# Patient Record
Sex: Male | Born: 1960
Health system: Southern US, Community
[De-identification: ages and names within clinical notes are randomized; demographics above are authoritative.]

## PROBLEM LIST (undated history)

## (undated) DIAGNOSIS — D751 Secondary polycythemia: Secondary | ICD-10-CM

## (undated) DIAGNOSIS — I10 Essential (primary) hypertension: Secondary | ICD-10-CM

## (undated) DIAGNOSIS — E119 Type 2 diabetes mellitus without complications: Secondary | ICD-10-CM

## (undated) DIAGNOSIS — E785 Hyperlipidemia, unspecified: Secondary | ICD-10-CM

## (undated) HISTORY — DX: Essential (primary) hypertension: I10

## (undated) HISTORY — DX: Secondary polycythemia: D75.1

## (undated) HISTORY — DX: Hyperlipidemia, unspecified: E78.5

## (undated) HISTORY — DX: Type 2 diabetes mellitus without complications: E11.9

---

## 2003-01-06 ENCOUNTER — Encounter: Admission: RE | Admit: 2003-01-06 | Discharge: 2003-01-06 | Payer: Self-pay | Admitting: Family Medicine

## 2004-10-27 ENCOUNTER — Ambulatory Visit: Payer: Self-pay | Admitting: Family Medicine

## 2004-12-20 ENCOUNTER — Ambulatory Visit: Payer: Self-pay | Admitting: Family Medicine

## 2005-01-10 ENCOUNTER — Ambulatory Visit: Payer: Self-pay | Admitting: Family Medicine

## 2005-02-08 ENCOUNTER — Ambulatory Visit: Payer: Self-pay | Admitting: Family Medicine

## 2005-04-25 ENCOUNTER — Ambulatory Visit: Payer: Self-pay | Admitting: Family Medicine

## 2005-05-02 ENCOUNTER — Ambulatory Visit: Payer: Self-pay | Admitting: Family Medicine

## 2006-05-22 ENCOUNTER — Ambulatory Visit: Payer: Self-pay | Admitting: Family Medicine

## 2006-05-22 LAB — CONVERTED CEMR LAB
ALT: 43 units/L — ABNORMAL HIGH (ref 0–40)
AST: 30 units/L (ref 0–37)
Albumin: 4.1 g/dL (ref 3.5–5.2)
Alkaline Phosphatase: 51 units/L (ref 39–117)
BUN: 16 mg/dL (ref 6–23)
Basophils Absolute: 0 10*3/uL (ref 0.0–0.1)
Basophils Relative: 0.6 % (ref 0.0–1.0)
Bilirubin, Direct: 0.1 mg/dL (ref 0.0–0.3)
CO2: 35 meq/L — ABNORMAL HIGH (ref 19–32)
Calcium: 9.4 mg/dL (ref 8.4–10.5)
Chloride: 103 meq/L (ref 96–112)
Cholesterol: 186 mg/dL (ref 0–200)
Creatinine, Ser: 1.1 mg/dL (ref 0.4–1.5)
Direct LDL: 102.3 mg/dL
Eosinophils Absolute: 0.1 10*3/uL (ref 0.0–0.6)
Eosinophils Relative: 1.7 % (ref 0.0–5.0)
GFR calc Af Amer: 93 mL/min
GFR calc non Af Amer: 77 mL/min
Glucose, Bld: 128 mg/dL — ABNORMAL HIGH (ref 70–99)
HCT: 50.9 % (ref 39.0–52.0)
HDL: 27.8 mg/dL — ABNORMAL LOW (ref >39.0)
Hemoglobin: 17.6 g/dL — ABNORMAL HIGH (ref 13.0–17.0)
Lymphocytes Relative: 23.8 % (ref 12.0–46.0)
MCHC: 34.5 g/dL (ref 30.0–36.0)
MCV: 94.1 fL (ref 78.0–100.0)
Monocytes Absolute: 0.7 10*3/uL (ref 0.2–0.7)
Monocytes Relative: 9.7 % (ref 3.0–11.0)
Neutro Abs: 4.5 10*3/uL (ref 1.4–7.7)
Neutrophils Relative %: 64.2 % (ref 43.0–77.0)
Platelets: 208 10*3/uL (ref 150–400)
Potassium: 3.7 meq/L (ref 3.5–5.1)
RBC: 5.41 M/uL (ref 4.22–5.81)
RDW: 11.5 % (ref 11.5–14.6)
Sodium: 143 meq/L (ref 135–145)
TSH: 1.21 microintl units/mL (ref 0.35–5.50)
Total Bilirubin: 0.9 mg/dL (ref 0.3–1.2)
Total CHOL/HDL Ratio: 6.7
Total Protein: 7.1 g/dL (ref 6.0–8.3)
Triglycerides: 380 mg/dL (ref 0–149)
VLDL: 76 mg/dL — ABNORMAL HIGH (ref 0–40)
WBC: 7 10*3/uL (ref 4.5–10.5)

## 2006-05-29 ENCOUNTER — Ambulatory Visit: Payer: Self-pay | Admitting: Family Medicine

## 2006-07-25 ENCOUNTER — Ambulatory Visit: Payer: Self-pay | Admitting: Family Medicine

## 2006-07-25 LAB — CONVERTED CEMR LAB
BUN: 16 mg/dL (ref 6–23)
CO2: 35 meq/L — ABNORMAL HIGH (ref 19–32)
Calcium: 9.5 mg/dL (ref 8.4–10.5)
Chloride: 102 meq/L (ref 96–112)
Cholesterol: 209 mg/dL (ref 0–200)
Creatinine, Ser: 1 mg/dL (ref 0.4–1.5)
Creatinine,U: 229.8 mg/dL
Direct LDL: 108.6 mg/dL
GFR calc Af Amer: 103 mL/min
GFR calc non Af Amer: 86 mL/min
Glucose, Bld: 102 mg/dL — ABNORMAL HIGH (ref 70–99)
HDL: 26.8 mg/dL — ABNORMAL LOW (ref >39.0)
Hgb A1c MFr Bld: 5.6 % (ref 4.6–6.0)
Microalb Creat Ratio: 3.5 mg/g (ref 0.0–30.0)
Microalb, Ur: 0.8 mg/dL (ref 0.0–1.9)
Potassium: 3.4 meq/L — ABNORMAL LOW (ref 3.5–5.1)
Sodium: 143 meq/L (ref 135–145)
Total CHOL/HDL Ratio: 7.8
Triglycerides: 320 mg/dL (ref 0–149)
VLDL: 64 mg/dL — ABNORMAL HIGH (ref 0–40)

## 2006-09-14 DIAGNOSIS — F17200 Nicotine dependence, unspecified, uncomplicated: Secondary | ICD-10-CM | POA: Insufficient documentation

## 2006-09-14 DIAGNOSIS — I10 Essential (primary) hypertension: Secondary | ICD-10-CM

## 2006-09-14 DIAGNOSIS — D751 Secondary polycythemia: Secondary | ICD-10-CM | POA: Insufficient documentation

## 2006-09-14 HISTORY — DX: Secondary polycythemia: D75.1

## 2006-09-14 HISTORY — DX: Essential (primary) hypertension: I10

## 2006-09-20 ENCOUNTER — Ambulatory Visit: Payer: Self-pay | Admitting: Family Medicine

## 2006-09-20 LAB — CONVERTED CEMR LAB
Cholesterol: 191 mg/dL (ref 0–200)
Direct LDL: 130.1 mg/dL
HDL: 22.5 mg/dL — ABNORMAL LOW (ref >39.0)
Total CHOL/HDL Ratio: 8.5
Triglycerides: 236 mg/dL (ref 0–149)
VLDL: 47 mg/dL — ABNORMAL HIGH (ref 0–40)

## 2006-12-25 ENCOUNTER — Telehealth: Payer: Self-pay | Admitting: Family Medicine

## 2007-05-24 ENCOUNTER — Ambulatory Visit: Payer: Self-pay | Admitting: Family Medicine

## 2007-05-24 LAB — CONVERTED CEMR LAB
ALT: 50 units/L (ref 0–53)
AST: 33 units/L (ref 0–37)
Albumin: 4 g/dL (ref 3.5–5.2)
Alkaline Phosphatase: 48 units/L (ref 39–117)
BUN: 14 mg/dL (ref 6–23)
Basophils Absolute: 0 10*3/uL (ref 0.0–0.1)
Basophils Relative: 0 % (ref 0.0–1.0)
Bilirubin Urine: NEGATIVE
Bilirubin, Direct: 0.1 mg/dL (ref 0.0–0.3)
CO2: 32 meq/L (ref 19–32)
Calcium: 9.4 mg/dL (ref 8.4–10.5)
Chloride: 101 meq/L (ref 96–112)
Cholesterol: 210 mg/dL (ref 0–200)
Creatinine, Ser: 1 mg/dL (ref 0.4–1.5)
Direct LDL: 113.1 mg/dL
Eosinophils Absolute: 0.2 10*3/uL (ref 0.0–0.7)
Eosinophils Relative: 2.6 % (ref 0.0–5.0)
GFR calc Af Amer: 103 mL/min
GFR calc non Af Amer: 85 mL/min
Glucose, Bld: 115 mg/dL — ABNORMAL HIGH (ref 70–99)
HCT: 46.7 % (ref 39.0–52.0)
HDL: 23.9 mg/dL — ABNORMAL LOW (ref >39.0)
Hemoglobin: 16.2 g/dL (ref 13.0–17.0)
Ketones, urine, test strip: NEGATIVE
Lymphocytes Relative: 31.4 % (ref 12.0–46.0)
MCHC: 34.7 g/dL (ref 30.0–36.0)
MCV: 96.5 fL (ref 78.0–100.0)
Monocytes Absolute: 0.6 10*3/uL (ref 0.1–1.0)
Monocytes Relative: 10.2 % (ref 3.0–12.0)
Neutro Abs: 3.2 10*3/uL (ref 1.4–7.7)
Neutrophils Relative %: 55.8 % (ref 43.0–77.0)
Nitrite: NEGATIVE
Platelets: 217 10*3/uL (ref 150–400)
Potassium: 3.1 meq/L — ABNORMAL LOW (ref 3.5–5.1)
Protein, U semiquant: NEGATIVE
RBC: 4.84 M/uL (ref 4.22–5.81)
RDW: 11.7 % (ref 11.5–14.6)
Sodium: 140 meq/L (ref 135–145)
Specific Gravity, Urine: >=1.03
TSH: 1.6 microintl units/mL (ref 0.35–5.50)
Total Bilirubin: 0.8 mg/dL (ref 0.3–1.2)
Total CHOL/HDL Ratio: 8.8
Total Protein: 7 g/dL (ref 6.0–8.3)
Triglycerides: 491 mg/dL (ref 0–149)
Urobilinogen, UA: 1
VLDL: 98 mg/dL — ABNORMAL HIGH (ref 0–40)
WBC Urine, dipstick: NEGATIVE
WBC: 5.9 10*3/uL (ref 4.5–10.5)
pH: 5.5

## 2007-06-25 ENCOUNTER — Encounter: Payer: Self-pay | Admitting: Family Medicine

## 2007-06-26 ENCOUNTER — Ambulatory Visit: Payer: Self-pay | Admitting: Family Medicine

## 2007-06-26 DIAGNOSIS — M2550 Pain in unspecified joint: Secondary | ICD-10-CM | POA: Insufficient documentation

## 2007-06-26 DIAGNOSIS — M25519 Pain in unspecified shoulder: Secondary | ICD-10-CM | POA: Insufficient documentation

## 2007-09-26 ENCOUNTER — Telehealth: Payer: Self-pay | Admitting: Family Medicine

## 2007-10-19 ENCOUNTER — Ambulatory Visit: Payer: Self-pay | Admitting: Family Medicine

## 2007-10-19 LAB — CONVERTED CEMR LAB
Cholesterol: 183 mg/dL (ref 0–200)
Direct LDL: 123.3 mg/dL
HDL: 20.9 mg/dL — ABNORMAL LOW (ref >39.0)
Total CHOL/HDL Ratio: 8.8
Triglycerides: 219 mg/dL (ref 0–149)
VLDL: 44 mg/dL — ABNORMAL HIGH (ref 0–40)

## 2007-10-25 ENCOUNTER — Ambulatory Visit: Payer: Self-pay | Admitting: Family Medicine

## 2007-10-25 DIAGNOSIS — E785 Hyperlipidemia, unspecified: Secondary | ICD-10-CM | POA: Insufficient documentation

## 2008-02-15 ENCOUNTER — Ambulatory Visit: Payer: Self-pay | Admitting: Family Medicine

## 2008-02-15 LAB — CONVERTED CEMR LAB
Basophils Absolute: 0.1 10*3/uL (ref 0.0–0.1)
Basophils Relative: 1 % (ref 0.0–3.0)
Cholesterol: 207 mg/dL (ref 0–200)
Direct LDL: 146.3 mg/dL
Eosinophils Absolute: 0.2 10*3/uL (ref 0.0–0.7)
Eosinophils Relative: 2.9 % (ref 0.0–5.0)
HCT: 48 % (ref 39.0–52.0)
HDL: 23.2 mg/dL — ABNORMAL LOW (ref >39.0)
Hemoglobin: 16.7 g/dL (ref 13.0–17.0)
Lymphocytes Relative: 28.1 % (ref 12.0–46.0)
MCHC: 34.7 g/dL (ref 30.0–36.0)
MCV: 94.5 fL (ref 78.0–100.0)
Monocytes Absolute: 0.5 10*3/uL (ref 0.1–1.0)
Monocytes Relative: 8.6 % (ref 3.0–12.0)
Neutro Abs: 3.7 10*3/uL (ref 1.4–7.7)
Neutrophils Relative %: 59.4 % (ref 43.0–77.0)
Platelets: 206 10*3/uL (ref 150–400)
RBC: 5.07 M/uL (ref 4.22–5.81)
RDW: 11.5 % (ref 11.5–14.6)
Total CHOL/HDL Ratio: 8.9
Triglycerides: 246 mg/dL (ref 0–149)
VLDL: 49 mg/dL — ABNORMAL HIGH (ref 0–40)
WBC: 6.2 10*3/uL (ref 4.5–10.5)

## 2008-02-22 ENCOUNTER — Ambulatory Visit: Payer: Self-pay | Admitting: Family Medicine

## 2008-03-31 ENCOUNTER — Telehealth: Payer: Self-pay | Admitting: Family Medicine

## 2008-06-27 ENCOUNTER — Ambulatory Visit: Payer: Self-pay | Admitting: Family Medicine

## 2008-06-27 LAB — CONVERTED CEMR LAB
ALT: 55 units/L — ABNORMAL HIGH (ref 0–53)
AST: 36 units/L (ref 0–37)
Albumin: 4.1 g/dL (ref 3.5–5.2)
Alkaline Phosphatase: 45 units/L (ref 39–117)
BUN: 17 mg/dL (ref 6–23)
Basophils Absolute: 0 10*3/uL (ref 0.0–0.1)
Basophils Relative: 0.6 % (ref 0.0–3.0)
Bilirubin Urine: NEGATIVE
Bilirubin, Direct: 0.1 mg/dL (ref 0.0–0.3)
Blood in Urine, dipstick: NEGATIVE
CO2: 30 meq/L (ref 19–32)
Calcium: 8.9 mg/dL (ref 8.4–10.5)
Chloride: 105 meq/L (ref 96–112)
Cholesterol: 182 mg/dL (ref 0–200)
Creatinine, Ser: 1.2 mg/dL (ref 0.4–1.5)
Direct LDL: 128.1 mg/dL
Eosinophils Absolute: 0.2 10*3/uL (ref 0.0–0.7)
Eosinophils Relative: 3.7 % (ref 0.0–5.0)
GFR calc non Af Amer: 68.5 mL/min (ref >60)
Glucose, Bld: 107 mg/dL — ABNORMAL HIGH (ref 70–99)
Glucose, Urine, Semiquant: NEGATIVE
HCT: 46.2 % (ref 39.0–52.0)
HDL: 22.9 mg/dL — ABNORMAL LOW (ref >39.00)
Hemoglobin: 16.4 g/dL (ref 13.0–17.0)
Lymphocytes Relative: 29.6 % (ref 12.0–46.0)
Lymphs Abs: 1.7 10*3/uL (ref 0.7–4.0)
MCHC: 35.5 g/dL (ref 30.0–36.0)
MCV: 94.9 fL (ref 78.0–100.0)
Monocytes Absolute: 0.6 10*3/uL (ref 0.1–1.0)
Monocytes Relative: 10.2 % (ref 3.0–12.0)
Neutro Abs: 3.1 10*3/uL (ref 1.4–7.7)
Neutrophils Relative %: 55.9 % (ref 43.0–77.0)
Nitrite: NEGATIVE
PSA: 0.48 ng/mL (ref 0.10–4.00)
Platelets: 199 10*3/uL (ref 150.0–400.0)
Potassium: 3.2 meq/L — ABNORMAL LOW (ref 3.5–5.1)
RBC: 4.86 M/uL (ref 4.22–5.81)
RDW: 11.7 % (ref 11.5–14.6)
Sodium: 142 meq/L (ref 135–145)
Specific Gravity, Urine: 1.025
TSH: 1.33 microintl units/mL (ref 0.35–5.50)
Total Bilirubin: 0.7 mg/dL (ref 0.3–1.2)
Total CHOL/HDL Ratio: 8
Total Protein: 6.7 g/dL (ref 6.0–8.3)
Triglycerides: 232 mg/dL — ABNORMAL HIGH (ref 0.0–149.0)
Urobilinogen, UA: 1
VLDL: 46.4 mg/dL — ABNORMAL HIGH (ref 0.0–40.0)
WBC Urine, dipstick: NEGATIVE
WBC: 5.6 10*3/uL (ref 4.5–10.5)
pH: 5.5

## 2008-07-04 ENCOUNTER — Ambulatory Visit: Payer: Self-pay | Admitting: Family Medicine

## 2008-07-04 DIAGNOSIS — L600 Ingrowing nail: Secondary | ICD-10-CM | POA: Insufficient documentation

## 2008-07-08 ENCOUNTER — Telehealth: Payer: Self-pay | Admitting: *Deleted

## 2008-07-30 ENCOUNTER — Telehealth: Payer: Self-pay | Admitting: Family Medicine

## 2008-09-04 ENCOUNTER — Emergency Department (HOSPITAL_BASED_OUTPATIENT_CLINIC_OR_DEPARTMENT_OTHER): Admission: EM | Admit: 2008-09-04 | Discharge: 2008-09-04 | Payer: Self-pay | Admitting: Emergency Medicine

## 2008-09-04 ENCOUNTER — Ambulatory Visit: Payer: Self-pay | Admitting: Radiology

## 2008-09-04 ENCOUNTER — Telehealth: Payer: Self-pay | Admitting: Family Medicine

## 2009-02-27 ENCOUNTER — Telehealth: Payer: Self-pay | Admitting: Family Medicine

## 2009-04-09 ENCOUNTER — Telehealth: Payer: Self-pay | Admitting: Family Medicine

## 2009-06-30 ENCOUNTER — Ambulatory Visit: Payer: Self-pay | Admitting: Family Medicine

## 2009-06-30 LAB — CONVERTED CEMR LAB
ALT: 36 units/L (ref 0–53)
AST: 23 units/L (ref 0–37)
Albumin: 4.2 g/dL (ref 3.5–5.2)
Alkaline Phosphatase: 46 units/L (ref 39–117)
BUN: 17 mg/dL (ref 6–23)
Basophils Absolute: 0 10*3/uL (ref 0.0–0.1)
Basophils Relative: 0.7 % (ref 0.0–3.0)
Bilirubin Urine: NEGATIVE
Bilirubin, Direct: 0.1 mg/dL (ref 0.0–0.3)
Blood in Urine, dipstick: NEGATIVE
CO2: 31 meq/L (ref 19–32)
Calcium: 9.1 mg/dL (ref 8.4–10.5)
Chloride: 102 meq/L (ref 96–112)
Cholesterol: 188 mg/dL (ref 0–200)
Creatinine, Ser: 1 mg/dL (ref 0.4–1.5)
Direct LDL: 132.1 mg/dL
Eosinophils Absolute: 0.3 10*3/uL (ref 0.0–0.7)
Eosinophils Relative: 4.6 % (ref 0.0–5.0)
GFR calc non Af Amer: 88.26 mL/min (ref >60)
Glucose, Bld: 106 mg/dL — ABNORMAL HIGH (ref 70–99)
Glucose, Urine, Semiquant: NEGATIVE
HCT: 48.5 % (ref 39.0–52.0)
HDL: 28.3 mg/dL — ABNORMAL LOW (ref >39.00)
Hemoglobin: 16.8 g/dL (ref 13.0–17.0)
Ketones, urine, test strip: NEGATIVE
Lymphocytes Relative: 28.8 % (ref 12.0–46.0)
Lymphs Abs: 1.7 10*3/uL (ref 0.7–4.0)
MCHC: 34.8 g/dL (ref 30.0–36.0)
MCV: 95.3 fL (ref 78.0–100.0)
Monocytes Absolute: 0.6 10*3/uL (ref 0.1–1.0)
Monocytes Relative: 10.1 % (ref 3.0–12.0)
Neutro Abs: 3.4 10*3/uL (ref 1.4–7.7)
Neutrophils Relative %: 55.8 % (ref 43.0–77.0)
Nitrite: NEGATIVE
PSA: 0.78 ng/mL (ref 0.10–4.00)
Platelets: 210 10*3/uL (ref 150.0–400.0)
Potassium: 3.7 meq/L (ref 3.5–5.1)
RBC: 5.09 M/uL (ref 4.22–5.81)
RDW: 12.7 % (ref 11.5–14.6)
Sodium: 139 meq/L (ref 135–145)
Specific Gravity, Urine: 1.025
TSH: 1.77 microintl units/mL (ref 0.35–5.50)
Total Bilirubin: 0.8 mg/dL (ref 0.3–1.2)
Total CHOL/HDL Ratio: 7
Total Protein: 6.7 g/dL (ref 6.0–8.3)
Triglycerides: 260 mg/dL — ABNORMAL HIGH (ref 0.0–149.0)
Urobilinogen, UA: 1
VLDL: 52 mg/dL — ABNORMAL HIGH (ref 0.0–40.0)
WBC Urine, dipstick: NEGATIVE
WBC: 6 10*3/uL (ref 4.5–10.5)
pH: 6.5

## 2009-07-10 ENCOUNTER — Ambulatory Visit: Payer: Self-pay | Admitting: Family Medicine

## 2010-03-09 NOTE — Assessment & Plan Note (Signed)
Summary: CPX // RS/PT [facility] FROM BMP/C[facility]   Vital Signs:  Patient profile:   50 year old male Height:      69 inches Weight:      232 pounds BMI:     34.38 Temp:     99.0 degrees F oral BP sitting:   140 / 90  (left arm) Cuff size:   regular  Vitals Entered By: Kern Reap CMA Duncan Dull) (July 10, 2009 10:40 AM) CC: cpx   CC:  cpx.  History of Present Illness: Ralph Hurst is a 50 year old, married male, smoker........... one half pack of cigarettes per day....... who comes in today for evaluation of hypertension, hyperlipidemia, and tobacco abuse.  His hypertension is treated with clonidine, .1 daily, Tenormin, 100 mg daily, Dyazide, one daily.  BP today 140/90.  He will do some blood pressure monitoring at home to be sure his blood pressure is normal.  He also takes Lopid 600 mg daily for hypertriglyceridemia.  He tried the chantix program to stop smoking.  However, the medication gave him nightmares.  We discussed for his options, including cutting the dose way back and taking it every other day.  Review of systems negative.  Tetanus booster 2007  Allergies (verified): No Known Drug Allergies  Past History:  Past medical, surgical, family and social histories (including risk factors) reviewed, and no changes noted (except as noted below).  Past Medical History: Reviewed history from 10/25/2007 and no changes required. Hypertension tobacco abuse polycythemia  low HDL syndrome, high triglyceride  Family History: Reviewed history from 06/26/2007 and no changes required.  father has hypertension mother has diabetes, hypertension, and late onset asthmaone brother one sister, both in good health  Social History: Reviewed history from 06/26/2007 and no changes required. Occupation: Married Current Smoker Alcohol use-no Drug use-no Regular exercise-yes originally from [place], [place] married children are grown and gone.  There are two children  Review of Systems  See HPI  Physical Exam  General:  Well-developed,well-nourished,in no acute distress; alert,appropriate and cooperative throughout examination Head:  Normocephalic and atraumatic without obvious abnormalities. No apparent alopecia or balding. Eyes:  No corneal or conjunctival inflammation noted. EOMI. Perrla. Funduscopic exam benign, without hemorrhages, exudates or papilledema. Vision grossly normal. Ears:  External ear exam shows no significant lesions or deformities.  Otoscopic examination reveals clear canals, tympanic membranes are intact bilaterally without bulging, retraction, inflammation or discharge. Hearing is grossly normal bilaterally. Nose:  External nasal examination shows no deformity or inflammation. Nasal mucosa are pink and moist without lesions or exudates. Mouth:  Oral mucosa and oropharynx without lesions or exudates.  Teeth in good repair. Neck:  No deformities, masses, or tenderness noted. Chest Wall:  No deformities, masses, tenderness or gynecomastia noted. Breasts:  No masses or gynecomastia noted Lungs:  Normal respiratory effort, chest expands symmetrically. Lungs are clear to auscultation, no crackles or wheezes. Heart:  Normal rate and regular rhythm. S1 and S2 normal without gallop, murmur, click, rub or other extra sounds. Abdomen:  Bowel sounds positive,abdomen soft and non-tender without masses, organomegaly or hernias noted. Rectal:  No external abnormalities noted. Normal sphincter tone. No rectal masses or tenderness. Genitalia:  Testes bilaterally descended without nodularity, tenderness or masses. No scrotal masses or lesions. No penis lesions or urethral discharge. Prostate:  Prostate gland firm and smooth, no enlargement, nodularity, tenderness, mass, asymmetry or induration. Msk:  No deformity or scoliosis noted of thoracic or lumbar spine.   Pulses:  R and L carotid,radial,femoral,dorsalis pedis and  posterior tibial pulses are full and equal  bilaterally Extremities:  No clubbing, cyanosis, edema, or deformity noted with normal full range of motion of all joints.   Neurologic:  No cranial nerve deficits noted. Station and gait are normal. Plantar reflexes are down-going bilaterally. DTRs are symmetrical throughout. Sensory, motor and coordinative functions appear intact. Skin:  Intact without suspicious lesions or rashes Cervical Nodes:  No lymphadenopathy noted Axillary Nodes:  No palpable lymphadenopathy Inguinal Nodes:  No significant adenopathy Psych:  Cognition and judgment appear intact. Alert and cooperative with normal attention span and concentration. No apparent delusions, illusions, hallucinations   Impression & Recommendations:  Problem # 1:  PHYSICAL EXAMINATION (ICD-V70.0) Assessment Unchanged  Orders: Prescription Created Electronically [phone])  Problem # 2:  TOBACCO ABUSE (ICD-305.1) Assessment: Unchanged  Orders: Prescription Created Electronically [id number])  Problem # 3:  HYPERLIPIDEMIA, WITH LOW HDL (ICD-272.4) Assessment: Unchanged  The following medications were removed from the medication list:    Trilipix 135 Mg Cpdr (Choline fenofibrate) .Marland Kitchen... Take 1 tablet by mouth every morning His updated medication list for this problem includes:    Lopid 600 Mg Tabs (Gemfibrozil) .Marland Kitchen... Take one tab two times a day  Orders: Prescription Created Electronically [phone]) EKG w/ Interpretation (93000)  Problem # 4:  HYPERTENSION (ICD-401.9) Assessment: Improved  His updated medication list for this problem includes:    Clonidine Hcl 0.1 Mg Tabs (Clonidine hcl) .Marland Kitchen... Take one every morning    Tenormin 100 Mg Tabs (Atenolol) .Marland Kitchen... Take 1 tablet by mouth every morning    Dyazide 37.5-25 Mg Caps (Triamterene-hctz) .Marland Kitchen... Take one tab once daily  Orders: Prescription Created Electronically [phone]) EKG w/ Interpretation (93000)  Complete Medication List: 1)  Clonidine Hcl 0.1 Mg Tabs (Clonidine hcl) .... Take  one every morning 2)  Aspirin 81 Mg Tbec (Aspirin) .... Once daily 3)  Tenormin 100 Mg Tabs (Atenolol) .... Take 1 tablet by mouth every morning 4)  Dyazide 37.5-25 Mg Caps (Triamterene-hctz) .... Take one tab once daily 5)  Lopid 600 Mg Tabs (Gemfibrozil) .... Take one tab two times a day  Patient Instructions: 1)  continue your current medications.  Check your blood pressure daily in the morning for 3 weeks.  If the data shows your blood pressure is 135/85 or less than we are at goal.  If not bring the data and the device and come back for follow-up. 2)  Try to restart the chantix by taking a half a tablet at bedtime, Monday, Wednesday, Friday, for 4 to 6 weeks.  Call p.r.n. 3)  Please schedule a follow-up appointment in 1 year. 4)  Take an Aspirin every day. Prescriptions: LOPID 600 MG TABS (GEMFIBROZIL) take one tab two times a day  #180 x 3   Entered and Authorized by:   Roderick Pee MD   Signed by:   Roderick Pee MD on 07/10/2009   Method used:   Electronically to        [facility] # [id number]* (retail)       [address]       [place], [place]  [address]       Ph: [phone]       Fax: [phone]   RxID:   [id number] DYAZIDE 37.5-25 MG CAPS (TRIAMTERENE-HCTZ) take one tab once daily  #100 x 3   Entered and Authorized by:   Roderick Pee MD   Signed by:   Roderick Pee MD on 07/10/2009   Method used:  Electronically to        [facility] # [address] (retail)       [address]       [place], [place]  [address]       Ph: [phone]       Fax: [phone]   RxID:   [id number] TENORMIN 100 MG TABS (ATENOLOL) Take 1 tablet by mouth every morning  #100 x 3   Entered and Authorized by:   Roderick Pee MD   Signed by:   Roderick Pee MD on 07/10/2009   Method used:   Electronically to        [facility] # [phone]* (retail)       [address]       [place], [place]  [address]       Ph: [phone]       Fax:  [phone]   RxID:   [id number] CLONIDINE HCL 0.1 MG  TABS (CLONIDINE HCL) take one every morning  #100 x 3   Entered and Authorized by:   Roderick Pee MD   Signed by:   Roderick Pee MD on 07/10/2009   Method used:   Electronically to        [facility] # [address]* (retail)       [address]       [place], [place]  [address]       Ph: [phone]       Fax: [phone]   RxID:   [id number]

## 2010-03-09 NOTE — Progress Notes (Signed)
Summary: new rx  Phone Note Call from Patient   Caller: Patient Call For: Roderick Pee MD Reason for Call: Refill Medication Details for Reason: new rx Summary of Call: patient is calling because he has spoken with the pharmacy and lopid is a medication he would like to try for his lipids.  is this okay to fill? Initial call taken by: Kern Reap CMA Duncan Dull),  April 09, 2009 11:51 AM  Follow-up for Phone Call        okay for years supply........ check does Follow-up by: Roderick Pee MD,  April 09, 2009 12:45 PM    New/Updated Medications: LOPID 600 MG TABS (GEMFIBROZIL) take one tab two times a day Prescriptions: LOPID 600 MG TABS (GEMFIBROZIL) take one tab two times a day  #180 x 0   Entered by:   Kern Reap CMA (AAMA)   Authorized by:   Roderick Pee MD   Signed by:   Kern Reap CMA (AAMA) on 04/09/2009   Method used:   Telephoned to ...       [facility] # [address] (retail)       [address]       [place], [place]  [address]       Ph: [phone]       Fax: [phone]   RxID:   [id number]

## 2010-03-09 NOTE — Progress Notes (Signed)
Summary: change meds  Phone Note Call from Patient   Caller: Patient Call For: Roderick Pee MD Summary of Call: Pt. cannot afford the Trilipix without the discount cards.  What else can he take? [phone])  Initial call taken by: Lynann Beaver CMA,  February 27, 2009 8:55 AM  Follow-up for Phone Call        have patient call the pharmacy benefit network with his insurance to find out what medication is equivalent and how much it costs Follow-up by: Roderick Pee MD,  March 02, 2009 8:34 AM  Additional Follow-up for Phone Call Additional follow up Details #1::        Left message on pt's voice mail with Dr. Nelida Meuse suggestions. Additional Follow-up by: Lynann Beaver CMA,  March 02, 2009 10:56 AM

## 2010-06-20 ENCOUNTER — Other Ambulatory Visit: Payer: Self-pay | Admitting: Family Medicine

## 2010-07-01 ENCOUNTER — Other Ambulatory Visit (INDEPENDENT_AMBULATORY_CARE_PROVIDER_SITE_OTHER): Payer: BC Managed Care – PPO

## 2010-07-01 DIAGNOSIS — Z0000 Encounter for general adult medical examination without abnormal findings: Secondary | ICD-10-CM

## 2010-07-01 LAB — CBC WITH DIFFERENTIAL/PLATELET
Basophils Absolute: 0 10*3/uL (ref 0.0–0.1)
Basophils Relative: 0.8 % (ref 0.0–3.0)
Eosinophils Absolute: 0.2 10*3/uL (ref 0.0–0.7)
Eosinophils Relative: 4.2 % (ref 0.0–5.0)
HCT: 47.2 % (ref 39.0–52.0)
Hemoglobin: 16.4 g/dL (ref 13.0–17.0)
Lymphocytes Relative: 28.5 % (ref 12.0–46.0)
Lymphs Abs: 1.4 10*3/uL (ref 0.7–4.0)
MCHC: 34.7 g/dL (ref 30.0–36.0)
MCV: 96.4 fl (ref 78.0–100.0)
Monocytes Absolute: 0.7 10*3/uL (ref 0.1–1.0)
Monocytes Relative: 14.5 % — ABNORMAL HIGH (ref 3.0–12.0)
Neutro Abs: 2.5 10*3/uL (ref 1.4–7.7)
Neutrophils Relative %: 52 % (ref 43.0–77.0)
Platelets: 191 10*3/uL (ref 150.0–400.0)
RBC: 4.9 Mil/uL (ref 4.22–5.81)
RDW: 13 % (ref 11.5–14.6)
WBC: 4.9 10*3/uL (ref 4.5–10.5)

## 2010-07-01 LAB — POCT URINALYSIS DIPSTICK
Bilirubin, UA: NEGATIVE
Blood, UA: NEGATIVE
Glucose, UA: NEGATIVE
Ketones, UA: NEGATIVE
Leukocytes, UA: NEGATIVE
Nitrite, UA: NEGATIVE
Spec Grav, UA: 1.025
Urobilinogen, UA: 1
pH, UA: 7

## 2010-07-01 LAB — BASIC METABOLIC PANEL
BUN: 17 mg/dL (ref 6–23)
CO2: 29 mEq/L (ref 19–32)
Calcium: 9.2 mg/dL (ref 8.4–10.5)
Chloride: 105 mEq/L (ref 96–112)
Creatinine, Ser: 1.1 mg/dL (ref 0.4–1.5)
GFR: 76.73 mL/min (ref >60.00)
Glucose, Bld: 139 mg/dL — ABNORMAL HIGH (ref 70–99)
Potassium: 4.1 mEq/L (ref 3.5–5.1)
Sodium: 142 mEq/L (ref 135–145)

## 2010-07-01 LAB — LIPID PANEL
Cholesterol: 175 mg/dL (ref 0–200)
HDL: 30 mg/dL — ABNORMAL LOW (ref >39.00)
Total CHOL/HDL Ratio: 6
Triglycerides: 291 mg/dL — ABNORMAL HIGH (ref 0.0–149.0)
VLDL: 58.2 mg/dL — ABNORMAL HIGH (ref 0.0–40.0)

## 2010-07-01 LAB — HEPATIC FUNCTION PANEL
ALT: 62 U/L — ABNORMAL HIGH (ref 0–53)
AST: 39 U/L — ABNORMAL HIGH (ref 0–37)
Albumin: 4 g/dL (ref 3.5–5.2)
Alkaline Phosphatase: 43 U/L (ref 39–117)
Bilirubin, Direct: 0.1 mg/dL (ref 0.0–0.3)
Total Bilirubin: 0.8 mg/dL (ref 0.3–1.2)
Total Protein: 6.7 g/dL (ref 6.0–8.3)

## 2010-07-01 LAB — PSA: PSA: 0.63 ng/mL (ref 0.10–4.00)

## 2010-07-01 LAB — TSH: TSH: 1.48 u[IU]/mL (ref 0.35–5.50)

## 2010-07-02 LAB — LDL CHOLESTEROL, DIRECT: Direct LDL: 120.2 mg/dL

## 2010-07-15 ENCOUNTER — Encounter: Payer: Self-pay | Admitting: Family Medicine

## 2010-07-17 ENCOUNTER — Other Ambulatory Visit: Payer: Self-pay | Admitting: Family Medicine

## 2010-07-28 ENCOUNTER — Encounter: Payer: Self-pay | Admitting: Family Medicine

## 2010-07-29 ENCOUNTER — Encounter: Payer: Self-pay | Admitting: Family Medicine

## 2010-07-29 ENCOUNTER — Ambulatory Visit (INDEPENDENT_AMBULATORY_CARE_PROVIDER_SITE_OTHER): Payer: BC Managed Care – PPO | Admitting: Family Medicine

## 2010-07-29 DIAGNOSIS — I10 Essential (primary) hypertension: Secondary | ICD-10-CM

## 2010-07-29 DIAGNOSIS — F17200 Nicotine dependence, unspecified, uncomplicated: Secondary | ICD-10-CM

## 2010-07-29 DIAGNOSIS — E785 Hyperlipidemia, unspecified: Secondary | ICD-10-CM

## 2010-07-29 MED ORDER — GEMFIBROZIL 600 MG PO TABS: 600.0000 mg | ORAL_TABLET | Freq: Two times a day (BID) | ORAL | Status: DC

## 2010-07-29 MED ORDER — CLONIDINE HCL 0.1 MG PO TABS: 0.1000 mg | ORAL_TABLET | ORAL | Status: DC

## 2010-07-29 MED ORDER — ATENOLOL 100 MG PO TABS: 100.0000 mg | ORAL_TABLET | Freq: Every day | ORAL | Status: DC

## 2010-07-29 MED ORDER — TRIAMTERENE-HCTZ 37.5-25 MG PO CAPS: 1.0000 | ORAL_CAPSULE | ORAL | Status: DC

## 2010-07-29 NOTE — Progress Notes (Signed)
Subjective:    Patient ID: Ralph Hurst, male    DOB: [birth year], 50 y.o.   MRN: [id number]  HPI [name] Is a 50 year old,  married male smoker, who comes in today for general physical examination because of history of hypertension, hyperlipidemia, and tobacco abuse.  We tried the chantix program, however, it caused strange dreams, nightmares a lot of other CNS side effects.  Advise try half a tablet Monday, Wednesday, Friday or switched to the patches or the gums.  He takes Tenormin 100 mg daily and Catapres .1 daily for hypertension.  BP good 130/90.  He takes Lopid 600 mg b.i.d. For hyperlipidemia lipids are okay except his HDL is low.  Advise fish oil, and exercise.  He has a bump on his left lower eyelid.  A couple weeks ago.  He had some flank pain that lasted for a day or two and then went away.  He does not get routine eye care and her general care.  He is due  for a screening colonoscopy.  Tetanus 2007   Review of Systems  Constitutional: Negative.   HENT: Negative.   Eyes: Negative.   Respiratory: Negative.   Cardiovascular: Negative.   Gastrointestinal: Negative.   Genitourinary: Negative.   Musculoskeletal: Negative.   Skin: Negative.   Neurological: Negative.   Hematological: Negative.   Psychiatric/Behavioral: Negative.        Objective:   Physical Exam  Constitutional: He is oriented to person, place, and time. He appears well-developed and well-nourished.  HENT:  Head: Normocephalic and atraumatic.  Right Ear: External ear normal.  Left Ear: External ear normal.  Nose: Nose normal.  Mouth/Throat: Oropharynx is clear and moist.  Eyes: Conjunctivae and EOM are normal. Pupils are equal, round, and reactive to light.  Neck: Normal range of motion. Neck supple. No JVD present. No tracheal deviation present. No thyromegaly present.  Cardiovascular: Normal rate, regular rhythm, normal heart sounds and intact distal pulses.  Exam reveals no gallop and no  friction rub.   No murmur heard. Pulmonary/Chest: Effort normal and breath sounds normal. No stridor. No respiratory distress. He has no wheezes. He has no rales. He exhibits no tenderness.  Abdominal: Soft. Bowel sounds are normal. He exhibits no distension and no mass. There is no tenderness. There is no rebound and no guarding.  Genitourinary: Rectum normal, prostate normal and penis normal. Guaiac negative stool. No penile tenderness.  Musculoskeletal: Normal range of motion. He exhibits no edema and no tenderness.  Lymphadenopathy:    He has no cervical adenopathy.  Neurological: He is alert and oriented to person, place, and time. He has normal reflexes. No cranial nerve deficit. He exhibits normal muscle tone.  Skin: Skin is warm and dry. No rash noted. No erythema. No pallor.  Psychiatric: He has a normal mood and affect. His behavior is normal. Judgment and thought content normal.          Assessment & Plan:  Hypertension continue current medications  Hyperlipidemia.  Continue Lopid, 600 b.i.d., an aspirin tablet daily.  Two lazy in left eye observed.  Ophthalmology consult.  Tobacco abuse advised different strategies to stop smoking

## 2010-07-29 NOTE — Patient Instructions (Signed)
Continue your current medications.  Retry the . chantix program by taking a half a tablet Monday, Wednesday, Friday if you still have significant side effects, then switched to the nicotine gum or the nicotine patches Return in one year, sooner if any problems.  If the lesion in y eye  does not heal, then I would recommend Dr. Mia Creek, ophthalmologist.  Also, recommend Dr. Alvester Morin ........... dentist

## 2010-09-13 ENCOUNTER — Other Ambulatory Visit: Payer: Self-pay | Admitting: Family Medicine

## 2010-09-27 ENCOUNTER — Encounter: Payer: Self-pay | Admitting: Gastroenterology

## 2011-03-06 ENCOUNTER — Other Ambulatory Visit: Payer: Self-pay | Admitting: Family Medicine

## 2011-06-05 ENCOUNTER — Other Ambulatory Visit: Payer: Self-pay | Admitting: Family Medicine

## 2011-07-30 ENCOUNTER — Other Ambulatory Visit: Payer: Self-pay | Admitting: Family Medicine

## 2011-07-31 ENCOUNTER — Other Ambulatory Visit: Payer: Self-pay | Admitting: Family Medicine

## 2011-10-04 ENCOUNTER — Other Ambulatory Visit (INDEPENDENT_AMBULATORY_CARE_PROVIDER_SITE_OTHER): Payer: BC Managed Care – PPO

## 2011-10-04 DIAGNOSIS — Z0000 Encounter for general adult medical examination without abnormal findings: Secondary | ICD-10-CM

## 2011-10-04 LAB — CBC WITH DIFFERENTIAL/PLATELET
Basophils Absolute: 0 10*3/uL (ref 0.0–0.1)
Basophils Relative: 0.5 % (ref 0.0–3.0)
Eosinophils Absolute: 0.2 10*3/uL (ref 0.0–0.7)
Eosinophils Relative: 2.7 % (ref 0.0–5.0)
HCT: 51 % (ref 39.0–52.0)
Hemoglobin: 17.2 g/dL — ABNORMAL HIGH (ref 13.0–17.0)
Lymphocytes Relative: 27.7 % (ref 12.0–46.0)
Lymphs Abs: 1.8 10*3/uL (ref 0.7–4.0)
MCHC: 33.6 g/dL (ref 30.0–36.0)
MCV: 97.2 fl (ref 78.0–100.0)
Monocytes Absolute: 0.7 10*3/uL (ref 0.1–1.0)
Monocytes Relative: 10.4 % (ref 3.0–12.0)
Neutro Abs: 3.9 10*3/uL (ref 1.4–7.7)
Neutrophils Relative %: 58.7 % (ref 43.0–77.0)
Platelets: 195 10*3/uL (ref 150.0–400.0)
RBC: 5.25 Mil/uL (ref 4.22–5.81)
RDW: 12.7 % (ref 11.5–14.6)
WBC: 6.6 10*3/uL (ref 4.5–10.5)

## 2011-10-04 LAB — PSA: PSA: 0.66 ng/mL (ref 0.10–4.00)

## 2011-10-04 LAB — POCT URINALYSIS DIPSTICK
Bilirubin, UA: NEGATIVE
Blood, UA: NEGATIVE
Glucose, UA: NEGATIVE
Ketones, UA: NEGATIVE
Leukocytes, UA: NEGATIVE
Nitrite, UA: NEGATIVE
Spec Grav, UA: 1.025
Urobilinogen, UA: 1
pH, UA: 6

## 2011-10-04 LAB — BASIC METABOLIC PANEL
BUN: 14 mg/dL (ref 6–23)
CO2: 31 mEq/L (ref 19–32)
Calcium: 9.3 mg/dL (ref 8.4–10.5)
Chloride: 101 mEq/L (ref 96–112)
Creatinine, Ser: 1 mg/dL (ref 0.4–1.5)
GFR: 86.42 mL/min (ref >60.00)
Glucose, Bld: 125 mg/dL — ABNORMAL HIGH (ref 70–99)
Potassium: 4.2 mEq/L (ref 3.5–5.1)
Sodium: 141 mEq/L (ref 135–145)

## 2011-10-04 LAB — LIPID PANEL
Cholesterol: 186 mg/dL (ref 0–200)
HDL: 29.5 mg/dL — ABNORMAL LOW (ref >39.00)
Total CHOL/HDL Ratio: 6
Triglycerides: 345 mg/dL — ABNORMAL HIGH (ref 0.0–149.0)
VLDL: 69 mg/dL — ABNORMAL HIGH (ref 0.0–40.0)

## 2011-10-04 LAB — HEPATIC FUNCTION PANEL
ALT: 43 U/L (ref 0–53)
AST: 24 U/L (ref 0–37)
Albumin: 4.2 g/dL (ref 3.5–5.2)
Alkaline Phosphatase: 52 U/L (ref 39–117)
Bilirubin, Direct: 0.1 mg/dL (ref 0.0–0.3)
Total Bilirubin: 0.7 mg/dL (ref 0.3–1.2)
Total Protein: 6.9 g/dL (ref 6.0–8.3)

## 2011-10-04 LAB — TSH: TSH: 1.41 u[IU]/mL (ref 0.35–5.50)

## 2011-10-07 ENCOUNTER — Other Ambulatory Visit: Payer: Self-pay | Admitting: Family Medicine

## 2011-10-12 ENCOUNTER — Ambulatory Visit (INDEPENDENT_AMBULATORY_CARE_PROVIDER_SITE_OTHER): Payer: BC Managed Care – PPO | Admitting: Family Medicine

## 2011-10-12 ENCOUNTER — Encounter: Payer: Self-pay | Admitting: Family Medicine

## 2011-10-12 VITALS — BP 120/88 | Temp 99.1°F | Ht 69.5 in | Wt 234.0 lb

## 2011-10-12 DIAGNOSIS — E785 Hyperlipidemia, unspecified: Secondary | ICD-10-CM

## 2011-10-12 DIAGNOSIS — D751 Secondary polycythemia: Secondary | ICD-10-CM

## 2011-10-12 DIAGNOSIS — F17200 Nicotine dependence, unspecified, uncomplicated: Secondary | ICD-10-CM

## 2011-10-12 DIAGNOSIS — I10 Essential (primary) hypertension: Secondary | ICD-10-CM

## 2011-10-12 MED ORDER — GEMFIBROZIL 600 MG PO TABS: 600.0000 mg | ORAL_TABLET | Freq: Two times a day (BID) | ORAL | Status: DC

## 2011-10-12 MED ORDER — CLONIDINE HCL 0.1 MG PO TABS: 0.1000 mg | ORAL_TABLET | ORAL | Status: DC

## 2011-10-12 MED ORDER — TRIAMTERENE-HCTZ 37.5-25 MG PO CAPS: 1.0000 | ORAL_CAPSULE | ORAL | Status: DC

## 2011-10-12 MED ORDER — ATENOLOL 100 MG PO TABS: 100.0000 mg | ORAL_TABLET | Freq: Every day | ORAL | Status: DC

## 2011-10-12 NOTE — Progress Notes (Signed)
Subjective:    Patient ID: Ralph Hurst, male    DOB: 1960-07-05, 51 y.o.   MRN: [id number]  HPI Edy is a 51 year old married male smoker one half pack per day who comes in today for general physical examination  We have tried the Chantix program however he has significant side effects and the Chantix even low dose every other day.  His blood pressure medications are listed and he is running 120/88  He also takes Lopid 600 mg twice a day for hyperlipidemia  Tetanus booster 2007  He declines a GI evaluation for colonoscopy because it's too expensive. He also declines dental caries says he can't afford it   Review of Systems  Constitutional: Negative.   HENT: Negative.   Eyes: Negative.   Respiratory: Negative.   Cardiovascular: Negative.   Gastrointestinal: Negative.   Genitourinary: Negative.   Musculoskeletal: Negative.   Skin: Negative.   Neurological: Negative.   Hematological: Negative.   Psychiatric/Behavioral: Negative.        Objective:   Physical Exam  Constitutional: He is oriented to person, place, and time. He appears well-developed and well-nourished.  HENT:  Head: Normocephalic and atraumatic.  Right Ear: External ear normal.  Left Ear: External ear normal.  Nose: Nose normal.  Mouth/Throat: Oropharynx is clear and moist.  Eyes: Conjunctivae and EOM are normal. Pupils are equal, round, and reactive to light.  Neck: Normal range of motion. Neck supple. No JVD present. No tracheal deviation present. No thyromegaly present.  Cardiovascular: Normal rate, regular rhythm, normal heart sounds and intact distal pulses.  Exam reveals no gallop and no friction rub.   No murmur heard. Pulmonary/Chest: Effort normal and breath sounds normal. No stridor. No respiratory distress. He has no wheezes. He has no rales. He exhibits no tenderness.  Abdominal: Soft. Bowel sounds are normal. He exhibits no distension and no mass. There is no tenderness. There is no rebound  and no guarding.  Genitourinary: Rectum normal, prostate normal and penis normal. Guaiac negative stool. No penile tenderness.  Musculoskeletal: Normal range of motion. He exhibits no edema and no tenderness.  Lymphadenopathy:    He has no cervical adenopathy.  Neurological: He is alert and oriented to person, place, and time. He has normal reflexes. No cranial nerve deficit. He exhibits normal muscle tone.  Skin: Skin is warm and dry. No rash noted. No erythema. No pallor.  Psychiatric: He has a normal mood and affect. His behavior is normal. Judgment and thought content normal.   Total body skin exam shows numerous freckles and moles all of which appear to be normal       Assessment & Plan:  Healthy male  Hypertension continue current medications  Hyperlipidemia continue current medications  Chronic sun damage again encourage sunscreens and monthly skin exam  Tobacco abuse we discussed treatment options again he has tried the Chantix program however he had significant side effects even on 0.5 mg Monday Wednesday Friday therefore we discussed the patches and the gum as an alternative way to stop smoking

## 2011-10-12 NOTE — Patient Instructions (Signed)
Continue your current medications  I will call GI and see if we can't find a more reasonable cheaper way to do your colonoscopy  Remember to do your skin exams monthly  Return in one year sooner if any problems

## 2011-10-22 ENCOUNTER — Other Ambulatory Visit: Payer: Self-pay | Admitting: Family Medicine

## 2012-01-08 ENCOUNTER — Other Ambulatory Visit: Payer: Self-pay | Admitting: Family Medicine

## 2012-10-09 ENCOUNTER — Other Ambulatory Visit: Payer: BC Managed Care – PPO

## 2012-10-15 ENCOUNTER — Encounter: Payer: BC Managed Care – PPO | Admitting: Family Medicine

## 2012-10-24 ENCOUNTER — Other Ambulatory Visit: Payer: Self-pay | Admitting: Family Medicine

## 2012-11-07 ENCOUNTER — Other Ambulatory Visit: Payer: Self-pay | Admitting: Family Medicine

## 2012-11-15 ENCOUNTER — Other Ambulatory Visit: Payer: Self-pay | Admitting: Family Medicine

## 2012-12-12 ENCOUNTER — Other Ambulatory Visit (INDEPENDENT_AMBULATORY_CARE_PROVIDER_SITE_OTHER): Payer: BC Managed Care – PPO

## 2012-12-12 DIAGNOSIS — E785 Hyperlipidemia, unspecified: Secondary | ICD-10-CM

## 2012-12-12 DIAGNOSIS — Z0000 Encounter for general adult medical examination without abnormal findings: Secondary | ICD-10-CM

## 2012-12-12 LAB — POCT URINALYSIS DIPSTICK
Bilirubin, UA: NEGATIVE
Leukocytes, UA: NEGATIVE
Spec Grav, UA: 1.025
pH, UA: 7

## 2012-12-12 LAB — CBC WITH DIFFERENTIAL/PLATELET
Basophils Absolute: 0 10*3/uL (ref 0.0–0.1)
Basophils Relative: 0.6 % (ref 0.0–3.0)
Eosinophils Relative: 3.2 % (ref 0.0–5.0)
Hemoglobin: 17 g/dL (ref 13.0–17.0)
Lymphocytes Relative: 24.1 % (ref 12.0–46.0)
MCHC: 34.5 g/dL (ref 30.0–36.0)
Monocytes Relative: 9.3 % (ref 3.0–12.0)
Neutro Abs: 3.9 10*3/uL (ref 1.4–7.7)
Neutrophils Relative %: 62.8 % (ref 43.0–77.0)
Platelets: 213 10*3/uL (ref 150.0–400.0)
RBC: 5.16 Mil/uL (ref 4.22–5.81)
RDW: 12.9 % (ref 11.5–14.6)
WBC: 6.2 10*3/uL (ref 4.5–10.5)

## 2012-12-12 LAB — BASIC METABOLIC PANEL
CO2: 31 mEq/L (ref 19–32)
Calcium: 9.5 mg/dL (ref 8.4–10.5)
Chloride: 98 mEq/L (ref 96–112)
Creatinine, Ser: 1 mg/dL (ref 0.4–1.5)
GFR: 88.12 mL/min (ref >60.00)
Sodium: 137 mEq/L (ref 135–145)

## 2012-12-12 LAB — HEPATIC FUNCTION PANEL
ALT: 72 U/L — ABNORMAL HIGH (ref 0–53)
AST: 43 U/L — ABNORMAL HIGH (ref 0–37)
Albumin: 4.2 g/dL (ref 3.5–5.2)
Alkaline Phosphatase: 47 U/L (ref 39–117)
Bilirubin, Direct: 0.1 mg/dL (ref 0.0–0.3)
Total Protein: 6.9 g/dL (ref 6.0–8.3)

## 2012-12-12 LAB — LIPID PANEL
HDL: 28.7 mg/dL — ABNORMAL LOW (ref >39.00)
Total CHOL/HDL Ratio: 7
Triglycerides: 280 mg/dL — ABNORMAL HIGH (ref 0.0–149.0)
VLDL: 56 mg/dL — ABNORMAL HIGH (ref 0.0–40.0)

## 2012-12-17 ENCOUNTER — Encounter: Payer: Self-pay | Admitting: Family Medicine

## 2012-12-17 ENCOUNTER — Ambulatory Visit (INDEPENDENT_AMBULATORY_CARE_PROVIDER_SITE_OTHER): Payer: BC Managed Care – PPO | Admitting: Family Medicine

## 2012-12-17 VITALS — BP 120/80 | Temp 98.5°F | Ht 69.75 in | Wt 238.0 lb

## 2012-12-17 DIAGNOSIS — I10 Essential (primary) hypertension: Secondary | ICD-10-CM

## 2012-12-17 DIAGNOSIS — G5602 Carpal tunnel syndrome, left upper limb: Secondary | ICD-10-CM

## 2012-12-17 DIAGNOSIS — E785 Hyperlipidemia, unspecified: Secondary | ICD-10-CM

## 2012-12-17 DIAGNOSIS — Z0000 Encounter for general adult medical examination without abnormal findings: Secondary | ICD-10-CM

## 2012-12-17 DIAGNOSIS — K625 Hemorrhage of anus and rectum: Secondary | ICD-10-CM | POA: Insufficient documentation

## 2012-12-17 DIAGNOSIS — G5600 Carpal tunnel syndrome, unspecified upper limb: Secondary | ICD-10-CM | POA: Insufficient documentation

## 2012-12-17 MED ORDER — GEMFIBROZIL 600 MG PO TABS: 600.0000 mg | ORAL_TABLET | Freq: Two times a day (BID) | ORAL | Status: DC

## 2012-12-17 MED ORDER — CLONIDINE HCL 0.1 MG PO TABS: 0.1000 mg | ORAL_TABLET | ORAL | Status: DC

## 2012-12-17 MED ORDER — TRIAMTERENE-HCTZ 37.5-25 MG PO CAPS: 1.0000 | ORAL_CAPSULE | ORAL | Status: DC

## 2012-12-17 MED ORDER — ATENOLOL 100 MG PO TABS: ORAL_TABLET | ORAL | Status: DC

## 2012-12-17 NOTE — Progress Notes (Signed)
Subjective:    Patient ID: Ralph Hurst, male    DOB: Dec 14, 1960, 52 y.o.   MRN: [id number]  HPI Ralph Hurst is a 52 year old married male smoker,,,,,, one half pack of cigarettes a day he tried the Chantix program but he had significant side effects,,,,,, who comes in today for general physical examination because of 52 year old married male smoker,,,,,, one half pack of cigarettes a day he tried the Chantix program but he had significant side effects,,,,,, who comes in today for general physical examination because of a history of tobacco abuse, hypertension, low HDL,  His obvious biggest long-term his factors tobacco abuse. We again discussed options. He's thinking about trying the electronic cigarette  His medications reviewed the been no changes BP normal 120/80  He gets routine eye care, no dental care, LDH that he needs a screening colonoscopy bowel habits normal  Vaccinations up-to-date   Review of Systems  Endocrine: Negative.   Allergic/Immunologic: Negative.   Hematological: Negative.        Objective:   Physical Exam  Nursing note and vitals reviewed. Constitutional: He is oriented to person, place, and time. He appears well-developed and well-nourished.  HENT:  Head: Normocephalic and atraumatic.  Right Ear: External ear normal.  Left Ear: External ear normal.  Nose: Nose normal.  Mouth/Throat: Oropharynx is clear and moist.  Eyes: Conjunctivae and EOM are normal. Pupils are equal, round, and reactive to light.  Neck: Normal range of motion. Neck supple. No JVD present. No tracheal deviation present. No thyromegaly present.  Cardiovascular: Normal rate, regular rhythm, normal heart sounds and intact distal pulses.  Exam reveals no gallop and no friction rub.   No murmur heard. No carotid or urinary bruits peripheral pulses 2+ and symmetrical  Pulmonary/Chest: Effort normal and breath sounds normal. No stridor. No respiratory distress. He has no wheezes. He has no rales. He exhibits no tenderness.  Abdominal: Soft. Bowel sounds are normal. He exhibits no distension and no mass. There is no tenderness. There is no rebound and no guarding.  Genitourinary: Prostate normal  and penis normal. Guaiac positive stool. No penile tenderness.  Musculoskeletal: Normal range of motion. He exhibits no edema and no tenderness.  Lymphadenopathy:    He has no cervical adenopathy.  Neurological: He is alert and oriented to person, place, and time. He has normal reflexes. No cranial nerve deficit. He exhibits normal muscle tone.  Skin: Skin is warm and dry. No rash noted. No erythema. No pallor.  Total body skin exam normal  Psychiatric: He has a normal mood and affect. His behavior is normal. Judgment and thought content normal.          Assessment & Plan:  Hypertension at goal continue current therapy  Low HDL the Lopid is not really doing any good therefore we discarded stop it  Tobacco abuse,,,,,,,,,, again outlined smoking cessation programs  Positive stool guaiac,,,,,,,,, refer to GI for colonoscopy

## 2012-12-17 NOTE — Progress Notes (Signed)
Pre visit review using our clinic review tool, if applicable. No additional management support is needed unless otherwise documented below in the visit note. 

## 2012-12-17 NOTE — Patient Instructions (Signed)
Try a did the nicotine gum or patches or the electronic cigarettes  Continue current medications except stop the a lopid   Return in one year sooner if any problems  We will get you set up a consult in GI to evaluate the positive stool guaiac

## 2012-12-19 ENCOUNTER — Telehealth: Payer: Self-pay | Admitting: *Deleted

## 2012-12-19 NOTE — Telephone Encounter (Signed)
Patient  Has stopped his Lopid, but would like to know if he should stop his Dyazide due to his feet swelling?

## 2012-12-21 NOTE — Telephone Encounter (Signed)
Left message on machine for patient to continue his Dyazide

## 2012-12-31 ENCOUNTER — Telehealth: Payer: Self-pay | Admitting: Family Medicine

## 2012-12-31 DIAGNOSIS — I10 Essential (primary) hypertension: Secondary | ICD-10-CM

## 2012-12-31 MED ORDER — CLONIDINE HCL 0.1 MG PO TABS: 0.1000 mg | ORAL_TABLET | Freq: Every day | ORAL | Status: DC

## 2012-12-31 NOTE — Telephone Encounter (Signed)
New Rx sent to pharmacy with clear directions

## 2012-12-31 NOTE — Telephone Encounter (Signed)
Phar has ? concerniing clonidine 0.1 mg

## 2013-01-18 ENCOUNTER — Encounter: Payer: Self-pay | Admitting: Family Medicine

## 2013-02-18 ENCOUNTER — Other Ambulatory Visit: Payer: Self-pay | Admitting: Family Medicine

## 2013-05-30 ENCOUNTER — Other Ambulatory Visit: Payer: Self-pay | Admitting: *Deleted

## 2013-05-30 ENCOUNTER — Ambulatory Visit (INDEPENDENT_AMBULATORY_CARE_PROVIDER_SITE_OTHER): Payer: 59 | Admitting: Family Medicine

## 2013-05-30 ENCOUNTER — Encounter: Payer: Self-pay | Admitting: Family Medicine

## 2013-05-30 VITALS — BP 130/90 | Temp 98.7°F | Wt 242.0 lb

## 2013-05-30 DIAGNOSIS — M109 Gout, unspecified: Secondary | ICD-10-CM

## 2013-05-30 LAB — URIC ACID: Uric Acid, Serum: 6.2 mg/dL (ref 4.0–7.8)

## 2013-05-30 MED ORDER — ALLOPURINOL 100 MG PO TABS: 100.0000 mg | ORAL_TABLET | Freq: Every day | ORAL | Status: DC

## 2013-05-30 MED ORDER — PREDNISONE 20 MG PO TABS: ORAL_TABLET | ORAL | Status: DC

## 2013-05-30 MED ORDER — TRAMADOL HCL 50 MG PO TABS: ORAL_TABLET | ORAL | Status: DC

## 2013-05-30 NOTE — Progress Notes (Signed)
Subjective:    Patient ID: Ralph Hurst, male    DOB: 12-Mar-1960, 53 y.o.   MRN: [id number]  HPI [name] is a 53 year old married male who comes in today for evaluation of pain and swelling of his left great toe for 4 days  He notices on Monday morning. It was red  and sore. Tuesday morning he had severe pain  He's had pain and swelling like this in the past but never this bad  Grandfather had gout  He had a few beers over the weekend  Review of Systems Negative trauma    Objective:   Physical Exam  Well-developed well-nourished male in no acute distress vital signs he was afebrile examination of left great toe shows redness and erythema      Assessment & Plan:  Acute gout,,,,,,,,,, check labs start prednisone

## 2013-05-30 NOTE — Patient Instructions (Signed)
Lab work today  Prednisone 20 mg.......... 2 tabs x3 days then taper  Tramadol 50 mg..........Marland Kitchen. 1-2 tabs 3 times daily for severe pain

## 2013-05-30 NOTE — Progress Notes (Signed)
Pre visit review using our clinic review tool, if applicable. No additional management support is needed unless otherwise documented below in the visit note. 

## 2013-05-31 ENCOUNTER — Telehealth: Payer: Self-pay | Admitting: Family Medicine

## 2013-05-31 NOTE — Telephone Encounter (Signed)
Relevant patient education mailed to patient.  

## 2013-11-25 ENCOUNTER — Other Ambulatory Visit: Payer: Self-pay | Admitting: Family Medicine

## 2013-12-27 ENCOUNTER — Other Ambulatory Visit (INDEPENDENT_AMBULATORY_CARE_PROVIDER_SITE_OTHER): Payer: 59

## 2013-12-27 DIAGNOSIS — Z0000 Encounter for general adult medical examination without abnormal findings: Secondary | ICD-10-CM

## 2013-12-27 DIAGNOSIS — E785 Hyperlipidemia, unspecified: Secondary | ICD-10-CM

## 2013-12-27 LAB — BASIC METABOLIC PANEL
BUN: 12 mg/dL (ref 6–23)
CALCIUM: 9.1 mg/dL (ref 8.4–10.5)
CO2: 32 mEq/L (ref 19–32)
CREATININE: 1 mg/dL (ref 0.4–1.5)
Chloride: 98 mEq/L (ref 96–112)
GFR: 80.86 mL/min (ref >60.00)
Glucose, Bld: 149 mg/dL — ABNORMAL HIGH (ref 70–99)
Potassium: 3.9 mEq/L (ref 3.5–5.1)
SODIUM: 137 meq/L (ref 135–145)

## 2013-12-27 LAB — LIPID PANEL
CHOL/HDL RATIO: 7
Cholesterol: 184 mg/dL (ref 0–200)
HDL: 24.7 mg/dL — ABNORMAL LOW (ref >39.00)
NonHDL: 159.3
Triglycerides: 465 mg/dL — ABNORMAL HIGH (ref 0.0–149.0)
VLDL: 93 mg/dL — AB (ref 0.0–40.0)

## 2013-12-27 LAB — HEPATIC FUNCTION PANEL
ALBUMIN: 4.1 g/dL (ref 3.5–5.2)
ALT: 77 U/L — ABNORMAL HIGH (ref 0–53)
AST: 46 U/L — AB (ref 0–37)
Alkaline Phosphatase: 57 U/L (ref 39–117)
BILIRUBIN DIRECT: 0.1 mg/dL (ref 0.0–0.3)
TOTAL PROTEIN: 6.5 g/dL (ref 6.0–8.3)
Total Bilirubin: 1 mg/dL (ref 0.2–1.2)

## 2013-12-27 LAB — POCT URINALYSIS DIPSTICK
BILIRUBIN UA: NEGATIVE
Blood, UA: NEGATIVE
Glucose, UA: NEGATIVE
Leukocytes, UA: NEGATIVE
Nitrite, UA: NEGATIVE
SPEC GRAV UA: 1.02
Urobilinogen, UA: 1
pH, UA: 6.5

## 2013-12-27 LAB — CBC WITH DIFFERENTIAL/PLATELET
BASOS ABS: 0.1 10*3/uL (ref 0.0–0.1)
BASOS PCT: 0.7 % (ref 0.0–3.0)
EOS ABS: 0.2 10*3/uL (ref 0.0–0.7)
Eosinophils Relative: 3.2 % (ref 0.0–5.0)
HCT: 51.3 % (ref 39.0–52.0)
Hemoglobin: 17.3 g/dL — ABNORMAL HIGH (ref 13.0–17.0)
LYMPHS PCT: 25.2 % (ref 12.0–46.0)
Lymphs Abs: 1.8 10*3/uL (ref 0.7–4.0)
MCHC: 33.8 g/dL (ref 30.0–36.0)
MCV: 96.2 fl (ref 78.0–100.0)
MONOS PCT: 9.8 % (ref 3.0–12.0)
Monocytes Absolute: 0.7 10*3/uL (ref 0.1–1.0)
NEUTROS PCT: 61.1 % (ref 43.0–77.0)
Neutro Abs: 4.4 10*3/uL (ref 1.4–7.7)
Platelets: 196 10*3/uL (ref 150.0–400.0)
RBC: 5.33 Mil/uL (ref 4.22–5.81)
RDW: 12.6 % (ref 11.5–15.5)
WBC: 7.1 10*3/uL (ref 4.0–10.5)

## 2013-12-27 LAB — PSA: PSA: 0.73 ng/mL (ref 0.10–4.00)

## 2013-12-27 LAB — LDL CHOLESTEROL, DIRECT: Direct LDL: 96.8 mg/dL

## 2013-12-27 LAB — TSH: TSH: 1.79 u[IU]/mL (ref 0.35–4.50)

## 2014-01-08 ENCOUNTER — Encounter: Payer: Self-pay | Admitting: Family Medicine

## 2014-01-08 ENCOUNTER — Ambulatory Visit (INDEPENDENT_AMBULATORY_CARE_PROVIDER_SITE_OTHER): Payer: 59 | Admitting: Family Medicine

## 2014-01-08 VITALS — BP 120/84 | Temp 99.3°F | Ht 70.0 in | Wt 241.3 lb

## 2014-01-08 DIAGNOSIS — R739 Hyperglycemia, unspecified: Secondary | ICD-10-CM

## 2014-01-08 DIAGNOSIS — F17200 Nicotine dependence, unspecified, uncomplicated: Secondary | ICD-10-CM

## 2014-01-08 DIAGNOSIS — R7309 Other abnormal glucose: Secondary | ICD-10-CM

## 2014-01-08 DIAGNOSIS — Z720 Tobacco use: Secondary | ICD-10-CM

## 2014-01-08 DIAGNOSIS — E785 Hyperlipidemia, unspecified: Secondary | ICD-10-CM

## 2014-01-08 DIAGNOSIS — Z0000 Encounter for general adult medical examination without abnormal findings: Secondary | ICD-10-CM

## 2014-01-08 DIAGNOSIS — I10 Essential (primary) hypertension: Secondary | ICD-10-CM

## 2014-01-08 MED ORDER — TRIAMTERENE-HCTZ 37.5-25 MG PO CAPS: 1.0000 | ORAL_CAPSULE | ORAL | Status: DC

## 2014-01-08 MED ORDER — ATENOLOL 100 MG PO TABS: ORAL_TABLET | ORAL | Status: DC

## 2014-01-08 MED ORDER — CLONIDINE HCL 0.1 MG PO TABS: 0.1000 mg | ORAL_TABLET | Freq: Every day | ORAL | Status: DC

## 2014-01-08 NOTE — Progress Notes (Signed)
Subjective:    Patient ID: Ralph Hurst, male    DOB: 1960-07-18, 53 y.o.   MRN: [id number]  HPI Quintavion is a 53 year old married male smoker,,,, down to 10 cigarettes a day,,,,,,,, had significant mood changes in side effects from the Chantix therefore stopped it,,, who comes in today for general physical examination because of history of underlying hypertension  He takes 100 mg of Tenormin and Dyazide 1 daily area BP 120/84  He gets routine eye care, dental care, never had a colonoscopy, vaccinations updated by [facility]  GI review of systems negative,,   Review of Systems  Constitutional: Negative.   HENT: Negative.   Eyes: Negative.   Respiratory: Negative.   Cardiovascular: Negative.   Gastrointestinal: Negative.   Endocrine: Negative.   Genitourinary: Negative.   Musculoskeletal: Negative.   Skin: Negative.   Allergic/Immunologic: Negative.   Neurological: Negative.   Hematological: Negative.   Psychiatric/Behavioral: Negative.        Objective:   Physical Exam  Constitutional: He is oriented to person, place, and time. He appears well-developed and well-nourished.  HENT:  Head: Normocephalic and atraumatic.  Right Ear: External ear normal.  Left Ear: External ear normal.  Nose: Nose normal.  Mouth/Throat: Oropharynx is clear and moist.  Eyes: Conjunctivae and EOM are normal. Pupils are equal, round, and reactive to light.  Neck: Normal range of motion. Neck supple. No JVD present. No tracheal deviation present. No thyromegaly present.  Cardiovascular: Normal rate, regular rhythm, normal heart sounds and intact distal pulses.  Exam reveals no gallop and no friction rub.   No murmur heard. Pulmonary/Chest: Effort normal and breath sounds normal. No stridor. No respiratory distress. He has no wheezes. He has no rales. He exhibits no tenderness.  Abdominal: Soft. Bowel sounds are normal. He exhibits no distension and no mass. There is no tenderness. There is no  rebound and no guarding.  Genitourinary: Rectum normal, prostate normal and penis normal. Guaiac negative stool. No penile tenderness.  Musculoskeletal: Normal range of motion. He exhibits no edema or tenderness.  Lymphadenopathy:    He has no cervical adenopathy.  Neurological: He is alert and oriented to person, place, and time. He has normal reflexes. No cranial nerve deficit. He exhibits normal muscle tone.  Skin: Skin is warm and dry. No rash noted. No erythema. No pallor.  Psychiatric: He has a normal mood and affect. His behavior is normal. Judgment and thought content normal.  Nursing note and vitals reviewed.         Assessment & Plan:Healthy male  Hypertension at goal,,,,,,,,,,,, continue current therapy  Tobacco abuse ........ taper ........ Chantix 0.5 mg Monday and Thursday  Elevated blood sugar ......... sugar free diet ...Marland KitchenMarland KitchenMarland Kitchen follow-up A1c and fasting blood sugar in 2 months

## 2014-01-08 NOTE — Progress Notes (Signed)
Pre visit review using our clinic review tool, if applicable. No additional management support is needed unless otherwise documented below in the visit note. 

## 2014-01-08 NOTE — Patient Instructions (Signed)
Continue current medication  Check a fasting blood sugar once weekly  Sugar free diet  Follow-up in 2 months  Nonfasting labs one week prior  Begin to taper by one cigarette per week and set a quit date in 10 weeks  Chantix 0.5 mg,,,,, Monday and Thursday at bedtime,,,,,,, if you begin to have side effects again stop the Chantix completely

## 2014-02-10 ENCOUNTER — Other Ambulatory Visit: Payer: 59

## 2014-03-13 ENCOUNTER — Other Ambulatory Visit (INDEPENDENT_AMBULATORY_CARE_PROVIDER_SITE_OTHER): Payer: 59

## 2014-03-13 DIAGNOSIS — R7309 Other abnormal glucose: Secondary | ICD-10-CM

## 2014-03-13 DIAGNOSIS — R739 Hyperglycemia, unspecified: Secondary | ICD-10-CM

## 2014-03-13 LAB — BASIC METABOLIC PANEL
BUN: 16 mg/dL (ref 6–23)
CALCIUM: 9.2 mg/dL (ref 8.4–10.5)
CHLORIDE: 104 meq/L (ref 96–112)
CO2: 29 mEq/L (ref 19–32)
Creatinine, Ser: 1 mg/dL (ref 0.40–1.50)
GFR: 82.66 mL/min (ref >60.00)
GLUCOSE: 127 mg/dL — AB (ref 70–99)
Potassium: 4 mEq/L (ref 3.5–5.1)
SODIUM: 139 meq/L (ref 135–145)

## 2014-03-13 LAB — HEMOGLOBIN A1C: Hgb A1c MFr Bld: 6.4 % (ref 4.6–6.5)

## 2014-03-20 ENCOUNTER — Encounter: Payer: Self-pay | Admitting: Family Medicine

## 2014-03-20 ENCOUNTER — Ambulatory Visit (INDEPENDENT_AMBULATORY_CARE_PROVIDER_SITE_OTHER): Payer: 59 | Admitting: Family Medicine

## 2014-03-20 VITALS — BP 110/80 | Temp 98.5°F | Wt 227.0 lb

## 2014-03-20 DIAGNOSIS — R739 Hyperglycemia, unspecified: Secondary | ICD-10-CM

## 2014-03-20 DIAGNOSIS — R7309 Other abnormal glucose: Secondary | ICD-10-CM

## 2014-03-20 DIAGNOSIS — K625 Hemorrhage of anus and rectum: Secondary | ICD-10-CM

## 2014-03-20 NOTE — Progress Notes (Signed)
Subjective:    Patient ID: Ralph Hurst, male    DOB: [birth year], 54 y.o.   MRN: [id number]  HPI Ralph Hurst is a 54 year old male who comes in today for follow-up of diabetes  He's worked very hard has lost 17 pounds has given up all his sugar. Blood sugar couple weeks ago was 149, a week ago was 127 now stented 113. His A1c is 6.4% he's done with with a combination of diet exercise and weight loss.  He's also decreased his alcohol and sugar consumption.  His physical in 2014 showed some rectal bleeding. We referred him for colonoscopy and he never went. He agrees to go for a colonoscopy although he feels asymptomatic now   Review of Systems    review of systems otherwise negative Objective:   Physical Exam  Well-developed well-nourished male no acute distress vital signs stable he is afebrile      Assessment & Plan:  Diabetes under excellent control with diet exercise and weight loss.....Marland Kitchen continue that program..... Follow-up in November 2017  History of rectal bleeding,,,,,, referred to GI for colonoscopy

## 2014-03-20 NOTE — Patient Instructions (Signed)
Continue diet exercise program  Check a fasting blood sugar weekly........... if it's normal then check it next week  If you get an abnormal sugar..... Greater than 120........ then check your blood pressure daily for 2 weeks in a row and look at all the data.  If your sugar drops below 120 then check it weekly  If all your sugars for 2 weeks or greater than 120 call and make an appointment to see me for follow-up  Return next fall for your annual physical exam  We'll set you up a consult in GI to discuss having a colonoscopy

## 2014-03-20 NOTE — Progress Notes (Signed)
Pre visit review using our clinic review tool, if applicable. No additional management support is needed unless otherwise documented below in the visit note. 

## 2014-04-24 ENCOUNTER — Encounter: Payer: Self-pay | Admitting: Family Medicine

## 2014-06-01 ENCOUNTER — Other Ambulatory Visit: Payer: Self-pay | Admitting: Family Medicine

## 2014-06-05 ENCOUNTER — Other Ambulatory Visit: Payer: Self-pay | Admitting: Family Medicine

## 2014-06-09 ENCOUNTER — Other Ambulatory Visit: Payer: Self-pay | Admitting: Family Medicine

## 2014-06-11 ENCOUNTER — Telehealth: Payer: Self-pay | Admitting: Family Medicine

## 2014-06-11 ENCOUNTER — Telehealth: Payer: Self-pay | Admitting: *Deleted

## 2014-06-11 MED ORDER — GLUCOSE BLOOD VI STRP: ORAL_STRIP | Status: DC

## 2014-06-11 MED ORDER — ONETOUCH ULTRASOFT LANCETS MISC: Status: AC

## 2014-06-11 NOTE — Telephone Encounter (Signed)
Insurance will no longer pay for Accu-chek  But will pay for One Touch Verio.

## 2014-06-11 NOTE — Telephone Encounter (Signed)
Pt needs the accu check nano test strips.  Pt test 1 x week. [facility] [address]

## 2014-06-11 NOTE — Telephone Encounter (Signed)
Rx sent 

## 2014-10-07 ENCOUNTER — Other Ambulatory Visit: Payer: Self-pay | Admitting: Family Medicine

## 2015-01-05 ENCOUNTER — Other Ambulatory Visit (INDEPENDENT_AMBULATORY_CARE_PROVIDER_SITE_OTHER): Payer: 59

## 2015-01-05 DIAGNOSIS — R739 Hyperglycemia, unspecified: Secondary | ICD-10-CM

## 2015-01-05 DIAGNOSIS — R7989 Other specified abnormal findings of blood chemistry: Secondary | ICD-10-CM | POA: Diagnosis not present

## 2015-01-05 DIAGNOSIS — R7309 Other abnormal glucose: Secondary | ICD-10-CM | POA: Diagnosis not present

## 2015-01-05 LAB — CBC WITH DIFFERENTIAL/PLATELET
BASOS PCT: 0.7 % (ref 0.0–3.0)
Basophils Absolute: 0 10*3/uL (ref 0.0–0.1)
Eosinophils Absolute: 0.3 10*3/uL (ref 0.0–0.7)
Eosinophils Relative: 3.9 % (ref 0.0–5.0)
HCT: 49.2 % (ref 39.0–52.0)
Hemoglobin: 17 g/dL (ref 13.0–17.0)
LYMPHS ABS: 1.6 10*3/uL (ref 0.7–4.0)
Lymphocytes Relative: 24.5 % (ref 12.0–46.0)
MCHC: 34.6 g/dL (ref 30.0–36.0)
MCV: 96.8 fl (ref 78.0–100.0)
MONO ABS: 0.6 10*3/uL (ref 0.1–1.0)
Monocytes Relative: 8.7 % (ref 3.0–12.0)
NEUTROS PCT: 62.2 % (ref 43.0–77.0)
Neutro Abs: 4.1 10*3/uL (ref 1.4–7.7)
Platelets: 186 10*3/uL (ref 150.0–400.0)
RBC: 5.08 Mil/uL (ref 4.22–5.81)
RDW: 12.2 % (ref 11.5–15.5)
WBC: 6.6 10*3/uL (ref 4.0–10.5)

## 2015-01-05 LAB — BASIC METABOLIC PANEL
BUN: 13 mg/dL (ref 6–23)
CHLORIDE: 102 meq/L (ref 96–112)
CO2: 30 mEq/L (ref 19–32)
Calcium: 9.3 mg/dL (ref 8.4–10.5)
Creatinine, Ser: 0.91 mg/dL (ref 0.40–1.50)
GFR: 91.89 mL/min (ref >60.00)
GLUCOSE: 143 mg/dL — AB (ref 70–99)
POTASSIUM: 4.4 meq/L (ref 3.5–5.1)
Sodium: 140 mEq/L (ref 135–145)

## 2015-01-05 LAB — LIPID PANEL
Cholesterol: 184 mg/dL (ref 0–200)
HDL: 28.1 mg/dL — ABNORMAL LOW (ref >39.00)
Total CHOL/HDL Ratio: 7
Triglycerides: 479 mg/dL — ABNORMAL HIGH (ref 0.0–149.0)

## 2015-01-05 LAB — POCT URINALYSIS DIPSTICK
Bilirubin, UA: NEGATIVE
Glucose, UA: NEGATIVE
Ketones, UA: NEGATIVE
Leukocytes, UA: NEGATIVE
Nitrite, UA: NEGATIVE
Protein, UA: NEGATIVE
SPEC GRAV UA: 1.02
Urobilinogen, UA: 1
pH, UA: 7

## 2015-01-05 LAB — LDL CHOLESTEROL, DIRECT: LDL DIRECT: 97 mg/dL

## 2015-01-05 LAB — MICROALBUMIN / CREATININE URINE RATIO
Creatinine,U: 115.9 mg/dL
Microalb Creat Ratio: 0.6 mg/g (ref 0.0–30.0)
Microalb, Ur: <0.7 mg/dL (ref 0.0–1.9)

## 2015-01-05 LAB — HEPATIC FUNCTION PANEL
ALT: 24 U/L (ref 0–53)
AST: 19 U/L (ref 0–37)
Albumin: 4.2 g/dL (ref 3.5–5.2)
Alkaline Phosphatase: 68 U/L (ref 39–117)
Bilirubin, Direct: 0.1 mg/dL (ref 0.0–0.3)
Total Bilirubin: 0.5 mg/dL (ref 0.2–1.2)
Total Protein: 6.6 g/dL (ref 6.0–8.3)

## 2015-01-05 LAB — PSA: PSA: 0.69 ng/mL (ref 0.10–4.00)

## 2015-01-05 LAB — HEMOGLOBIN A1C: Hgb A1c MFr Bld: 5.9 % (ref 4.6–6.5)

## 2015-01-05 LAB — TSH: TSH: 1.78 u[IU]/mL (ref 0.35–4.50)

## 2015-01-06 ENCOUNTER — Other Ambulatory Visit: Payer: Self-pay | Admitting: Family Medicine

## 2015-01-12 ENCOUNTER — Ambulatory Visit (INDEPENDENT_AMBULATORY_CARE_PROVIDER_SITE_OTHER): Payer: 59 | Admitting: Family Medicine

## 2015-01-12 ENCOUNTER — Encounter: Payer: Self-pay | Admitting: Family Medicine

## 2015-01-12 VITALS — BP 130/90 | Temp 99.5°F | Ht 70.0 in | Wt 224.0 lb

## 2015-01-12 DIAGNOSIS — I10 Essential (primary) hypertension: Secondary | ICD-10-CM

## 2015-01-12 DIAGNOSIS — Z23 Encounter for immunization: Secondary | ICD-10-CM

## 2015-01-12 DIAGNOSIS — Z0000 Encounter for general adult medical examination without abnormal findings: Secondary | ICD-10-CM | POA: Diagnosis not present

## 2015-01-12 DIAGNOSIS — E785 Hyperlipidemia, unspecified: Secondary | ICD-10-CM | POA: Diagnosis not present

## 2015-01-12 MED ORDER — ATENOLOL 100 MG PO TABS: 100.0000 mg | ORAL_TABLET | Freq: Every day | ORAL | Status: DC

## 2015-01-12 MED ORDER — TRIAMTERENE-HCTZ 37.5-25 MG PO TABS: 1.0000 | ORAL_TABLET | Freq: Every day | ORAL | Status: DC

## 2015-01-12 MED ORDER — CLONIDINE HCL 0.1 MG PO TABS: 0.1000 mg | ORAL_TABLET | Freq: Every day | ORAL | Status: DC

## 2015-01-12 NOTE — Progress Notes (Signed)
Subjective:    Patient ID: Ralph Hurst, male    DOB: February 24, 1960, 54 y.o.   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 Therefore 179. He had his labs drawn the Monday after Thanksgiving. He had consumed a fair amount of alcohol that weekend he says  He gets routine eye care, dental care., Have referred for colonoscopy be average gone he said it's too expensive  Tetanus booster 2007,,,,, booster given today  He declines a flu shot   Review of Systems  Constitutional: Negative.   HENT: Negative.   Eyes: Negative.   Respiratory: Negative.   Cardiovascular: Negative.   Gastrointestinal: Negative.   Endocrine: Negative.   Genitourinary: Negative.   Musculoskeletal: Negative.   Skin: Negative.   Allergic/Immunologic: Negative.   Neurological: Negative.   Hematological: Negative.   Psychiatric/Behavioral: Negative.        Objective:   Physical Exam  Constitutional: He is oriented to person, place, and time. He appears well-developed and well-nourished.  HENT:  Head: Normocephalic and atraumatic.  Right Ear: External ear normal.  Left Ear: External ear normal.  Nose: Nose normal.  Mouth/Throat: Oropharynx is clear and moist.  Eyes: Conjunctivae and EOM are normal. Pupils are equal, round, and reactive to light.  Neck: Normal range of motion. Neck supple. No JVD present. No tracheal deviation present. No thyromegaly present.  Cardiovascular: Normal rate, regular rhythm, normal heart sounds and intact distal pulses.  Exam reveals no gallop and no friction rub.   No murmur heard. No carotid neurologic bruits peripheral pulses 2+ and  symmetrical  Pulmonary/Chest: Effort normal and breath sounds normal. No stridor. No respiratory distress. He has no wheezes. He has no rales. He exhibits no tenderness.  Abdominal: Soft. Bowel sounds are normal. He exhibits no distension and no mass. There is no tenderness. There is no rebound and no guarding.  Genitourinary: Rectum normal, prostate normal and penis normal. Guaiac negative stool. No penile tenderness.  Musculoskeletal: Normal range of motion. He exhibits no edema or tenderness.  Lymphadenopathy:    He has no cervical adenopathy.  Neurological: He is alert and oriented to person, place, and time. He has normal reflexes. No cranial nerve deficit. He exhibits normal muscle tone.  Skin: Skin is warm and dry. No rash noted. No erythema. No pallor.  Psychiatric: He has a normal mood and affect. His behavior is normal. Judgment and thought content normal.  Nursing note and vitals reviewed.         Assessment & Plan:  Healthy male  Hypertension at goal.....Marland Kitchen continue current therapy  Elevated triglycerides secondary to alcohol use........... decrease alcohol use to 2 beers daily  Tobacco abuse......... again encouraged to stop smoking completely

## 2015-01-12 NOTE — Patient Instructions (Signed)
Continue current medication  1 or 2 beers daily,,,,,,,,,,, max  Follow-up in one year sooner if any problems

## 2015-01-12 NOTE — Progress Notes (Signed)
Pre visit review using our clinic review tool, if applicable. No additional management support is needed unless otherwise documented below in the visit note. 

## 2016-01-21 ENCOUNTER — Other Ambulatory Visit: Payer: Self-pay | Admitting: Family Medicine

## 2016-01-21 DIAGNOSIS — I10 Essential (primary) hypertension: Secondary | ICD-10-CM

## 2016-02-11 ENCOUNTER — Other Ambulatory Visit: Payer: Self-pay | Admitting: Family Medicine

## 2016-04-05 ENCOUNTER — Other Ambulatory Visit: Payer: Self-pay | Admitting: Family Medicine

## 2016-04-06 NOTE — Telephone Encounter (Signed)
Needs an appointment.

## 2016-04-08 ENCOUNTER — Telehealth: Payer: Self-pay | Admitting: Family Medicine

## 2016-04-08 MED ORDER — ATENOLOL 100 MG PO TABS: 100.0000 mg | ORAL_TABLET | Freq: Every day | ORAL | 0 refills | Status: DC

## 2016-04-08 NOTE — Telephone Encounter (Signed)
Pt need new Rx atenolol  Pharm:  [facility] [address]  Pt would like to come in next week for a CPE so that he is able to get his medication that he is going to be out of on Tuesday.

## 2016-04-08 NOTE — Telephone Encounter (Signed)
I sent in a 90 day supply.  Please get him on the schedule in the next 90 days.  Thanks!!

## 2016-04-11 NOTE — Telephone Encounter (Signed)
lmom for pt to call and schedule CPE appointment

## 2016-04-11 NOTE — Telephone Encounter (Signed)
Pt scheduled  

## 2016-05-16 ENCOUNTER — Other Ambulatory Visit: Payer: Self-pay | Admitting: Family Medicine

## 2016-06-10 ENCOUNTER — Other Ambulatory Visit (INDEPENDENT_AMBULATORY_CARE_PROVIDER_SITE_OTHER): Payer: BLUE CROSS/BLUE SHIELD

## 2016-06-10 DIAGNOSIS — E785 Hyperlipidemia, unspecified: Secondary | ICD-10-CM | POA: Diagnosis not present

## 2016-06-10 DIAGNOSIS — R739 Hyperglycemia, unspecified: Secondary | ICD-10-CM

## 2016-06-10 DIAGNOSIS — I10 Essential (primary) hypertension: Secondary | ICD-10-CM

## 2016-06-10 DIAGNOSIS — R7989 Other specified abnormal findings of blood chemistry: Secondary | ICD-10-CM | POA: Diagnosis not present

## 2016-06-10 LAB — HEPATIC FUNCTION PANEL
ALK PHOS: 74 U/L (ref 39–117)
ALT: 67 U/L — ABNORMAL HIGH (ref 0–53)
AST: 33 U/L (ref 0–37)
Albumin: 4.4 g/dL (ref 3.5–5.2)
BILIRUBIN DIRECT: 0.1 mg/dL (ref 0.0–0.3)
Total Bilirubin: 0.5 mg/dL (ref 0.2–1.2)
Total Protein: 6.6 g/dL (ref 6.0–8.3)

## 2016-06-10 LAB — CBC WITH DIFFERENTIAL/PLATELET
BASOS PCT: 0.9 % (ref 0.0–3.0)
Basophils Absolute: 0.1 10*3/uL (ref 0.0–0.1)
Eosinophils Absolute: 0.2 10*3/uL (ref 0.0–0.7)
Eosinophils Relative: 3.7 % (ref 0.0–5.0)
HCT: 48.6 % (ref 39.0–52.0)
HEMOGLOBIN: 17.2 g/dL — AB (ref 13.0–17.0)
LYMPHS ABS: 1.7 10*3/uL (ref 0.7–4.0)
Lymphocytes Relative: 28.3 % (ref 12.0–46.0)
MCHC: 35.3 g/dL (ref 30.0–36.0)
MCV: 94.2 fl (ref 78.0–100.0)
MONO ABS: 0.6 10*3/uL (ref 0.1–1.0)
MONOS PCT: 9.5 % (ref 3.0–12.0)
NEUTROS PCT: 57.6 % (ref 43.0–77.0)
Neutro Abs: 3.5 10*3/uL (ref 1.4–7.7)
Platelets: 180 10*3/uL (ref 150.0–400.0)
RBC: 5.16 Mil/uL (ref 4.22–5.81)
RDW: 12.7 % (ref 11.5–15.5)
WBC: 6.1 10*3/uL (ref 4.0–10.5)

## 2016-06-10 LAB — LIPID PANEL
CHOL/HDL RATIO: 7
Cholesterol: 196 mg/dL (ref 0–200)
HDL: 27.6 mg/dL — AB (ref >39.00)
Triglycerides: 739 mg/dL — ABNORMAL HIGH (ref 0.0–149.0)

## 2016-06-10 LAB — POC URINALSYSI DIPSTICK (AUTOMATED)
Bilirubin, UA: NEGATIVE
Blood, UA: NEGATIVE
Ketones, UA: NEGATIVE
LEUKOCYTES UA: NEGATIVE
NITRITE UA: NEGATIVE
PROTEIN UA: NEGATIVE
Spec Grav, UA: 1.025 (ref 1.010–1.025)
Urobilinogen, UA: 1 E.U./dL
pH, UA: 6 (ref 5.0–8.0)

## 2016-06-10 LAB — BASIC METABOLIC PANEL
BUN: 15 mg/dL (ref 6–23)
CHLORIDE: 99 meq/L (ref 96–112)
CO2: 30 mEq/L (ref 19–32)
Calcium: 9.3 mg/dL (ref 8.4–10.5)
Creatinine, Ser: 0.97 mg/dL (ref 0.40–1.50)
GFR: 84.92 mL/min (ref >60.00)
GLUCOSE: 278 mg/dL — AB (ref 70–99)
Potassium: 4 mEq/L (ref 3.5–5.1)
SODIUM: 137 meq/L (ref 135–145)

## 2016-06-10 LAB — LDL CHOLESTEROL, DIRECT: LDL DIRECT: 96 mg/dL

## 2016-06-10 LAB — PSA: PSA: 0.66 ng/mL (ref 0.10–4.00)

## 2016-06-10 LAB — TSH: TSH: 2.17 u[IU]/mL (ref 0.35–4.50)

## 2016-06-10 LAB — HEMOGLOBIN A1C: Hgb A1c MFr Bld: 7.5 % — ABNORMAL HIGH (ref 4.6–6.5)

## 2016-06-13 MED ORDER — METFORMIN HCL 500 MG PO TABS: ORAL_TABLET | ORAL | 1 refills | Status: DC

## 2016-06-15 ENCOUNTER — Encounter: Payer: Self-pay | Admitting: Family Medicine

## 2016-06-15 ENCOUNTER — Ambulatory Visit (INDEPENDENT_AMBULATORY_CARE_PROVIDER_SITE_OTHER): Payer: BLUE CROSS/BLUE SHIELD | Admitting: Family Medicine

## 2016-06-15 VITALS — BP 150/90 | Temp 98.7°F | Ht 69.75 in | Wt 229.0 lb

## 2016-06-15 DIAGNOSIS — Z23 Encounter for immunization: Secondary | ICD-10-CM

## 2016-06-15 DIAGNOSIS — R739 Hyperglycemia, unspecified: Secondary | ICD-10-CM

## 2016-06-15 DIAGNOSIS — N529 Male erectile dysfunction, unspecified: Secondary | ICD-10-CM

## 2016-06-15 DIAGNOSIS — Z136 Encounter for screening for cardiovascular disorders: Secondary | ICD-10-CM | POA: Diagnosis not present

## 2016-06-15 DIAGNOSIS — F17200 Nicotine dependence, unspecified, uncomplicated: Secondary | ICD-10-CM | POA: Diagnosis not present

## 2016-06-15 DIAGNOSIS — E785 Hyperlipidemia, unspecified: Secondary | ICD-10-CM

## 2016-06-15 DIAGNOSIS — Z0001 Encounter for general adult medical examination with abnormal findings: Secondary | ICD-10-CM | POA: Diagnosis not present

## 2016-06-15 DIAGNOSIS — I10 Essential (primary) hypertension: Secondary | ICD-10-CM

## 2016-06-15 MED ORDER — ATENOLOL 100 MG PO TABS: 100.0000 mg | ORAL_TABLET | Freq: Every day | ORAL | 4 refills | Status: DC

## 2016-06-15 MED ORDER — SILDENAFIL CITRATE 20 MG PO TABS: ORAL_TABLET | ORAL | 11 refills | Status: DC

## 2016-06-15 MED ORDER — LOSARTAN POTASSIUM-HCTZ 50-12.5 MG PO TABS: 1.0000 | ORAL_TABLET | Freq: Every day | ORAL | 4 refills | Status: DC

## 2016-06-15 NOTE — Patient Instructions (Addendum)
Carbohydrate free diet  Metformin 500 mg......... one before breakfast and a half a tab before your evening meal  Check a fasting blood sugar daily in the morning  Return in one week for follow-up........... when you return bring a record of all your blood pressure readings and your meter  Stop the Catapres and Maxide..........Marland Kitchen. begin Hyzaar 50-12 0.5......... one tablet daily in the morning  Check your blood pressure daily in the morning.......... when you return in one week bring a record of all your blood pressure readings and the device  We'll get you set up at the diabetes nutrition center  Begin to decrease smoking by 2 cigarettes per week  Generic Viagra 20 mg.......... uses directed for ED.......................................... Omron pump up digital blood pressure cuff.................Marland Kitchen. [employer]

## 2016-06-15 NOTE — Progress Notes (Signed)
Ralph Hurst is a 56 year old married male smoker.......Marland Kitchen. 10 cigarettes a day..... Who comes in today for general physical examination because of a history of hypertension, tobacco abuse, now the new onset of diabetes type 2.  Fasting labs showed a blood sugar 278 with a hemoglobin A1c of 7.5%. He's had some glucose intolerance in the past which resolved with diet and exercise.  He takes Tenormin 100 mg, Catapres 0.1 and Maxide 25 daily for hypertension BP not at goal 150/90. He is not checking his blood pressure at home.  He smokes 10 cigarettes a day. In the past week tried him on the Chantix program but it caused severe CNS side effects  We started him on metformin yesterday because of his blood sugar elevation. Dose 500 mg before breakfast and 250 mg before his evening meal.  We discussed the nutrition clinic he's willing to go.  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  14 point review of systems reviewed otherwise negative except he is no some increase in fatigue. He's also knows some decrease in sexual function.  He does not get routine eye care........Marland Kitchen. recommended annual eye exam with ophthalmologist, recent dental care because of a broken tooth  Never had a colonoscopy. We set him up for this in the past but is not gone because the times of "inconvenient". Emphasize the need for screening colonoscopy to prevent polyps turning in the colon cancer.  Family history dad died 2480 for cancer underlying hypertension diabetes. Mom 1386 diabetic one brother and one sister both in good health.  Alcohol consumption 2 beers per day  BP (!) 150/90 (BP Location: Left Arm)   Temp 98.7 F (37.1 C) (Oral)   Ht 5' 9.75" (1.772 m)   Wt 229 lb (103.9 kg)   BMI 33.09 kg/m  In general he is well-developed well-nourished male no acute distress examination HEENT were negative neck was supple thyroid is nonenlarged no carotid bruits cardiac pulmonary  exam normal except for some decreased breath sounds secondary to chronic tobacco abuse. Abdominal exam normal genitalia normal circumcised male rectum normal stool guaiac-negative prostate normal extremities normal skin normal peripheral pulses normal  #1 diabetes......... spent 30 minutes going through educational program.......Marland Kitchen. metformin 500 mg before breakfast 250 4Z male. Will be instructed on how to use a glucometer and follow-up in one week. Also set him up with the nutrition clinic.  #2 hypertension not at goal......... decrease Catapres and Maxide..........Marland Kitchen. begin Hyzaar........ BP check daily follow-up weekly  #3 tobacco abuse.......... taper by 2 per week since he cannot tolerate the Chantix  #4 erectile dysfunction.......Marland Kitchen. generic Viagra

## 2016-06-22 ENCOUNTER — Ambulatory Visit (INDEPENDENT_AMBULATORY_CARE_PROVIDER_SITE_OTHER): Payer: BLUE CROSS/BLUE SHIELD | Admitting: Family Medicine

## 2016-06-22 ENCOUNTER — Encounter: Payer: Self-pay | Admitting: Family Medicine

## 2016-06-22 VITALS — BP 162/92 | Temp 98.9°F | Wt 233.0 lb

## 2016-06-22 DIAGNOSIS — F17200 Nicotine dependence, unspecified, uncomplicated: Secondary | ICD-10-CM

## 2016-06-22 DIAGNOSIS — R739 Hyperglycemia, unspecified: Secondary | ICD-10-CM

## 2016-06-22 DIAGNOSIS — I10 Essential (primary) hypertension: Secondary | ICD-10-CM

## 2016-06-22 MED ORDER — GLUCOSE BLOOD VI STRP: ORAL_STRIP | 0 refills | Status: DC

## 2016-06-22 NOTE — Patient Instructions (Signed)
Continue your current blood pressure medications,,,,,,,,,, check your blood pressure daily in the morning,,,,,,,, if after 4 weeks your blood pressure is normal,,,,,,, 135/85 or less,,,,, then continue your medications and check your blood pressure weekly  If after 4 weeks your blood pressure is not normal,,,,,,,,, send us a message on my chart and will adjust your medication  Continue the metformin as outlined  Fasting labs the first week in September,,,,,,,,,, see me for follow-up the second week in September  Continue to taper your nicotine as outlined

## 2016-06-22 NOTE — Addendum Note (Signed)
Addended by: Raj JanusADKINS, Lukah Goswami T on: 06/22/2016 12:58 PM   Modules accepted: Orders
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
## 2016-07-27 ENCOUNTER — Telehealth: Payer: Self-pay | Admitting: Family Medicine

## 2016-07-27 NOTE — Telephone Encounter (Signed)
Error

## 2016-08-01 ENCOUNTER — Ambulatory Visit (INDEPENDENT_AMBULATORY_CARE_PROVIDER_SITE_OTHER): Payer: BLUE CROSS/BLUE SHIELD | Admitting: Family Medicine

## 2016-08-01 ENCOUNTER — Encounter: Payer: Self-pay | Admitting: Family Medicine

## 2016-08-01 VITALS — BP 180/90 | HR 58 | Temp 98.3°F | Wt 229.6 lb

## 2016-08-01 DIAGNOSIS — I10 Essential (primary) hypertension: Secondary | ICD-10-CM | POA: Diagnosis not present

## 2016-08-01 MED ORDER — AMLODIPINE BESYLATE 5 MG PO TABS: 5.0000 mg | ORAL_TABLET | Freq: Every day | ORAL | 5 refills | Status: DC

## 2016-08-01 NOTE — Progress Notes (Signed)
Subjective:     Patient ID: Ralph Hurst, male   DOB: 08/29/1960, 56 y.o.   MRN: [id number]  HPI Patient has hypertension and is here because of several elevated readings at home. He was recently last spring started on losartan HCTZ. Also takes atenolol 100 mg daily. Compliant with therapy. He does smoke. Occasional alcohol but not consistently. Brings in a log of readings and has had several readings 150s systolic and had reading this morning 179/109. No headaches. No dizziness. No chest pains. No peripheral edema.  Past Medical History:  Diagnosis Date  . Hypertension   . Polycythemia    No past surgical history on file.  reports that he has been smoking.  He has been smoking about 0.50 packs per day. He has never used smokeless tobacco. He reports that he drinks about 7.2 oz of alcohol per week . He reports that he does not use drugs. family history includes Asthma in his mother; Diabetes in his mother; Hypertension in his father and mother. No Known Allergies'     Review of Systems  Constitutional: Negative for fatigue.  Eyes: Negative for visual disturbance.  Respiratory: Negative for cough, chest tightness and shortness of breath.   Cardiovascular: Negative for chest pain, palpitations and leg swelling.  Neurological: Negative for dizziness, syncope, weakness, light-headedness and headaches.       Objective:   Physical Exam  Constitutional: He is oriented to person, place, and time. He appears well-developed and well-nourished.  HENT:  Right Ear: External ear normal.  Left Ear: External ear normal.  Mouth/Throat: Oropharynx is clear and moist.  Eyes: Pupils are equal, round, and reactive to light.  Neck: Neck supple. No thyromegaly present.  Cardiovascular: Normal rate and regular rhythm.   Pulmonary/Chest: Effort normal and breath sounds normal. No respiratory distress. He has no wheezes. He has no rales.  Musculoskeletal: He exhibits no edema.  Neurological: He is  alert and oriented to person, place, and time.       Assessment:     Hypertension-poorly controlled. Repeat readings today by me left and right arm seated at rest 180 systolic over 90 diastolic    Plan:     -Add amlodipine 5 mg daily and continue with losartan HCTZ and atenolol -He is encouraged to lose some weight -Watch sodium intake -Moderation in alcohol consumption -Encouraged to follow-up within 4 weeks to reassess  Ralph CoveyBruce W Jahanna Raether MD [facility]

## 2016-10-11 ENCOUNTER — Other Ambulatory Visit (INDEPENDENT_AMBULATORY_CARE_PROVIDER_SITE_OTHER): Payer: BLUE CROSS/BLUE SHIELD

## 2016-10-11 DIAGNOSIS — R739 Hyperglycemia, unspecified: Secondary | ICD-10-CM

## 2016-10-11 LAB — BASIC METABOLIC PANEL
BUN: 13 mg/dL (ref 6–23)
CALCIUM: 9.1 mg/dL (ref 8.4–10.5)
CO2: 31 meq/L (ref 19–32)
Chloride: 102 mEq/L (ref 96–112)
Creatinine, Ser: 0.86 mg/dL (ref 0.40–1.50)
GFR: 97.45 mL/min (ref >60.00)
GLUCOSE: 137 mg/dL — AB (ref 70–99)
Potassium: 4 mEq/L (ref 3.5–5.1)
SODIUM: 140 meq/L (ref 135–145)

## 2016-10-11 LAB — HEMOGLOBIN A1C: Hgb A1c MFr Bld: 6.1 % (ref 4.6–6.5)

## 2016-10-17 ENCOUNTER — Ambulatory Visit (INDEPENDENT_AMBULATORY_CARE_PROVIDER_SITE_OTHER): Payer: BLUE CROSS/BLUE SHIELD | Admitting: Family Medicine

## 2016-10-17 ENCOUNTER — Encounter: Payer: Self-pay | Admitting: Family Medicine

## 2016-10-17 VITALS — BP 150/108 | HR 54 | Temp 99.4°F | Wt 232.0 lb

## 2016-10-17 DIAGNOSIS — G4720 Circadian rhythm sleep disorder, unspecified type: Secondary | ICD-10-CM

## 2016-10-17 DIAGNOSIS — N183 Chronic kidney disease, stage 3 unspecified: Secondary | ICD-10-CM | POA: Insufficient documentation

## 2016-10-17 DIAGNOSIS — E1159 Type 2 diabetes mellitus with other circulatory complications: Secondary | ICD-10-CM | POA: Diagnosis not present

## 2016-10-17 DIAGNOSIS — E1122 Type 2 diabetes mellitus with diabetic chronic kidney disease: Secondary | ICD-10-CM | POA: Insufficient documentation

## 2016-10-17 DIAGNOSIS — I152 Hypertension secondary to endocrine disorders: Secondary | ICD-10-CM

## 2016-10-17 DIAGNOSIS — I10 Essential (primary) hypertension: Secondary | ICD-10-CM | POA: Diagnosis not present

## 2016-10-17 DIAGNOSIS — E119 Type 2 diabetes mellitus without complications: Secondary | ICD-10-CM | POA: Insufficient documentation

## 2016-10-17 HISTORY — DX: Circadian rhythm sleep disorder, unspecified type: G47.20

## 2016-10-17 MED ORDER — AMLODIPINE BESYLATE 5 MG PO TABS: 5.0000 mg | ORAL_TABLET | Freq: Every day | ORAL | 4 refills | Status: DC

## 2016-10-17 NOTE — Progress Notes (Signed)
Ralph Hurst is a 56 year old married male nonsmoker who comes in today for follow-up of 2 issues  Last spring his A1c was 7.5%. He's been more compliant with his diet weight has not changed blood sugars are averaging in the 120 to 1:30 range. However A1c now is down to 6.1%.  He came back in June for follow-up because after his physical examination he was monitoring his blood pressure on a daily basis cousin wasn't back to normal. He was on Norvasc 5 mg daily along with Tenormin 100 mg daily. Losartan was added to his medical regime however he's not been taking and on a daily basis.  His blood pressure varies a lot. He says in the morning sometimes is 150/120 after exercise which is work he works out size BP will oftentimes the 130/80 in the evening.  He also complains his sleep dysfunction is waking up 45 times a night. It's not having to urinate. His wife says he does snore.  Weight is 2:30 with a BMI 33.18. He does have a large neck.  BP (!) 150/108 (BP Location: Left Arm, Patient Position: Sitting, Cuff Size: Normal)   Pulse (!) 54   Temp 99.4 F (37.4 C) (Oral)   Wt 232 lb (105.2 kg)   BMI 33.53 kg/m  Imaging was well-developed well-nourished male no acute distress vital signs stable he is afebrile except for BP today 160/102  #1 diabetes at goal....... continue current therapy  #2 hypertension not at goal........... advised to take the losartan on a daily basis along with his Norvasc some beta blocker..... Also we'll get him set up for evaluation for sleep apnea.  #3 sleep dysfunction......... sleep consult as noted above

## 2016-10-17 NOTE — Patient Instructions (Signed)
Take all 3 blood pressure medications in the morning  Check your blood pressure daily in the morning........... when you go to see the pulmonologist take a copy of all your blood pressure readings with you  Since your A1c is back to normal continue current diet exercise and medications  Follow-up next spring for your annual physical examination.  ............Marland Kitchen

## 2016-12-23 ENCOUNTER — Institutional Professional Consult (permissible substitution): Payer: BLUE CROSS/BLUE SHIELD | Admitting: Internal Medicine

## 2017-04-18 ENCOUNTER — Other Ambulatory Visit: Payer: Self-pay | Admitting: Family Medicine

## 2017-07-13 ENCOUNTER — Other Ambulatory Visit: Payer: Self-pay | Admitting: Family Medicine

## 2017-09-15 ENCOUNTER — Other Ambulatory Visit: Payer: Self-pay | Admitting: Family Medicine

## 2017-10-11 ENCOUNTER — Ambulatory Visit (INDEPENDENT_AMBULATORY_CARE_PROVIDER_SITE_OTHER): Payer: BLUE CROSS/BLUE SHIELD

## 2017-10-11 ENCOUNTER — Ambulatory Visit (INDEPENDENT_AMBULATORY_CARE_PROVIDER_SITE_OTHER): Payer: BLUE CROSS/BLUE SHIELD | Admitting: Family Medicine

## 2017-10-11 ENCOUNTER — Encounter: Payer: Self-pay | Admitting: Family Medicine

## 2017-10-11 VITALS — BP 170/90 | HR 60 | Temp 98.6°F | Ht 69.5 in | Wt 234.0 lb

## 2017-10-11 DIAGNOSIS — Z125 Encounter for screening for malignant neoplasm of prostate: Secondary | ICD-10-CM

## 2017-10-11 DIAGNOSIS — F17200 Nicotine dependence, unspecified, uncomplicated: Secondary | ICD-10-CM | POA: Diagnosis not present

## 2017-10-11 DIAGNOSIS — E119 Type 2 diabetes mellitus without complications: Secondary | ICD-10-CM | POA: Diagnosis not present

## 2017-10-11 DIAGNOSIS — I10 Essential (primary) hypertension: Secondary | ICD-10-CM | POA: Diagnosis not present

## 2017-10-11 DIAGNOSIS — Z23 Encounter for immunization: Secondary | ICD-10-CM

## 2017-10-11 DIAGNOSIS — Z0000 Encounter for general adult medical examination without abnormal findings: Secondary | ICD-10-CM

## 2017-10-11 HISTORY — DX: Encounter for immunization: Z23

## 2017-10-11 HISTORY — DX: Encounter for general adult medical examination without abnormal findings: Z00.00

## 2017-10-11 LAB — CBC WITH DIFFERENTIAL/PLATELET
BASOS PCT: 1.4 % (ref 0.0–3.0)
Basophils Absolute: 0.1 10*3/uL (ref 0.0–0.1)
EOS ABS: 0.2 10*3/uL (ref 0.0–0.7)
EOS PCT: 3.4 % (ref 0.0–5.0)
HEMATOCRIT: 50 % (ref 39.0–52.0)
HEMOGLOBIN: 17.4 g/dL — AB (ref 13.0–17.0)
LYMPHS PCT: 28.5 % (ref 12.0–46.0)
Lymphs Abs: 1.7 10*3/uL (ref 0.7–4.0)
MCHC: 34.9 g/dL (ref 30.0–36.0)
MCV: 95.7 fl (ref 78.0–100.0)
MONO ABS: 0.5 10*3/uL (ref 0.1–1.0)
Monocytes Relative: 9 % (ref 3.0–12.0)
Neutro Abs: 3.5 10*3/uL (ref 1.4–7.7)
Neutrophils Relative %: 57.7 % (ref 43.0–77.0)
Platelets: 192 10*3/uL (ref 150.0–400.0)
RBC: 5.22 Mil/uL (ref 4.22–5.81)
RDW: 12.4 % (ref 11.5–15.5)
WBC: 6 10*3/uL (ref 4.0–10.5)

## 2017-10-11 LAB — LIPID PANEL
Cholesterol: 176 mg/dL (ref 0–200)
HDL: 27.5 mg/dL — AB (ref >39.00)
Total CHOL/HDL Ratio: 6

## 2017-10-11 LAB — BASIC METABOLIC PANEL
BUN: 12 mg/dL (ref 6–23)
CALCIUM: 9.4 mg/dL (ref 8.4–10.5)
CO2: 35 mEq/L — ABNORMAL HIGH (ref 19–32)
CREATININE: 0.94 mg/dL (ref 0.40–1.50)
Chloride: 100 mEq/L (ref 96–112)
GFR: 87.64 mL/min (ref >60.00)
Glucose, Bld: 149 mg/dL — ABNORMAL HIGH (ref 70–99)
Potassium: 4.3 mEq/L (ref 3.5–5.1)
SODIUM: 139 meq/L (ref 135–145)

## 2017-10-11 LAB — HEPATIC FUNCTION PANEL
ALT: 94 U/L — AB (ref 0–53)
AST: 50 U/L — ABNORMAL HIGH (ref 0–37)
Albumin: 4.3 g/dL (ref 3.5–5.2)
Alkaline Phosphatase: 50 U/L (ref 39–117)
BILIRUBIN DIRECT: 0.1 mg/dL (ref 0.0–0.3)
BILIRUBIN TOTAL: 0.8 mg/dL (ref 0.2–1.2)
Total Protein: 6.6 g/dL (ref 6.0–8.3)

## 2017-10-11 LAB — TSH: TSH: 2.44 u[IU]/mL (ref 0.35–4.50)

## 2017-10-11 LAB — LDL CHOLESTEROL, DIRECT: LDL DIRECT: 108 mg/dL

## 2017-10-11 LAB — HEMOGLOBIN A1C: HEMOGLOBIN A1C: 7 % — AB (ref 4.6–6.5)

## 2017-10-11 LAB — PSA: PSA: 0.76 ng/mL (ref 0.10–4.00)

## 2017-10-11 LAB — MICROALBUMIN / CREATININE URINE RATIO
CREATININE, U: 146.6 mg/dL
MICROALB/CREAT RATIO: 2 mg/g (ref 0.0–30.0)
Microalb, Ur: 2.9 mg/dL — ABNORMAL HIGH (ref 0.0–1.9)

## 2017-10-11 MED ORDER — AMLODIPINE BESYLATE 10 MG PO TABS: 10.0000 mg | ORAL_TABLET | Freq: Every day | ORAL | 3 refills | Status: DC

## 2017-10-11 MED ORDER — ATENOLOL 100 MG PO TABS: 100.0000 mg | ORAL_TABLET | Freq: Every day | ORAL | 4 refills | Status: DC

## 2017-10-11 MED ORDER — LOSARTAN POTASSIUM-HCTZ 50-12.5 MG PO TABS: 1.0000 | ORAL_TABLET | Freq: Every day | ORAL | 4 refills | Status: DC

## 2017-10-11 MED ORDER — METFORMIN HCL 500 MG PO TABS: ORAL_TABLET | ORAL | 4 refills | Status: DC
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
## 2017-10-11 NOTE — Patient Instructions (Addendum)
Increase the Norvasc to 10 mg daily in the morning  Continue the Hyzaar and Tenormin  Check your blood pressure daily in the morning  Return in September 30 for follow-up with a record of all your blood pressure readings and the device  Try the nicotine gum  Annual eye exam  I'll put in a request for screening colonoscopy as we discussed.  Continue the metformin...........Marland Kitchen 1 daily in the morning and a half a tablet prior to your evening male  Labs and chest x-ray today............. I will call you if there is anything abnormal if not we will review your lab work when you come back on September 30

## 2017-10-19 ENCOUNTER — Encounter: Payer: Self-pay | Admitting: Family Medicine

## 2017-12-15 ENCOUNTER — Other Ambulatory Visit: Payer: Self-pay | Admitting: Family Medicine

## 2017-12-15 DIAGNOSIS — I10 Essential (primary) hypertension: Secondary | ICD-10-CM

## 2018-01-12 ENCOUNTER — Encounter: Payer: Self-pay | Admitting: Family Medicine

## 2018-04-03 ENCOUNTER — Telehealth: Payer: Self-pay | Admitting: *Deleted

## 2018-04-03 NOTE — Telephone Encounter (Signed)
Left message on machine for patient to call and schedule a TOC appointment before refilling his Losartan 50/12.[address] [facility] [address]  CRM

## 2018-10-23 DIAGNOSIS — I10 Essential (primary) hypertension: Secondary | ICD-10-CM | POA: Diagnosis not present

## 2018-10-23 DIAGNOSIS — E119 Type 2 diabetes mellitus without complications: Secondary | ICD-10-CM | POA: Diagnosis not present

## 2018-11-06 DIAGNOSIS — D582 Other hemoglobinopathies: Secondary | ICD-10-CM | POA: Diagnosis not present

## 2018-11-06 DIAGNOSIS — R748 Abnormal levels of other serum enzymes: Secondary | ICD-10-CM | POA: Diagnosis not present

## 2018-12-12 DIAGNOSIS — R748 Abnormal levels of other serum enzymes: Secondary | ICD-10-CM | POA: Diagnosis not present

## 2019-06-24 IMAGING — DX DG CHEST 2V
2 series · 2 of 2 positions shown · non-contrast
Comparison: None.

CLINICAL DATA: Hypertension, diabetes.  Smoker

EXAM:
CHEST - 2 VIEW

[chest pa]
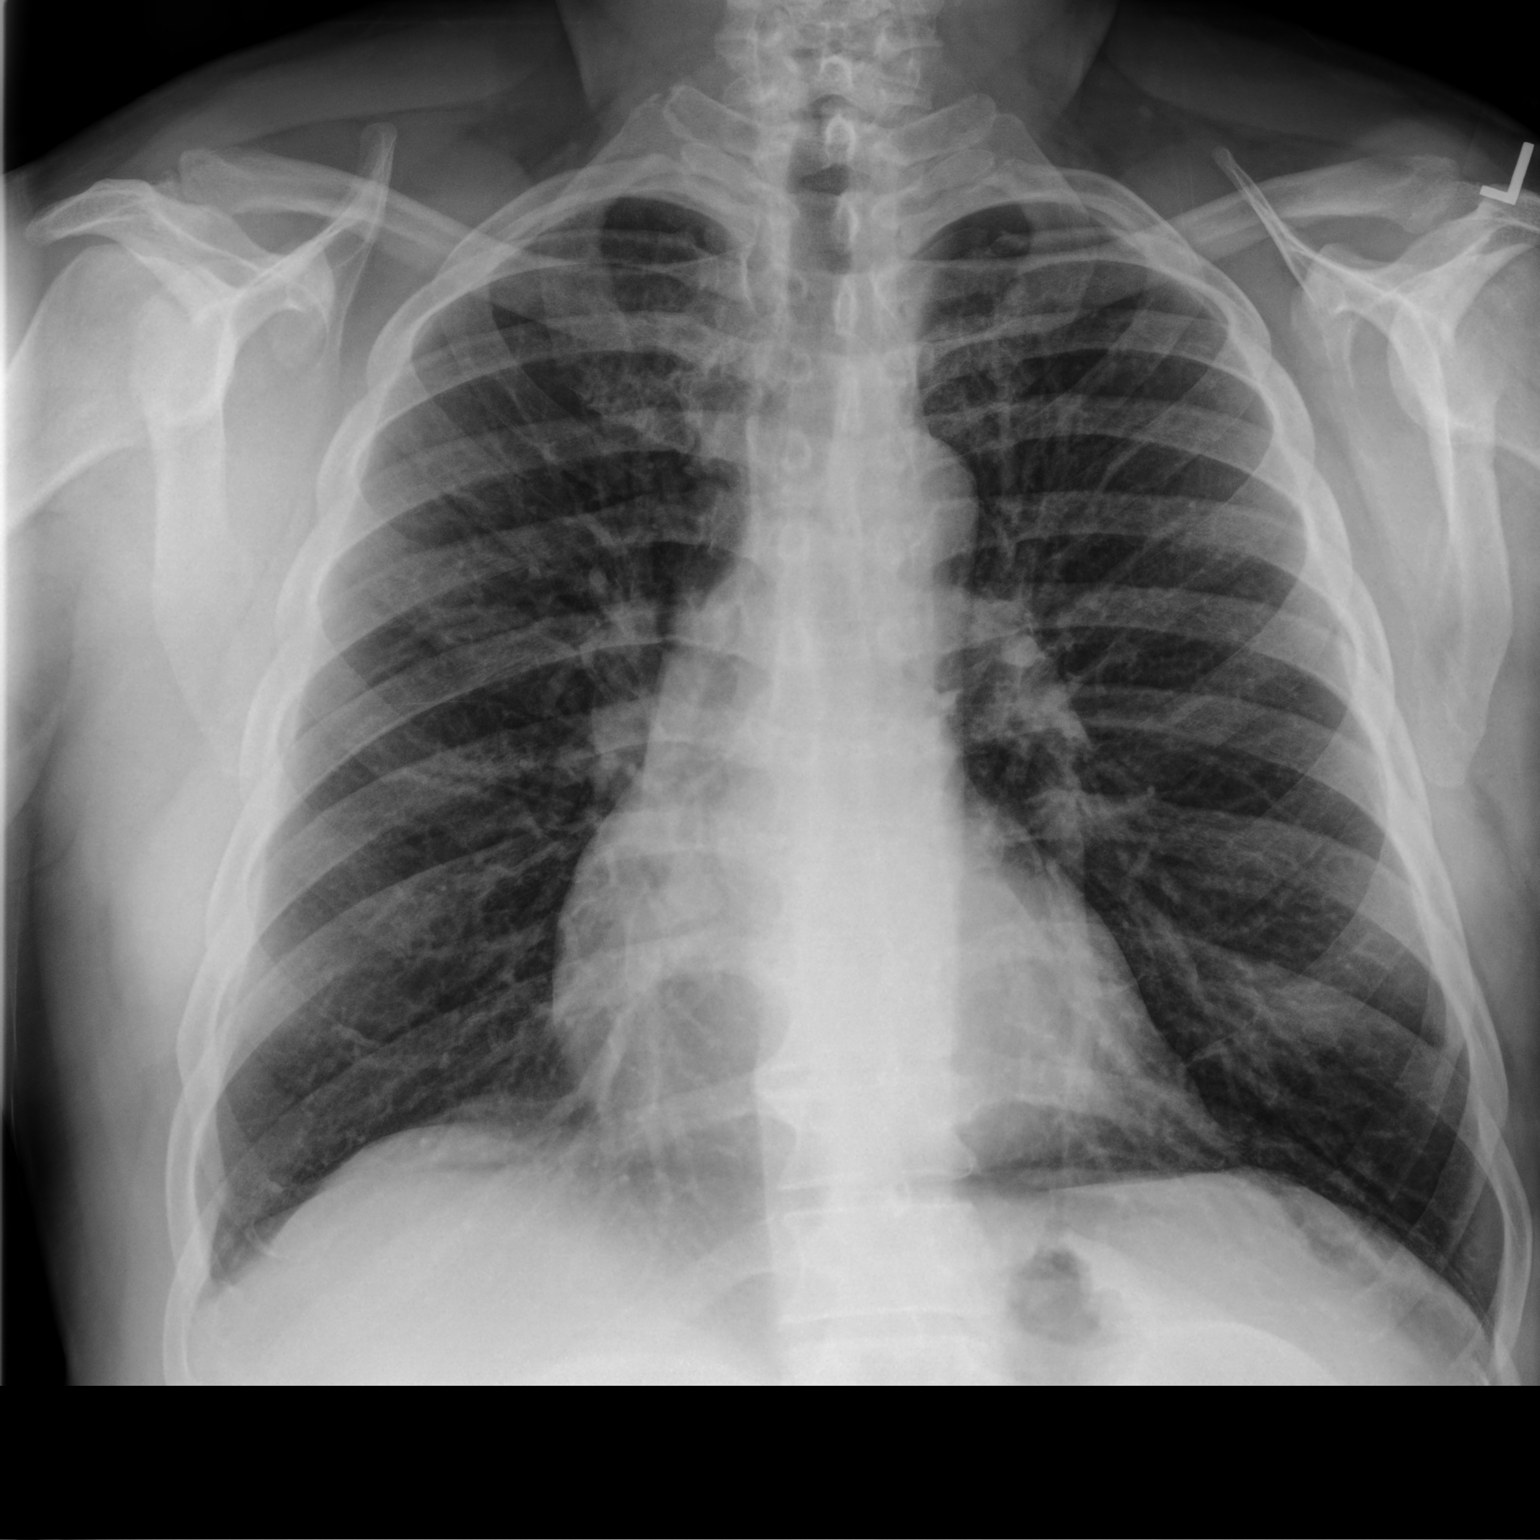

[chest lat]
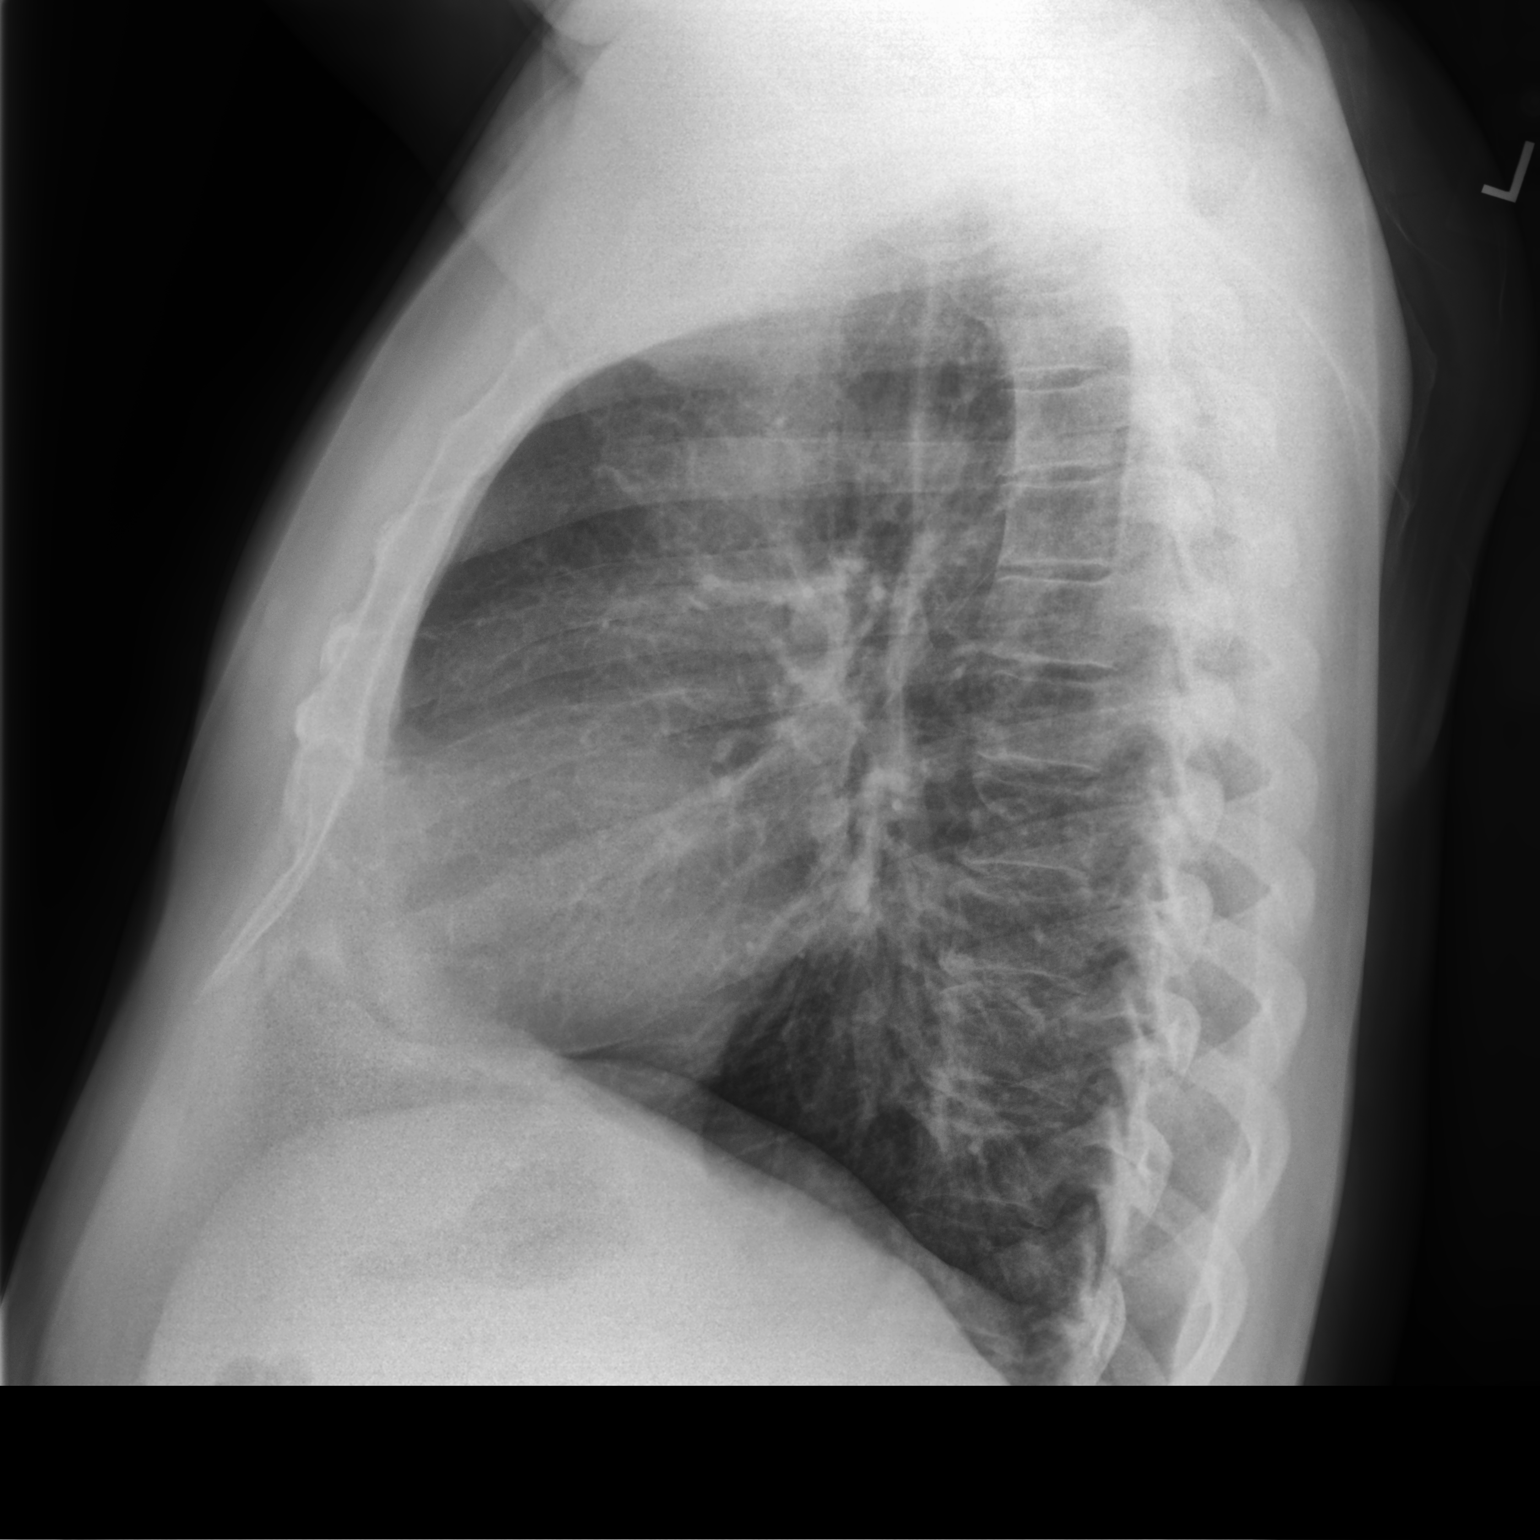

[2 of 2 positions shown; findings below may reference images not displayed]

FINDINGS: The heart size and mediastinal contours are within normal limits.
Both lungs are clear. The visualized skeletal structures are
unremarkable.
IMPRESSION: No active cardiopulmonary disease.

## 2019-08-13 DIAGNOSIS — I10 Essential (primary) hypertension: Secondary | ICD-10-CM | POA: Diagnosis not present

## 2019-08-13 DIAGNOSIS — Z720 Tobacco use: Secondary | ICD-10-CM | POA: Diagnosis not present

## 2019-08-13 DIAGNOSIS — E669 Obesity, unspecified: Secondary | ICD-10-CM | POA: Diagnosis not present

## 2019-08-13 DIAGNOSIS — E119 Type 2 diabetes mellitus without complications: Secondary | ICD-10-CM | POA: Diagnosis not present

## 2021-12-16 ENCOUNTER — Other Ambulatory Visit: Payer: Self-pay | Admitting: Family Medicine

## 2021-12-16 DIAGNOSIS — Z122 Encounter for screening for malignant neoplasm of respiratory organs: Secondary | ICD-10-CM

## 2022-02-07 DIAGNOSIS — Z419 Encounter for procedure for purposes other than remedying health state, unspecified: Secondary | ICD-10-CM | POA: Diagnosis not present

## 2022-03-10 DIAGNOSIS — Z419 Encounter for procedure for purposes other than remedying health state, unspecified: Secondary | ICD-10-CM | POA: Diagnosis not present

## 2022-04-08 DIAGNOSIS — Z419 Encounter for procedure for purposes other than remedying health state, unspecified: Secondary | ICD-10-CM | POA: Diagnosis not present

## 2022-05-09 DIAGNOSIS — Z419 Encounter for procedure for purposes other than remedying health state, unspecified: Secondary | ICD-10-CM | POA: Diagnosis not present

## 2022-05-11 ENCOUNTER — Encounter: Payer: Self-pay | Admitting: Internal Medicine

## 2022-05-11 ENCOUNTER — Ambulatory Visit: Payer: Medicaid Other | Admitting: Internal Medicine

## 2022-05-11 VITALS — BP 120/78 | HR 63 | Temp 97.7°F | Ht 69.5 in | Wt 224.0 lb

## 2022-05-11 DIAGNOSIS — E785 Hyperlipidemia, unspecified: Secondary | ICD-10-CM

## 2022-05-11 DIAGNOSIS — E669 Obesity, unspecified: Secondary | ICD-10-CM

## 2022-05-11 DIAGNOSIS — I10 Essential (primary) hypertension: Secondary | ICD-10-CM

## 2022-05-11 DIAGNOSIS — D751 Secondary polycythemia: Secondary | ICD-10-CM

## 2022-05-11 DIAGNOSIS — F17200 Nicotine dependence, unspecified, uncomplicated: Secondary | ICD-10-CM

## 2022-05-11 DIAGNOSIS — E119 Type 2 diabetes mellitus without complications: Secondary | ICD-10-CM

## 2022-05-11 DIAGNOSIS — F17210 Nicotine dependence, cigarettes, uncomplicated: Secondary | ICD-10-CM | POA: Diagnosis not present

## 2022-05-11 DIAGNOSIS — Z1211 Encounter for screening for malignant neoplasm of colon: Secondary | ICD-10-CM

## 2022-05-11 DIAGNOSIS — R0683 Snoring: Secondary | ICD-10-CM | POA: Diagnosis not present

## 2022-05-11 MED ORDER — ASPIRIN 81 MG PO TBEC
81.0000 mg | DELAYED_RELEASE_TABLET | Freq: Every day | ORAL | 3 refills | Status: AC
Start: 2022-05-11 — End: ?

## 2022-05-11 MED ORDER — ATENOLOL 100 MG PO TABS: 100.0000 mg | ORAL_TABLET | Freq: Every day | ORAL | 4 refills | Status: DC

## 2022-05-11 MED ORDER — AMLODIPINE BESYLATE 10 MG PO TABS: 10.0000 mg | ORAL_TABLET | Freq: Every day | ORAL | 3 refills | Status: DC

## 2022-05-11 MED ORDER — LOSARTAN POTASSIUM 100 MG PO TABS: 100.0000 mg | ORAL_TABLET | Freq: Every day | ORAL | 3 refills | Status: DC

## 2022-05-11 MED ORDER — SPIRONOLACTONE 50 MG PO TABS: 50.0000 mg | ORAL_TABLET | Freq: Every day | ORAL | 3 refills | Status: DC

## 2022-05-11 MED ORDER — ROSUVASTATIN CALCIUM 10 MG PO TABS: 10.0000 mg | ORAL_TABLET | Freq: Every day | ORAL | 3 refills | Status: DC

## 2022-05-11 NOTE — Assessment & Plan Note (Addendum)
Encouraged continuing with metformin, he has plenty.  In collaboration with the patient, we will explore coverage options for [name]. This may involve submitting a prior authorization request with the patient's insurance company.  I confirmed there is no patient assistance plan available at this time.  Specific rationale for Mounjaro: patient his high risk for severe complications of diabetes with current management not being optimal.   Secondary benefits of Mounjaro, besides being first line for diabetes mellitus management, for @M @@LNAME @ include suspect severe resistant hypertension and occult obstructive sleep apnea are both obesity related comorbidities that Ralph Hurst would better treat  Encouraged Ralph Hurst  to let us know if he hasn't heard anything within 1-2 weeks regarding update on authorization status.

## 2022-05-11 NOTE — Progress Notes (Signed)
[facility]: [id number]   Routine Medical Office Visit  Patient:  Ralph Hurst      Age: 62 y.o.       Sex:  male  Date:   05/11/2022  PCP:    Loralee Pacas, MD   [facility] Provider: Loralee Pacas, MD   Assessment and Plan:   Diabetes mellitus without complication Assessment & Plan: Encouraged continuing with metformin, he has plenty.  In collaboration with the patient, we will explore coverage options for [facility]. This may involve submitting a prior authorization request with the patient's insurance company.  I confirmed there is no patient assistance plan available at this time.  Specific rationale for Mounjaro: patient his high risk for severe complications of diabetes with current management not being optimal.   Secondary benefits of Mounjaro, besides being first line for diabetes mellitus management, for @M @@LNAME @ include suspect severe resistant hypertension and occult obstructive sleep apnea are both obesity related comorbidities that Darcel Bayley would better treat  Encouraged Ralph Hurst  to let us know if he hasn't heard anything within 1-2 weeks regarding update on authorization status.    Orders: -     Hemoglobin A1c; Standing -     CBC; Standing -     Comprehensive metabolic panel; Standing -     Lipid panel; Standing -     Microalbumin / creatinine urine ratio; Standing -     Aspirin; Take 1 tablet (81 mg total) by mouth daily.  Dispense: 90 tablet; Refill: 3  Colon cancer screening -     Cologuard  Polycythemia, secondary -     Aspirin; Take 1 tablet (81 mg total) by mouth daily.  Dispense: 90 tablet; Refill: 3  Hypertension, unspecified type Assessment & Plan: Well-controlled Suspicious for obstructive sleep apnea, will get sleep study Try [employer] App Continue(s) with current medication(s)   Orders: -     Ambulatory referral to Sleep Studies; Standing -     Atenolol; Take 1 tablet (100 mg total) by mouth  daily.  Dispense: 90 tablet; Refill: 4 -     Losartan Potassium; Take 1 tablet (100 mg total) by mouth daily.  Dispense: 90 tablet; Refill: 3 -     Spironolactone; Take 1 tablet (50 mg total) by mouth daily.  Dispense: 903 tablet; Refill: 3  Snoring -     Ambulatory referral to Sleep Studies; Standing  Essential hypertension -     amLODIPine Besylate; Take 1 tablet (10 mg total) by mouth daily.  Dispense: 90 tablet; Refill: 3 -     Atenolol; Take 1 tablet (100 mg total) by mouth daily.  Dispense: 90 tablet; Refill: 4  Hyperlipidemia, acquired -     Rosuvastatin Calcium; Take 1 tablet (10 mg total) by mouth daily.  Dispense: 90 tablet; Refill: 3  Obesity due to energy imbalance -     TSH; Standing  Tobacco use disorder       Clinical Presentation:   62 y.o. male here today for New Patient (Initial Visit), Hypertension, and Diabetes  Reviewed:  has a past medical history of Diabetes mellitus without complication, Essential hypertension (09/14/2006), Hyperlipidemia, Hypertension, Need for immunization against influenza (10/11/2017), Polycythemia, Routine general medical examination at a health care facility (10/11/2017), and Sleep stage dysfunction (10/17/2016). Active Ambulatory Problems    Diagnosis Date Noted   Polycythemia, secondary 09/14/2006   TOBACCO ABUSE 09/14/2006   Diabetes mellitus without complication 123456   Hypertension 10/17/2016   Resolved Ambulatory Problems  Diagnosis Date Noted   Hyperlipidemia 10/25/2007   Essential hypertension 09/14/2006   INGROWN TOENAIL, INFECTED 07/04/2008   SHOULDER PAIN, RIGHT 06/26/2007   Carpal tunnel syndrome 12/17/2012   Rectal bleeding 12/17/2012   Acute gout 05/30/2013   Elevated blood sugar 01/08/2014   Sleep stage dysfunction 10/17/2016   Routine general medical examination at a health care facility 10/11/2017   Need for immunization against influenza 10/11/2017   Past Medical History:  Diagnosis Date    Polycythemia     Outpatient Medications Prior to Visit  Medication Sig   metFORMIN (GLUCOPHAGE) 1000 MG tablet Take 1,000 mg by mouth daily with breakfast.   [DISCONTINUED] amLODipine (NORVASC) 10 MG tablet Take 1 tablet (10 mg total) by mouth daily.   [DISCONTINUED] aspirin 81 MG EC tablet Take 81 mg by mouth daily.     [DISCONTINUED] atenolol (TENORMIN) 100 MG tablet Take 1 tablet (100 mg total) by mouth daily.   [DISCONTINUED] losartan (COZAAR) 100 MG tablet Take 100 mg by mouth daily.   [DISCONTINUED] rosuvastatin (CRESTOR) 10 MG tablet Take 10 mg by mouth daily.   [DISCONTINUED] spironolactone (ALDACTONE) 50 MG tablet Take 50 mg by mouth daily.   glucose blood (IGLUCOSE TEST STRIPS) test strip Use to test blood glucose as directed. (Patient not taking: Reported on 05/11/2022)   glucose blood (ONETOUCH VERIO) test strip Use once daily.  Dx E11.9 (Patient not taking: Reported on 05/11/2022)   Lancets (ONETOUCH ULTRASOFT) lancets Use once daily. Dx E11.9 (Patient not taking: Reported on 05/11/2022)   [DISCONTINUED] losartan-hydrochlorothiazide (HYZAAR) 50-12.5 MG tablet TAKE 1 TABLET BY MOUTH EVERY DAY   [DISCONTINUED] metFORMIN (GLUCOPHAGE) 500 MG tablet TAKE 1 TABLET BY MOUTH BEFORE BREAKFAST AND 1/2 (ONE-HALF) TABLET BEFORE EVENING MEAL   No facility-administered medications prior to visit.    HPI  Updated and modified:  Problem  Diabetes Mellitus Without Complication   123456 interim history: Ralph Hurst  believes his diabetes as been very well controlled since last visit  - patient reports that they are not experiencing any blurry vision polyuria, polydipsia He reports home capillary blood glucose readings since last visit are in the range of not completed  He doesn't think he has had any significant lows or highs or side effect(s)   Ralph Hurst reports compliance with his current medication(s) which include: Metformin only He also expresses a willingness to follow this  proposed regimen:   Diabetic Medications as of 05/11/2022           metFORMIN (GLUCOPHAGE) 1000 MG tablet (Taking) Take 1,000 mg by mouth daily with breakfast.      Lab Results  Component Value Date   HGBA1C 7.0 (H) 10/11/2017   Lab Results  Component Value Date   MICROALBUR 2.9 (H) 10/11/2017   MICROALBUR <0.7 01/05/2015  No results found for: "LDLCALC" BP Readings from Last 5 Encounters:  05/11/22 120/78  10/11/17 (!) 170/90  10/17/16 (!) 150/108  08/01/16 (!) 180/90  06/22/16 (!) 162/92   Diabetes Health Maintenance Due  Topic Date Due   OPHTHALMOLOGY EXAM  Never done   FOOT EXAM  01/12/2016   HEMOGLOBIN A1C  04/11/2018  Last Foot Exam: No foot exam found Diabetic Foot Exam - Simple   No data filed   Care Team Ophthalmologist:  No care team member to display plans to see Dr. Hassell Done 05/28/22   Hypertension   Current hypertension medications:       Sig   amLODipine (NORVASC) 10 MG tablet (Taking) Take 1 tablet (10  mg total) by mouth daily.   atenolol (TENORMIN) 100 MG tablet (Taking) Take 1 tablet (100 mg total) by mouth daily.   losartan (COZAAR) 100 MG tablet (Taking) Take 100 mg by mouth daily.   spironolactone (ALDACTONE) 50 MG tablet (Taking) Take 50 mg by mouth daily.      Patient reports compliance with current medications and no significant side effect(s)  Home readings:plans to start checking BP Readings from Last 3 Encounters:  05/11/22 120/78  10/11/17 (!) 170/90  10/17/16 (!) 150/108   STOP-Bang Score Do you snore loudly?: Yes but not all night Do you often feel tired, fatigued, or sleepy during the daytime?: Yes Has anyone observed you stop breathing during sleep?: No Do you have (or are you being treated for) high blood pressure?: Yes Recent BMI (Calculated):  BMI Readings from Last 1 Encounters:  05/11/22 32.60 kg/m   Is BMI greater than 35 kg/m2?:  No Age older than 63 years old?: Yes Has large neck size > 40 cm (15.7 in, large male shirt  size, large male collar size > 16): Yes Gender Male?:  Yes STOP-Bang Total Score(total yes answers):  6      Polycythemia, Secondary   Lab Results  Component Value Date/Time   HGB 17.4 (H) 10/11/2017 10:38 AM   HGB 17.2 (H) 06/10/2016 09:11 AM   HGB 17.0 01/05/2015 08:08 AM   HGB 17.3 (H) 12/27/2013 08:38 AM   HGB 17.0 12/12/2012 08:20 AM  Takes 2 baby aspirin daily Smokes, but feels confident he doesn't have obstructive sleep apnea.    TOBACCO ABUSE  Routine General Medical Examination At [place] (Resolved)  Need for Immunization Against Influenza (Resolved)  Sleep Stage Dysfunction (Resolved)  Essential Hypertension (Resolved)   Qualifier: Diagnosis of  By: Scherrie Gerlach                Clinical Data Analysis:   Physical Exam  BP 120/78 (BP Location: Left Arm, Patient Position: Sitting)   Pulse 63   Temp 97.7 F (36.5 C) (Temporal)   Ht 5' 9.5" (1.765 m)   Wt 224 lb (101.6 kg)   SpO2 96%   BMI 32.60 kg/m  Wt Readings from Last 10 Encounters:  05/11/22 224 lb (101.6 kg)  10/11/17 234 lb (106.1 kg)  10/17/16 232 lb (105.2 kg)  08/01/16 229 lb 9.6 oz (104.1 kg)  06/22/16 233 lb (105.7 kg)  06/15/16 229 lb (103.9 kg)  01/12/15 224 lb (101.6 kg)  03/20/14 227 lb (103 kg)  01/08/14 241 lb 4.8 oz (109.5 kg)  05/30/13 242 lb (109.8 kg)   Vital signs reviewed.  Nursing notes reviewed. Weight trend reviewed. Abnormalities and Problem-Specific physical exam findings:  truncal adiposity  General Appearance:  No acute distress appreciable.   Well-groomed, healthy-appearing male.  Well proportioned with no abnormal fat distribution.  Good muscle tone. Skin: Clear and well-hydrated. Pulmonary:  Normal work of breathing at rest, no respiratory distress apparent. SpO2: 96 %  Musculoskeletal: Patient demonstrates smooth and coordinated movements throughout all major joints.All extremities are intact.  Neurological:  Awake, alert, oriented, and engaged.   No obvious focal neurological deficits or cognitive impairments.  Sensorium seems unclouded. Gait is smooth and coordinated.  Speech is clear and coherent with logical content. Psychiatric:  Appropriate mood, pleasant and cooperative demeanor, cheerful and engaged during the exam   --------------------------------    Signed: Loralee Pacas, MD 05/11/2022 1:10 PM

## 2022-05-11 NOTE — Assessment & Plan Note (Signed)
Well-controlled Suspicious for obstructive sleep apnea, will get sleep study Try [facility] App Continue(s) with current medication(s)

## 2022-05-25 ENCOUNTER — Other Ambulatory Visit (INDEPENDENT_AMBULATORY_CARE_PROVIDER_SITE_OTHER): Payer: Medicaid Other

## 2022-05-25 ENCOUNTER — Encounter: Payer: Self-pay | Admitting: Internal Medicine

## 2022-05-25 DIAGNOSIS — E119 Type 2 diabetes mellitus without complications: Secondary | ICD-10-CM | POA: Diagnosis not present

## 2022-05-25 DIAGNOSIS — R0683 Snoring: Secondary | ICD-10-CM

## 2022-05-25 DIAGNOSIS — E669 Obesity, unspecified: Secondary | ICD-10-CM | POA: Diagnosis not present

## 2022-05-25 DIAGNOSIS — I10 Essential (primary) hypertension: Secondary | ICD-10-CM

## 2022-05-25 LAB — COMPREHENSIVE METABOLIC PANEL
ALT: 26 U/L (ref 0–53)
AST: 19 U/L (ref 0–37)
Albumin: 4.2 g/dL (ref 3.5–5.2)
Alkaline Phosphatase: 48 U/L (ref 39–117)
BUN: 15 mg/dL (ref 6–23)
CO2: 27 mEq/L (ref 19–32)
Calcium: 9.1 mg/dL (ref 8.4–10.5)
Chloride: 104 mEq/L (ref 96–112)
Creatinine, Ser: 1.25 mg/dL (ref 0.40–1.50)
GFR: 61.71 mL/min (ref >60.00)
Glucose, Bld: 130 mg/dL — ABNORMAL HIGH (ref 70–99)
Potassium: 4.5 mEq/L (ref 3.5–5.1)
Sodium: 139 mEq/L (ref 135–145)
Total Bilirubin: 0.5 mg/dL (ref 0.2–1.2)
Total Protein: 6.3 g/dL (ref 6.0–8.3)

## 2022-05-25 LAB — CBC
HCT: 42.1 % (ref 39.0–52.0)
Hemoglobin: 14.9 g/dL (ref 13.0–17.0)
MCHC: 35.3 g/dL (ref 30.0–36.0)
MCV: 96.9 fl (ref 78.0–100.0)
Platelets: 206 10*3/uL (ref 150.0–400.0)
RBC: 4.35 Mil/uL (ref 4.22–5.81)
RDW: 12.9 % (ref 11.5–15.5)
WBC: 8.3 10*3/uL (ref 4.0–10.5)

## 2022-05-25 LAB — MICROALBUMIN / CREATININE URINE RATIO
Creatinine,U: 147.3 mg/dL
Microalb Creat Ratio: 0.7 mg/g (ref 0.0–30.0)
Microalb, Ur: 1 mg/dL (ref 0.0–1.9)

## 2022-05-25 LAB — LIPID PANEL
Cholesterol: 97 mg/dL (ref 0–200)
HDL: 30.8 mg/dL — ABNORMAL LOW (ref >39.00)
LDL Cholesterol: 36 mg/dL (ref 0–99)
NonHDL: 66.39
Total CHOL/HDL Ratio: 3
Triglycerides: 151 mg/dL — ABNORMAL HIGH (ref 0.0–149.0)
VLDL: 30.2 mg/dL (ref 0.0–40.0)

## 2022-05-25 LAB — TSH: TSH: 1.67 u[IU]/mL (ref 0.35–5.50)

## 2022-05-25 LAB — HEMOGLOBIN A1C: Hgb A1c MFr Bld: 6.5 % (ref 4.6–6.5)

## 2022-05-25 NOTE — Progress Notes (Signed)
Only abnormal is cholesterol.  Current recommendations are:  Lifestyle Modifications: Diet: Emphasize a heart-healthy diet rich in fruits, vegetables, whole grains, and lean proteins. Limit intake of saturated fats, trans fats, and cholesterol to improve lipid profiles  Exercise: Encourage moderate to vigorous physical activity for at least 150 minutes per week to improve overall cardiovascular health and aid in weight management. Smoking Cessation: Strongly advise no resumption of tobacco use, as smoking exacerbates hyperlipidemia and increases cardiovascular risk. Weight Management: Aim for a healthy weight to improve lipid levels and reduce hypertension. Waist circumference 36" is a good guide. Medication Adjustments: Rosuvastatin: Continue with the current dose, but consider re-evaluation for potential dose adjustment based on follow-up lipid profiles. Aspirin: Continue low-dose aspirin for cardiovascular disease prevention, unless contraindicated. Blood Pressure Management: Ensure optimal control of hypertension with the current regimen of antihypertensive medications. Adjustments may be necessary based on blood pressure monitoring at home. Diabetes Management: Continue with the current diabetes management plan, ensuring regular monitoring of blood glucose levels and A1C to maintain control(every 6 months) Monitoring and Follow-Up: Schedule regular follow-up appointments to monitor lipid profiles, blood pressure, diabetes control, and adherence to lifestyle modifications. Consider additional lipid-lowering therapy if LDL or triglyceride targets are not met with current regimen and lifestyle changes.

## 2022-06-01 ENCOUNTER — Ambulatory Visit (INDEPENDENT_AMBULATORY_CARE_PROVIDER_SITE_OTHER): Payer: Medicaid Other | Admitting: Internal Medicine

## 2022-06-01 ENCOUNTER — Encounter: Payer: Self-pay | Admitting: Internal Medicine

## 2022-06-01 VITALS — BP 128/78 | HR 61 | Temp 98.1°F | Ht 69.5 in | Wt 226.4 lb

## 2022-06-01 DIAGNOSIS — F17210 Nicotine dependence, cigarettes, uncomplicated: Secondary | ICD-10-CM

## 2022-06-01 DIAGNOSIS — Z23 Encounter for immunization: Secondary | ICD-10-CM

## 2022-06-01 DIAGNOSIS — Z0000 Encounter for general adult medical examination without abnormal findings: Secondary | ICD-10-CM

## 2022-06-01 DIAGNOSIS — Z125 Encounter for screening for malignant neoplasm of prostate: Secondary | ICD-10-CM | POA: Diagnosis not present

## 2022-06-01 LAB — PSA: PSA: 0.69 ng/mL (ref 0.10–4.00)

## 2022-06-01 NOTE — Progress Notes (Unsigned)
[facility]: [phone] Provider: Lula Olszewski, MD   Chief Complaint:  Ralph Hurst is a 62 y.o. assigned male at birth who presents today for his annual comprehensive physical exam.    Medical assistant also reports: Chief Complaint  Patient presents with   Annual Exam    Assessment/Plan:   Ralph Hurst was seen today for annual exam.  Need for shingles vaccine -     Varicella-zoster vaccine IM  Prostate cancer screening -     PSA  Smoking greater than 20 pack years -     Ambulatory Referral for Lung Cancer Scre  Preventative health care    Today's Health Maintenance Counseling and Anticipatory Guidance:   Eye exams:  every 1-2 years recommended.  Having vision corrected can improve the quality of day-to-day life.  Eye specialists can detect certain eye conditions such as cataracts, glaucoma and age-related macular degeneration, which could lead to sight loss.  They can also detect certain rare cancers and diabetes, among other things.  Ralph Hurst reports his last eye exam was:  they are sending it to me he recently had and is sending to me. Dental health: Discussed importance of regular tooth brushing, flossing, and dental visits q6 months.  Poor dentition can lead to serious medical problems - particularly problems with heart valves.  Ralph Hurst reports he has not been keeping up with his dental visits.  He intends to do so in the future.  Sinus health: Encourage sterile saline nasal misting sinus rinses daily for pollen, to reduce allergies and risk for sinus infections.   Sterile can based misting products are recommended due to superior misting and ease of maintaining sterility. Sleep Apnea screening:  we have referral pending already for this and he suspects it. Cardiovascular Risk Factor Reduction:   Advised patient of need for regular exercise and diet rich and fruits and vegetables and healthy fats to reduce risk of heart attack  and stroke.  Avoid first- and second-hand smoke and stimulants.   Avoid extreme exercise- exercise in moderation (150 minutes per week is a good goal) we really focused on the importance of fatty plans fish and grading/ramping his exercise program up slowly to be careful not to push his heart over any cliffs. Wt Readings from Last 3 Encounters:  06/01/22 226 lb 6.4 oz (102.7 kg)  05/11/22 224 lb (101.6 kg)  10/11/17 234 lb (106.1 kg)   Body mass index is 32.95 kg/m. / mild obesity , but truncal adiposity moderate. He reports his diet consists of [place] sandwiches, peanut butter and jelly, venison. He reports his exercise includes of lift heavy things all the time at work, no cardiology willing to add walking Health maintenance and immunizations reviewed and he was encouraged to complete anything that is due: Immunization History  Administered Date(s) Administered   Influenza,inj,Quad PF,6+ Mos 10/11/2017   Pneumococcal Conjugate-13 06/15/2016   Td 02/07/2005   Tdap 01/12/2015   Zoster Recombinat (Shingrix) 05/13/2021, 06/01/2022   Health Maintenance Due  Topic Date Due   OPHTHALMOLOGY EXAM  Never done   COLONOSCOPY (Pts 45-9yrs Insurance coverage will need to be confirmed)  Never done   Lung Cancer Screening  Never done    Sexual transmitted infection screening: testing offered today, but patient declined as he feels he is low risk based on his sexual history    Substance use:  I discussed that my recommendation is total abstinence from all substances of abuse including smoke and 2nd hand smoke,  alcohol, illicit drugs, smoking, inhalants, sugar.   Offered to assist with any use disorders or addictions.   Injury prevention: Discussed safety belts, safety helmets, smoke detectors. Does ride atvs, encouraged to be careful and prefer roll cages and 5 point harness, does deer and [place] hunting with Cancer Screening: Penile cancer screening: Asked about genital warts or tumors/abnormalities  of penis. Recommended Gardasil if none present, or cryo ablation if present Testicular cancer screening:  Patient was advised to palpate testicles, scrotum, penis for masses and inform me of any.   Thyroid cancer screening: patient advised to check by palpating thyroid for nodules Prostate cancer screening:  Denies family history of prostate cancer or hematospermia so too young for screening by current guidelines. Lab Results  Component Value Date   PSA 0.69 06/01/2022   PSA 0.76 10/11/2017   PSA 0.66 06/10/2016       Colon cancer screening:    He says he will send in Cologuard soon Lung cancer screening:  Current guidelines recommend Individuals aged 62 to 78 who currently smoke or formerly smoked and have a ? 20 pack-year smoking history should undergo annual screening with low-dose computed tomography (LDCT).  He reports only smoking 0.5 ppd x 40 years. Skin cancer screening:  Advised regular sunscreen use. He denies worrisome, changing, or new skin lesions. Showed him pictures of melanomas for reference:    Return to care in 1 year for next preventative visit.   Lula Olszewski, MD  Diabetic Foot Exam - Simple   Simple Foot Form Diabetic Foot exam was performed with the following findings: Yes 06/01/2022  9:11 AM  Visual Inspection No deformities, no ulcerations, no other skin breakdown bilaterally: Yes Sensation Testing Intact to touch and monofilament testing bilaterally: Yes Pulse Check Posterior Tibialis and Dorsalis pulse intact bilaterally: Yes Comments Cool to touch minimal hair       Subjective:   See problem-oriented charting in (overview sections of assessment & plan) for updated chart information added to chronic problems for future tracking  He has no acute complaints today.   ROS   I attest that I have reviewed and confirmed the patients current medications to meet the medication reconciliation requirement = Current Outpatient Medications  Medication Sig  Dispense Refill   amLODipine (NORVASC) 10 MG tablet Take 1 tablet (10 mg total) by mouth daily. 90 tablet 3   aspirin EC 81 MG tablet Take 1 tablet (81 mg total) by mouth daily. 90 tablet 3   atenolol (TENORMIN) 100 MG tablet Take 1 tablet (100 mg total) by mouth daily. 90 tablet 4   glucose blood (IGLUCOSE TEST STRIPS) test strip Use to test blood glucose as directed. 30 each 0   glucose blood (ONETOUCH VERIO) test strip Use once daily.  Dx E11.9 100 each 3   Lancets (ONETOUCH ULTRASOFT) lancets Use once daily. Dx E11.9 100 each 3   losartan (COZAAR) 100 MG tablet Take 1 tablet (100 mg total) by mouth daily. 90 tablet 3   metFORMIN (GLUCOPHAGE) 1000 MG tablet Take 1,000 mg by mouth daily with breakfast.     rosuvastatin (CRESTOR) 10 MG tablet Take 1 tablet (10 mg total) by mouth daily. 90 tablet 3   spironolactone (ALDACTONE) 50 MG tablet Take 1 tablet (50 mg total) by mouth daily. 903 tablet 3   No current facility-administered medications for this visit.    The following were reviewed and entered/updated in epic:    05/11/2022    9:26 AM  Depression  screen PHQ 2/9  Decreased Interest 0  Down, Depressed, Hopeless 0  PHQ - 2 Score 0   Past Medical History:  Diagnosis Date   Diabetes mellitus without complication    Essential hypertension 09/14/2006   Qualifier: Diagnosis of   By: Tawanna Cooler, RN, Ellen       Hyperlipidemia    Hypertension    Need for immunization against influenza 10/11/2017   Polycythemia    Routine general medical examination at a health care facility 10/11/2017   Sleep stage dysfunction 10/17/2016   Patient Active Problem List   Diagnosis Date Noted   Diabetes mellitus without complication 10/17/2016   Hypertension 10/17/2016   Hyperlipidemia 10/25/2007   Polycythemia, secondary 09/14/2006   TOBACCO ABUSE 09/14/2006   History reviewed. No pertinent surgical history. Family History  Problem Relation Age of Onset   Kidney disease Mother    Hyperlipidemia  Mother    Hearing loss Mother    COPD Mother    Hypertension Mother    Diabetes Mother    Asthma Mother    Hyperlipidemia Father    Asthma Father    Hypertension Father    Hypertension Sister    Hyperlipidemia Sister    Diabetes Sister    Hypertension Brother    Hearing loss Brother    COPD Brother    No Known Allergies Social History   Tobacco Use   Smoking status: Every Day    Packs/day: .5    Types: Cigarettes   Smokeless tobacco: Never  Vaping Use   Vaping Use: Never used  Substance Use Topics   Alcohol use: Yes    Alcohol/week: 12.0 standard drinks of alcohol    Types: 8 Cans of beer, 4 Shots of liquor per week   Drug use: No           Objective:  Physical Exam: BP 128/78 (BP Location: Right Arm, Patient Position: Sitting)   Pulse 61   Temp 98.1 F (36.7 C) (Temporal)   Ht 5' 9.5" (1.765 m)   Wt 226 lb 6.4 oz (102.7 kg)   SpO2 96%   BMI 32.95 kg/m   Body mass index is 32.95 kg/m. Wt Readings from Last 3 Encounters:  06/01/22 226 lb 6.4 oz (102.7 kg)  05/11/22 224 lb (101.6 kg)  10/11/17 234 lb (106.1 kg)   Gen: NAD, resting comfortably HEENT: TMs normal bilaterally. OP clear. No thyromegaly noted.  CV: RRR with no murmurs appreciated Pulm: NWOB, CTAB with no crackles, wheezes, or rhonchi GI: Normal bowel sounds present. Soft, Nontender, Nondistended. MSK: no edema, cyanosis, or clubbing noted Skin: warm, dry Neuro: CN2-12 grossly intact. Strength 5/5 in upper and lower extremities. Reflexes symmetric and intact bilaterally.  Psych: Normal affect and thought content

## 2022-06-01 NOTE — Patient Instructions (Signed)
It was a pleasure seeing you today!  Your health and satisfaction are my top priorities. If you believe your experience today was worthy of a 5-star rating, I'd be grateful for your feedback! Lula Olszewski, MD   Next Steps: Schedule Follow-Up:  If any of your medical issues become urgent or worsen, please don't hesitate to reach out or seek emergency room care. In the meantime, I strongly encourage scheduling routine follow up appointments prior to leaving or calling [phone] if you don't have one scheduled yet.  We recommend your next follow-up appointment no later than No follow-ups on file.  Please return sooner if you are not doing well.  Preventive Care:  Don't forget to schedule your annual preventive care visit!  This important checkup is typically covered by insurance and helps identify potential health issues early.  Typically its 100% insurance covered with no co-pay and helps to get surveillance labwork paid for.  Lab & X-ray Appointments:  Scheduled any incomplete lab tests today or call us to schedule.  XRays can be done without an appointment at [facility] ([address], [address]), M-F 8:30am-noon or 1pm-5pm.  Just tell them you're there for X-rays ordered by Dr. Jon Billings.  We'll receive the results and contact you by phone or MyChart to discuss next steps.  Medical Information Release:  If you have any relevant medical information we don't have, please sign a release form so we can obtain it for your records.  Bring to Your Next Appointment: Medications: Please bring all your medication bottles to your next appointment to ensure we have an accurate record of your prescriptions. Health Diaries: If you're monitoring any health conditions at home, keeping a diary of your readings can be very helpful for discussions at your next appointment.  Please Review your early draft clinical notes below and the final encounter summary tomorrow on MyChart after its been  completed.   Need for shingles vaccine -     Varicella-zoster vaccine IM  The main points to remember from today's wellness visit are I want you to get simply saline rinses and start using it nightly to rinse out your sinuses, I want you to call your sleep study specialist back at [facility] neurology and make sure to get a home sleep study scheduled, I want you to keep an eye on your feet and your skin and let me know if you have any breakdown or lesions of concern, I want you to send in the Cologuard testing by collecting the sample and delivering at the [employer] on the same day, I do not think you have to do anything else but we might need to follow-up again to get that eye doctor consult, the lung cancer screening there can I call you about that is very important  Getting Answers and Following Up: Simple Questions & Concerns: For quick questions or basic follow-up after your visit, reach [place] at [phone] or MyChart messaging. Complex Concerns: If your concern is more complex, scheduling an appointment might be best. Discuss this with the staff to find the most suitable option. Lab & Imaging Results: We'll contact you directly if results are abnormal or you don't use MyChart. Most normal results will be on MyChart within 2-3 business days, with a review message from Dr. Jon Billings. Haven't heard back in 2 weeks? Need results sooner? Contact us at [phone]. Referrals: Our referral coordinator will manage specialist referrals. The specialist's office should contact you within 2 weeks  to schedule an appointment. Call us if you haven't heard from them after 2 weeks.  Staying Connected:  MyChart: Activate your MyChart for the fastest way to access results and message [place]. See the last page of this paperwork for instructions.  Billing: X-ray & Lab Orders: These are billed by separate companies. Contact the invoicing company directly for questions or concerns. Visit Charges: Discuss any billing  inquiries with our administrative services team.  Feedback & Satisfaction: Share Your Experience: We strive for your satisfaction! If you have any complaints, please let Dr. Jon Billings know directly or contact our Practice Administrators, Edwena Felty or [employer], by asking at the front desk.  Scheduling Tips: Shorter Wait Times: 8 am and 1 pm appointments often have the quickest wait times. Longer Appointments: If you need more time during your visit, talk to the front desk. Due to insurance regulations, multiple back-to-back appointments might be necessary.

## 2022-06-02 NOTE — Progress Notes (Signed)
Notify normal

## 2022-06-08 DIAGNOSIS — Z419 Encounter for procedure for purposes other than remedying health state, unspecified: Secondary | ICD-10-CM | POA: Diagnosis not present

## 2022-06-29 ENCOUNTER — Telehealth: Payer: Self-pay | Admitting: Internal Medicine

## 2022-06-29 NOTE — Telephone Encounter (Signed)
Pt would like a call back with lab results 

## 2022-07-07 NOTE — Telephone Encounter (Signed)
I called pt in regards to lab results, LVM.

## 2022-07-09 DIAGNOSIS — Z419 Encounter for procedure for purposes other than remedying health state, unspecified: Secondary | ICD-10-CM | POA: Diagnosis not present

## 2022-08-08 DIAGNOSIS — Z419 Encounter for procedure for purposes other than remedying health state, unspecified: Secondary | ICD-10-CM | POA: Diagnosis not present

## 2022-09-08 DIAGNOSIS — Z419 Encounter for procedure for purposes other than remedying health state, unspecified: Secondary | ICD-10-CM | POA: Diagnosis not present

## 2022-09-19 ENCOUNTER — Telehealth: Payer: Self-pay | Admitting: Internal Medicine

## 2022-09-19 NOTE — Telephone Encounter (Signed)
Prescription Request  09/19/2022  LOV: 06/01/2022  What is the name of the medication or equipment? spironolactone (ALDACTONE) 50 MG tablet   Have you contacted your pharmacy to request a refill? Yes   Which pharmacy would you like this sent to?  [facility] [address] [place], [place] - [address] Phone: [phone]  Fax: [phone]      Patient notified that their request is being sent to the clinical staff for review and that they should receive a response within 2 business days.   Please advise at Mobile [phone] (mobile)

## 2022-09-22 ENCOUNTER — Other Ambulatory Visit: Payer: Self-pay | Admitting: *Deleted

## 2022-09-22 DIAGNOSIS — I10 Essential (primary) hypertension: Secondary | ICD-10-CM

## 2022-09-22 MED ORDER — SPIRONOLACTONE 50 MG PO TABS
50.0000 mg | ORAL_TABLET | Freq: Every day | ORAL | 3 refills | Status: DC
Start: 2022-09-22 — End: 2023-10-17

## 2022-09-22 NOTE — Telephone Encounter (Signed)
Rx send to [facility]

## 2022-09-22 NOTE — Telephone Encounter (Signed)
Patient called for an update on medication

## 2022-09-27 ENCOUNTER — Encounter: Payer: Self-pay | Admitting: *Deleted

## 2022-10-09 DIAGNOSIS — Z419 Encounter for procedure for purposes other than remedying health state, unspecified: Secondary | ICD-10-CM | POA: Diagnosis not present

## 2022-11-08 DIAGNOSIS — Z419 Encounter for procedure for purposes other than remedying health state, unspecified: Secondary | ICD-10-CM | POA: Diagnosis not present

## 2022-11-11 ENCOUNTER — Other Ambulatory Visit: Payer: Self-pay | Admitting: Internal Medicine

## 2022-11-11 DIAGNOSIS — Z1211 Encounter for screening for malignant neoplasm of colon: Secondary | ICD-10-CM

## 2022-11-11 DIAGNOSIS — Z1212 Encounter for screening for malignant neoplasm of rectum: Secondary | ICD-10-CM

## 2022-12-01 ENCOUNTER — Ambulatory Visit: Payer: Medicaid Other | Admitting: Internal Medicine

## 2022-12-01 ENCOUNTER — Encounter: Payer: Self-pay | Admitting: Internal Medicine

## 2022-12-01 VITALS — BP 112/65 | HR 60 | Temp 97.6°F | Resp 18 | Ht 69.5 in | Wt 228.4 lb

## 2022-12-01 DIAGNOSIS — E538 Deficiency of other specified B group vitamins: Secondary | ICD-10-CM | POA: Diagnosis not present

## 2022-12-01 DIAGNOSIS — R0683 Snoring: Secondary | ICD-10-CM

## 2022-12-01 DIAGNOSIS — I152 Hypertension secondary to endocrine disorders: Secondary | ICD-10-CM | POA: Diagnosis not present

## 2022-12-01 DIAGNOSIS — W57XXXA Bitten or stung by nonvenomous insect and other nonvenomous arthropods, initial encounter: Secondary | ICD-10-CM

## 2022-12-01 DIAGNOSIS — Z23 Encounter for immunization: Secondary | ICD-10-CM

## 2022-12-01 DIAGNOSIS — D539 Nutritional anemia, unspecified: Secondary | ICD-10-CM

## 2022-12-01 DIAGNOSIS — I10 Essential (primary) hypertension: Secondary | ICD-10-CM

## 2022-12-01 DIAGNOSIS — R413 Other amnesia: Secondary | ICD-10-CM | POA: Diagnosis not present

## 2022-12-01 DIAGNOSIS — E1159 Type 2 diabetes mellitus with other circulatory complications: Secondary | ICD-10-CM

## 2022-12-01 DIAGNOSIS — G4733 Obstructive sleep apnea (adult) (pediatric): Secondary | ICD-10-CM | POA: Insufficient documentation

## 2022-12-01 DIAGNOSIS — E669 Obesity, unspecified: Secondary | ICD-10-CM

## 2022-12-01 DIAGNOSIS — S30861A Insect bite (nonvenomous) of abdominal wall, initial encounter: Secondary | ICD-10-CM | POA: Diagnosis not present

## 2022-12-01 DIAGNOSIS — E119 Type 2 diabetes mellitus without complications: Secondary | ICD-10-CM

## 2022-12-01 HISTORY — DX: Nutritional anemia, unspecified: D53.9

## 2022-12-01 LAB — COMPREHENSIVE METABOLIC PANEL
ALT: 21 U/L (ref 0–53)
AST: 18 U/L (ref 0–37)
Albumin: 4.2 g/dL (ref 3.5–5.2)
Alkaline Phosphatase: 53 U/L (ref 39–117)
BUN: 18 mg/dL (ref 6–23)
CO2: 28 meq/L (ref 19–32)
Calcium: 9.4 mg/dL (ref 8.4–10.5)
Chloride: 100 meq/L (ref 96–112)
Creatinine, Ser: 1.41 mg/dL (ref 0.40–1.50)
GFR: 53.21 mL/min — ABNORMAL LOW (ref >60.00)
Glucose, Bld: 264 mg/dL — ABNORMAL HIGH (ref 70–99)
Potassium: 4.1 meq/L (ref 3.5–5.1)
Sodium: 136 meq/L (ref 135–145)
Total Bilirubin: 0.6 mg/dL (ref 0.2–1.2)
Total Protein: 6.7 g/dL (ref 6.0–8.3)

## 2022-12-01 LAB — CBC
HCT: 45.9 % (ref 39.0–52.0)
Hemoglobin: 15.3 g/dL (ref 13.0–17.0)
MCHC: 33.4 g/dL (ref 30.0–36.0)
MCV: 101.4 fL — ABNORMAL HIGH (ref 78.0–100.0)
Platelets: 197 10*3/uL (ref 150.0–400.0)
RBC: 4.53 Mil/uL (ref 4.22–5.81)
RDW: 12.5 % (ref 11.5–15.5)
WBC: 6.1 10*3/uL (ref 4.0–10.5)

## 2022-12-01 LAB — HEMOGLOBIN A1C: Hgb A1c MFr Bld: 6.8 % — ABNORMAL HIGH (ref 4.6–6.5)

## 2022-12-01 LAB — VITAMIN B12: Vitamin B-12: 210 pg/mL — ABNORMAL LOW (ref 211–911)

## 2022-12-01 LAB — MICROALBUMIN / CREATININE URINE RATIO
Creatinine,U: 111.6 mg/dL
Microalb Creat Ratio: 0.6 mg/g (ref 0.0–30.0)
Microalb, Ur: <0.7 mg/dL (ref 0.0–1.9)

## 2022-12-01 MED ORDER — DOXYCYCLINE HYCLATE 100 MG PO TABS
100.0000 mg | ORAL_TABLET | Freq: Two times a day (BID) | ORAL | 0 refills | Status: DC
Start: 2022-12-01 — End: 2023-06-19

## 2022-12-01 MED ORDER — B-12 1000 MCG SL SUBL: 1.0000 | SUBLINGUAL_TABLET | Freq: Every day | SUBLINGUAL | 3 refills | Status: DC

## 2022-12-01 MED ORDER — OZEMPIC (0.25 OR 0.5 MG/DOSE) 2 MG/3ML ~~LOC~~ SOPN
0.2500 mg | PEN_INJECTOR | SUBCUTANEOUS | 5 refills | Status: DC
Start: 2022-12-01 — End: 2023-05-19

## 2022-12-01 NOTE — Assessment & Plan Note (Signed)
His blood pressure is well controlled on current medications, including Amlodipine, Atenolol, and Losartan. We will continue the current medications.

## 2022-12-01 NOTE — Patient Instructions (Addendum)
It was a pleasure seeing you today! Your health and satisfaction are our top priorities.  Glenetta Hew, MD  VISIT SUMMARY:  During today's visit, we discussed your concerns about managing diabetes, a recent tick bite, and memory issues. We also reviewed your current medications and overall health maintenance.  YOUR PLAN:  -TYPE 2 DIABETES MELLITUS: Type 2 Diabetes Mellitus is a condition where your body does not use insulin properly, leading to high blood sugar levels. We discussed the importance of diet and meal planning, and you will start Ozempic to help control your blood sugar and assist with weight loss. We will monitor for side effects like nausea and constipation. An A1C test was done today, and you are referred to a diabetes nutritionist for further education.  -POSSIBLE LYME DISEASE: Lyme Disease is an infection caused by bacteria transmitted through tick bites. Given your long-standing tick bite and family history, we will start antibiotic treatment and monitor the bite site with photos. Please return in one month to assess your response to the treatment.  -MEMORY CONCERNS: Memory concerns can be related to various factors, including sleep quality. We discussed the importance of good quality sleep for memory restoration. A sleep study will be ordered to check for possible sleep apnea. Please return in one month to assess your memory and response to treatment.  -HYPERTENSION: Hypertension, or high blood pressure, is a condition where the force of the blood against your artery walls is too high. Your blood pressure is well controlled with your current medications, so we will continue with Amlodipine, Atenolol, and Losartan.  -GENERAL HEALTH MAINTENANCE: For your general health, you received the influenza vaccine today. We will also obtain records from your recent eye exam for diabetes management and schedule a follow-up visit in one month.  INSTRUCTIONS:  Please follow up in one month  to assess your response to the treatments for Lyme disease and memory concerns. Additionally, ensure you attend the sleep study and your appointment with the diabetes nutritionist.  NEXT STEPS: [x]  Early Intervention: Schedule sooner appointment, call our on-call services, or go to emergency room if there is any significant Increase in pain or discomfort New or worsening symptoms Sudden or severe changes in your health [x]  Flexible Follow-Up: We recommend a Return in 6 months (on 06/01/2023). for optimal routine care. This allows for progress monitoring and treatment adjustments. [x]  Preventive Care: Schedule your annual preventive care visit! It's typically covered by insurance and helps identify potential health issues early. [x]  Lab & X-ray Appointments: Incomplete tests scheduled today, or call to schedule. X-rays: [facility] (M-F, 8:30am-noon or 1pm-5pm). [x]  Medical Information Release: Sign a release form at front desk to obtain relevant medical information we don't have.  MAKING THE MOST OF OUR FOCUSED 20 MINUTE APPOINTMENTS: [x]   Clearly state your top concerns at the beginning of the visit to focus our discussion [x]   If you anticipate you will need more time, please inform the front desk during scheduling - we can book multiple appointments in the same week. [x]   If you have transportation problems- use our convenient video appointments or ask about transportation support. [x]   We can get down to business faster if you use MyChart to update information before the visit and submit non-urgent questions before your visit. Thank you for taking the time to provide details through MyChart.  Let our nurse know and she can import this information into your encounter documents.  Arrival and Wait Times: [x]   Arriving  on time ensures that everyone receives prompt attention. [x]   Early morning (8a) and afternoon (1p) appointments tend to have shortest wait times. [x]    Unfortunately, we cannot delay appointments for late arrivals or hold slots during phone calls.  Getting Answers and Following Up [x]   Simple Questions & Concerns: For quick questions or basic follow-up after your visit, reach [place] at [phone] or MyChart messaging. [x]   Complex Concerns: If your concern is more complex, scheduling an appointment might be best. Discuss this with the staff to find the most suitable option. [x]   Lab & Imaging Results: We'll contact you directly if results are abnormal or you don't use MyChart. Most normal results will be on MyChart within 2-3 business days, with a review message from Dr. Jon Billings. Haven't heard back in 2 weeks? Need results sooner? Contact us at [phone]. [x]   Referrals: Our referral coordinator will manage specialist referrals. The specialist's office should contact you within 2 weeks to schedule an appointment. Call us if you haven't heard from them after 2 weeks.  Staying Connected [x]   MyChart: Activate your MyChart for the fastest way to access results and message [place]. See the last page of this paperwork for instructions on how to activate.  Bring to Your Next Appointment [x]   Medications: Please bring all your medication bottles to your next appointment to ensure we have an accurate record of your prescriptions. [x]   Health Diaries: If you're monitoring any health conditions at home, keeping a diary of your readings can be very helpful for discussions at your next appointment.  Billing [x]   X-ray & Lab Orders: These are billed by separate companies. Contact the invoicing company directly for questions or concerns. [x]   Visit Charges: Discuss any billing inquiries with our administrative services team.  Your Satisfaction Matters [x]   Share Your Experience: We strive for your satisfaction! If you have any complaints, or preferably compliments, please let Dr. Jon Billings know directly or contact our Practice Administrators, Edwena Felty  or [employer], by asking at the front desk.   Reviewing Your Records [x]   Review this early draft of your clinical encounter notes below and the final encounter summary tomorrow on MyChart after its been completed.  All orders placed so far are visible here: Diabetes mellitus without complication (HCC) -     Ozempic (0.25 or 0.5 MG/DOSE); Inject 0.25 mg into the skin once a week.  Dispense: 3 mL; Refill: 5 -     Microalbumin / creatinine urine ratio -     Comprehensive metabolic panel -     Hemoglobin A1c -     CBC  Obesity due to energy imbalance  Need for influenza vaccination -     Flu vaccine trivalent PF, 6mos and older(Flulaval,Afluria,Fluarix,Fluzone)  Hypertension  Hypertension associated with diabetes (HCC)  Memory loss -     Vitamin B12  Tick bite of abdomen, initial encounter -     Doxycycline Hyclate; Take 1 tablet (100 mg total) by mouth 2 (two) times daily.  Dispense: 20 tablet; Refill: 0  Snoring -     Ambulatory referral to Sleep Studies

## 2022-12-01 NOTE — Assessment & Plan Note (Signed)
He has gained weight and decreased his activity level. We discussed the importance of diet and meal planning. His current medications include Amlodipine, Atenolol, Losartan, Metformin, Rosuvastatin, and Spironolactone. We will start Ozempic for better sugar control and weight loss, monitoring for potential side effects like nausea and constipation. An A1C check is scheduled for today, and he is referred to a diabetes nutritionist for further education and meal planning.

## 2022-12-01 NOTE — Progress Notes (Signed)
[facility]: [phone]   -- Medical Office Visit --  Patient:  Ralph Hurst      Age: 62 y.o.       Sex:  male  Date:   12/01/2022 Patient Care Team: Lula Olszewski, MD as PCP - General (Internal Medicine) Today's Healthcare Provider: Lula Olszewski, MD   Assessment   Plan      Assessment & Plan Diabetes mellitus without complication [facility]) He has gained weight and decreased his activity level. We discussed the importance of diet and meal planning. His current medications include Amlodipine, Atenolol, Losartan, Metformin, Rosuvastatin, and Spironolactone. We will start Ozempic for better sugar control and weight loss, monitoring for potential side effects like nausea and constipation. An A1C check is scheduled for today, and he is referred to a diabetes nutritionist for further education and meal planning. Obesity due to energy imbalance  Need for influenza vaccination  Hypertension His blood pressure is well controlled on current medications, including Amlodipine, Atenolol, and Losartan. We will continue the current medications. Hypertension associated with diabetes (HCC) His blood pressure is well controlled on current medications, including Amlodipine, Atenolol, and Losartan. We will continue the current medications. Memory loss He has been experiencing memory issues and was advised on the importance of good quality sleep for memory restoration. A sleep study will be ordered to assess for possible sleep apnea, and he will return in one month to assess memory concerns and response to treatment. Tick bite of abdomen, initial encounter He presents with a long-standing tick bite and associated memory concerns, with a family history of chronic Lyme disease. We will start antibiotic treatment and track the progress of the tick bite site with photos. He will return in one month to assess the response to treatment. Snoring    General Health  Maintenance   The influenza vaccine will be administered today. We will obtain records from a recent eye exam for diabetes management and schedule a follow-up visit in one month.       ED Discharge Orders          Ordered    Flu vaccine trivalent PF, 6mos and older(Flulaval,Afluria,Fluarix,Fluzone)        12/01/22 0824    Semaglutide,0.25 or 0.5MG /DOS, (OZEMPIC, 0.25 OR 0.5 MG/DOSE,) 2 MG/3ML SOPN  Weekly        12/01/22 0842    Microalbumin / creatinine urine ratio       Comments:     12/01/22 0842    doxycycline (VIBRA-TABS) 100 MG tablet  2 times daily        12/01/22 0842    Ambulatory referral to Sleep Studies        12/01/22 0842    Comprehensive metabolic panel        12/01/22 0842    Hemoglobin A1c        12/01/22 0842    CBC        12/01/22 0842    B12        12/01/22 0844    MR Brain W Wo Contrast        12/01/22 0848          Diagnoses and all orders for this visit: Diabetes mellitus without complication (HCC) -     Semaglutide,0.25 or 0.5MG /DOS, (OZEMPIC, 0.25 OR 0.5 MG/DOSE,) 2 MG/3ML SOPN; Inject 0.25 mg into the skin once a week. -     Microalbumin / creatinine urine ratio -  Comprehensive metabolic panel -     Hemoglobin A1c -     CBC Obesity due to energy imbalance Need for influenza vaccination -     Flu vaccine trivalent PF, 6mos and older(Flulaval,Afluria,Fluarix,Fluzone) Hypertension Hypertension associated with diabetes (HCC) Memory loss -     B12 -     MR Brain W Wo Contrast; Future Tick bite of abdomen, initial encounter -     doxycycline (VIBRA-TABS) 100 MG tablet; Take 1 tablet (100 mg total) by mouth 2 (two) times daily. Snoring -     Ambulatory referral to Sleep Studies  Recommended follow-up: Return in 1 month (on 01/01/2023), or fu memory loss , newly started ozempic.No future appointments.      Subjective   62 y.o. male who has Hyperlipidemia; Polycythemia, secondary; TOBACCO ABUSE; Diabetes mellitus without  complication (HCC); Hypertension associated with diabetes (HCC); Snoring; and Memory loss on their problem list.. Their main reasons/main concerns/chief complaints for today's office visit are Medical Management of Chronic Issues (6 month follow-up/Not fasting)   ------------------------------------------------------------------------------------------------------------------------ AI-Extracted: Discussed the use of AI scribe software for clinical note transcription with the patient, who gave verbal consent to proceed.  History of Present Illness   The patient, with a history of diabetes, hypertension, and a recent tick bite, presents with concerns about dietary management and memory issues. He expresses difficulty in maintaining a low-carb diet due to financial constraints and the perishability of fresh produce. He expresses interest in dietary education and consultation with a nutritionist to better manage his diabetes.  The patient also reports a tick bite that has persisted for over six months, which has raised concerns about potential Lyme disease. He denies any associated symptoms but expresses concern due to a family history of chronic Lyme disease.  Additionally, the patient's partner has noticed memory issues, including forgetting the names of common tools and increased irritability. These changes have raised concerns about potential cognitive decline. The patient's partner also reports that the patient has been sleeping a lot, but the quality of sleep is unknown.  The patient is currently on a regimen of amlodipine, aspirin, atenolol, losartan, metformin, rosuvastatin, and spironolactone for his chronic conditions. He reports consistent adherence to his medication regimen. He has also noticed a slight weight gain, which he attributes to decreased activity levels.       Note that patient  has a past medical history of Diabetes mellitus without complication (HCC), Essential hypertension  (09/14/2006), Hyperlipidemia, Hypertension, Need for immunization against influenza (10/11/2017), Polycythemia, Routine general medical examination at a health care facility (10/11/2017), and Sleep stage dysfunction (10/17/2016).  Problem list overviews that were updated at today's visit: Problem  Snoring  Memory Loss  Diabetes Mellitus Without Complication (Hcc)   05/11/2022 interim history: Mr.Desmith  believes his diabetes as been very well controlled since last visit  - patient reports that they are not experiencing any blurry vision polyuria, polydipsia He reports home capillary blood glucose readings since last visit are in the range of not completed  He doesn't think he has had any significant lows or highs or side effect(s)   Mr.Eckels reports compliance with his current medication(s) which include: Metformin only He also expresses a willingness to follow this proposed regimen:   Diabetic Medications as of 05/11/2022           metFORMIN (GLUCOPHAGE) 1000 MG tablet (Taking) Take 1,000 mg by mouth daily with breakfast.      Lab Results  Component Value Date   HGBA1C 7.0 (H)  10/11/2017   Lab Results  Component Value Date   MICROALBUR 2.9 (H) 10/11/2017   MICROALBUR <0.7 01/05/2015   No results found for: "LDLCALC" BP Readings from Last 5 Encounters:  05/11/22 120/78  10/11/17 (!) 170/90  10/17/16 (!) 150/108  08/01/16 (!) 180/90  06/22/16 (!) 162/92   Diabetes Health Maintenance Due  Topic Date Due   OPHTHALMOLOGY EXAM  Never done   FOOT EXAM  01/12/2016   HEMOGLOBIN A1C  04/11/2018   Last Foot Exam: No foot exam found Diabetic Foot Exam - Simple   No data filed    Care Team Ophthalmologist:  No care team member to display plans to see Dr. Caryn Section 05/28/22   Hypertension Associated With Diabetes (Hcc)   Current hypertension medications:       Sig   amLODipine (NORVASC) 10 MG tablet (Taking) Take 1 tablet (10 mg total) by mouth daily.   atenolol  (TENORMIN) 100 MG tablet (Taking) Take 1 tablet (100 mg total) by mouth daily.   losartan (COZAAR) 100 MG tablet (Taking) Take 100 mg by mouth daily.   spironolactone (ALDACTONE) 50 MG tablet (Taking) Take 50 mg by mouth daily.      Patient reports compliance with current medications and no significant side effect(s)  Home readings:plans to start checking BP Readings from Last 3 Encounters:  05/11/22 120/78  10/11/17 (!) 170/90  10/17/16 (!) 150/108   STOP-Bang Score Do you snore loudly?: Yes but not all night Do you often feel tired, fatigued, or sleepy during the daytime?: Yes Has anyone observed you stop breathing during sleep?: No Do you have (or are you being treated for) high blood pressure?: Yes Recent BMI (Calculated):  BMI Readings from Last 1 Encounters:  05/11/22 32.60 kg/m   Is BMI greater than 35 kg/m2?:  No Age older than 63 years old?: Yes Has large neck size > 40 cm (15.7 in, large male shirt size, large male collar size > 16): Yes Gender Male?:  Yes STOP-Bang Total Score(total yes answers):  6       Current Outpatient Medications on File Prior to Visit  Medication Sig   amLODipine (NORVASC) 10 MG tablet Take 1 tablet (10 mg total) by mouth daily.   aspirin EC 81 MG tablet Take 1 tablet (81 mg total) by mouth daily.   atenolol (TENORMIN) 100 MG tablet Take 1 tablet (100 mg total) by mouth daily.   glucose blood (IGLUCOSE TEST STRIPS) test strip Use to test blood glucose as directed.   glucose blood (ONETOUCH VERIO) test strip Use once daily.  Dx E11.9   Lancets (ONETOUCH ULTRASOFT) lancets Use once daily. Dx E11.9   losartan (COZAAR) 100 MG tablet Take 1 tablet (100 mg total) by mouth daily.   metFORMIN (GLUCOPHAGE) 1000 MG tablet Take 1,000 mg by mouth daily with breakfast.   rosuvastatin (CRESTOR) 10 MG tablet Take 1 tablet (10 mg total) by mouth daily.   spironolactone (ALDACTONE) 50 MG tablet Take 1 tablet (50 mg total) by mouth daily.   No current  facility-administered medications on file prior to visit.  There are no discontinued medications.   Objective   Physical Exam  BP 112/65   Pulse 60   Temp 97.6 F (36.4 C) (Temporal)   Resp 18   Ht 5' 9.5" (1.765 m)   Wt 228 lb 6 oz (103.6 kg)   SpO2 94%   BMI 33.24 kg/m  Wt Readings from Last 10 Encounters:  12/01/22 228 lb 6 oz (  103.6 kg)  06/01/22 226 lb 6.4 oz (102.7 kg)  05/11/22 224 lb (101.6 kg)  10/11/17 234 lb (106.1 kg)  10/17/16 232 lb (105.2 kg)  08/01/16 229 lb 9.6 oz (104.1 kg)  06/22/16 233 lb (105.7 kg)  06/15/16 229 lb (103.9 kg)  01/12/15 224 lb (101.6 kg)  03/20/14 227 lb (103 kg)   Vital signs reviewed.  Nursing notes reviewed. Weight trend reviewed. Abnormalities and Problem-Specific physical exam findings:  memory seems a little worsened but not bad. Mild truncal adiposity  General Appearance:  No acute distress appreciable.   Well-groomed, healthy-appearing male.  Well proportioned with no abnormal fat distribution.  Good muscle tone. Pulmonary:  Normal work of breathing at rest, no respiratory distress apparent. SpO2: 94 %  Musculoskeletal: All extremities are intact.  Neurological:  Awake, alert, oriented, and engaged.  No obvious focal neurological deficits or cognitive impairments.  Sensorium seems unclouded.   Speech is clear and coherent with logical content. Psychiatric:  Appropriate mood, pleasant and cooperative demeanor,  Results            No results found for any visits on 12/01/22.  Office Visit on 06/01/2022  Component Date Value   PSA 06/01/2022 0.69   Lab on 05/25/2022  Component Date Value   Microalb, Ur 05/25/2022 1.0    Creatinine,U 05/25/2022 147.3    Microalb Creat Ratio 05/25/2022 0.7    TSH 05/25/2022 1.67    Hgb A1c MFr Bld 05/25/2022 6.5    WBC 05/25/2022 8.3    RBC 05/25/2022 4.35    Platelets 05/25/2022 206.0    Hemoglobin 05/25/2022 14.9    HCT 05/25/2022 42.1    MCV 05/25/2022 96.9    MCHC 05/25/2022 35.3     RDW 05/25/2022 12.9    Sodium 05/25/2022 139    Potassium 05/25/2022 4.5    Chloride 05/25/2022 104    CO2 05/25/2022 27    Glucose, Bld 05/25/2022 130 (H)    BUN 05/25/2022 15    Creatinine, Ser 05/25/2022 1.25    Total Bilirubin 05/25/2022 0.5    Alkaline Phosphatase 05/25/2022 48    AST 05/25/2022 19    ALT 05/25/2022 26    Total Protein 05/25/2022 6.3    Albumin 05/25/2022 4.2    GFR 05/25/2022 61.71    Calcium 05/25/2022 9.1    Cholesterol 05/25/2022 97    Triglycerides 05/25/2022 151.0 (H)    HDL 05/25/2022 30.80 (L)    VLDL 05/25/2022 30.2    LDL Cholesterol 05/25/2022 36    Total CHOL/HDL Ratio 05/25/2022 3    NonHDL 05/25/2022 66.39    No image results found.   No results found.  No results found.     Additional Info: This encounter employed real-time, collaborative documentation. The patient actively reviewed and updated their medical record on a shared screen, ensuring transparency and facilitating joint problem-solving for the problem list, overview, and plan. This approach promotes accurate, informed care. The treatment plan was discussed and reviewed in detail, including medication safety, potential side effects, and all patient questions. We confirmed understanding and comfort with the plan. Follow-up instructions were established, including contacting the office for any concerns, returning if symptoms worsen, persist, or new symptoms develop, and precautions for potential emergency department visits.

## 2022-12-01 NOTE — Assessment & Plan Note (Signed)
He has been experiencing memory issues and was advised on the importance of good quality sleep for memory restoration. A sleep study will be ordered to assess for possible sleep apnea, and he will return in one month to assess memory concerns and response to treatment.

## 2022-12-01 NOTE — Addendum Note (Signed)
Addended by: Lula Olszewski on: 12/01/2022 09:29 PM   Modules accepted: Orders

## 2022-12-07 ENCOUNTER — Telehealth: Payer: Self-pay | Admitting: Internal Medicine

## 2022-12-07 ENCOUNTER — Other Ambulatory Visit (HOSPITAL_COMMUNITY): Payer: Self-pay

## 2022-12-07 ENCOUNTER — Telehealth: Payer: Self-pay | Admitting: Pharmacist

## 2022-12-07 NOTE — Telephone Encounter (Signed)
Reason for Referral Request:  Diabetes Management  Has Patient been seen by PCP for this complaint? Yes  No, Please schedule patient for appointment for complaint.  Yes, Please find out following information:  Reason: Diabetes Education & Nutrition  Referral to which Specialty: Nutrition  Preferred office/ provider:  No preference

## 2022-12-07 NOTE — Telephone Encounter (Signed)
Pharmacy Patient Advocate Encounter   Received notification from Patient Pharmacy that prior authorization for Ozempic (0.25 or 0.5 MG/DOSE) 2MG /3ML pen-injectors is required/requested.   Insurance verification completed.   The patient is insured through [facility] [place] .   Per test claim: PA required; PA submitted to above mentioned insurance via CoverMyMeds Key/confirmation #/EOC [id number] Status is pending

## 2022-12-07 NOTE — Telephone Encounter (Signed)
Pharmacy Patient Advocate Encounter  Received notification from [facility] Medicaid that Prior Authorization for Ozempic (0.25 or 0.5 MG/DOSE) 2MG /3ML pen-injectors has been APPROVED from 12/07/2022 to 12/07/2023. Ran test claim, Copay is $4.00. This test claim was processed through [facility]- copay amounts may vary at other pharmacies due to pharmacy/plan contracts, or as the patient moves through the different stages of their insurance plan.   PA #/Case ID/Reference #: [id number]  Pharmacy notified.

## 2022-12-08 ENCOUNTER — Other Ambulatory Visit: Payer: Self-pay

## 2022-12-08 ENCOUNTER — Other Ambulatory Visit (HOSPITAL_COMMUNITY): Payer: Self-pay

## 2022-12-08 DIAGNOSIS — E1122 Type 2 diabetes mellitus with diabetic chronic kidney disease: Secondary | ICD-10-CM

## 2022-12-08 NOTE — Telephone Encounter (Signed)
Placed referral for patient  

## 2022-12-09 DIAGNOSIS — Z419 Encounter for procedure for purposes other than remedying health state, unspecified: Secondary | ICD-10-CM | POA: Diagnosis not present

## 2022-12-12 ENCOUNTER — Encounter: Payer: Medicaid Other | Attending: Internal Medicine | Admitting: Dietician

## 2022-12-12 DIAGNOSIS — E1122 Type 2 diabetes mellitus with diabetic chronic kidney disease: Secondary | ICD-10-CM | POA: Insufficient documentation

## 2022-12-12 DIAGNOSIS — N1832 Chronic kidney disease, stage 3b: Secondary | ICD-10-CM | POA: Insufficient documentation

## 2022-12-12 DIAGNOSIS — Z713 Dietary counseling and surveillance: Secondary | ICD-10-CM | POA: Diagnosis not present

## 2022-12-12 NOTE — Progress Notes (Signed)
Diabetes Self-Management Education  Visit Type: First/Initial  Appt. Start Time: 900 Appt. End Time: 1005  12/12/2022  [name], identified by name and date of birth, is a 62 y.o. male with a diagnosis of Diabetes: Type 2.   ASSESSMENT  Patient is here today with his wife, Ralph Hurst.  Patient would like to learn more about diabetes.   Patient lives with his wife.  Pt and wife share shopping and cooking.  Limits red meat, consumes Charity fundraiser   History includes:   Past Medical History:  Diagnosis Date   Diabetes mellitus without complication (HCC)    Essential hypertension 09/14/2006   Qualifier: Diagnosis of   By: Tawanna Cooler, RN, Ellen       Hyperlipidemia    Hypertension    Need for immunization against influenza 10/11/2017   Polycythemia    Routine general medical examination at a health care facility 10/11/2017   Sleep stage dysfunction 10/17/2016   Labs noted:   Lab Results  Component Value Date   HGBA1C 6.8 (H) 12/01/2022   Lab Results  Component Value Date   CHOL 97 05/25/2022   HDL 30.80 (L) 05/25/2022   LDLCALC 36 05/25/2022   LDLDIRECT 108.0 10/11/2017   TRIG 151.0 (H) 05/25/2022   CHOLHDL 3 05/25/2022   Medications include:   Current Outpatient Medications:    amLODipine (NORVASC) 10 MG tablet, Take 1 tablet (10 mg total) by mouth daily., Disp: 90 tablet, Rfl: 3   aspirin EC 81 MG tablet, Take 1 tablet (81 mg total) by mouth daily., Disp: 90 tablet, Rfl: 3   atenolol (TENORMIN) 100 MG tablet, Take 1 tablet (100 mg total) by mouth daily., Disp: 90 tablet, Rfl: 4   Cyanocobalamin (B-12) 1000 MCG SUBL, Place 1 tablet under the tongue daily at 6 (six) AM., Disp: 90 tablet, Rfl: 3   doxycycline (VIBRA-TABS) 100 MG tablet, Take 1 tablet (100 mg total) by mouth 2 (two) times daily., Disp: 20 tablet, Rfl: 0   losartan (COZAAR) 100 MG tablet, Take 1 tablet (100 mg total) by mouth daily., Disp: 90 tablet, Rfl: 3   metFORMIN (GLUCOPHAGE) 1000 MG tablet,  Take 1,000 mg by mouth daily with breakfast., Disp: , Rfl:    rosuvastatin (CRESTOR) 10 MG tablet, Take 1 tablet (10 mg total) by mouth daily., Disp: 90 tablet, Rfl: 3   Semaglutide,0.25 or 0.5MG /DOS, (OZEMPIC, 0.25 OR 0.5 MG/DOSE,) 2 MG/3ML SOPN, Inject 0.25 mg into the skin once a week., Disp: 3 mL, Rfl: 5   spironolactone (ALDACTONE) 50 MG tablet, Take 1 tablet (50 mg total) by mouth daily., Disp: 903 tablet, Rfl: 3   glucose blood (IGLUCOSE TEST STRIPS) test strip, Use to test blood glucose as directed. (Patient not taking: Reported on 12/12/2022), Disp: 30 each, Rfl: 0   glucose blood (ONETOUCH VERIO) test strip, Use once daily.  Dx E11.9 (Patient not taking: Reported on 12/12/2022), Disp: 100 each, Rfl: 3   Lancets (ONETOUCH ULTRASOFT) lancets, Use once daily. Dx E11.9 (Patient not taking: Reported on 12/12/2022), Disp: 100 each, Rfl: 3   There were no vitals taken for this visit. There is no height or weight on file to calculate BMI.  BMI Readings from Last 3 Encounters:  12/01/22 33.24 kg/m  06/01/22 32.95 kg/m  05/11/22 32.60 kg/m    Diabetes Self-Management Education - 12/12/22 0902       Visit Information   Visit Type First/Initial      Initial Visit   Diabetes Type Type 2  Date Diagnosed 2018    Are you currently following a meal plan? No    Are you taking your medications as prescribed? Yes      Health Coping   How would you rate your overall health? Good      Psychosocial Assessment   Patient Belief/Attitude about Diabetes Motivated to manage diabetes    What is the hardest part about your diabetes right now, causing you the most concern, or is the most worrisome to you about your diabetes?   Other (comment);Making healty food and beverage choices    Self-care barriers None    Self-management support Doctor's office    Other persons present Patient;Spouse/SO    Patient Concerns Nutrition/Meal planning;Healthy Lifestyle;Problem Solving    Special Needs None     Preferred Learning Style No preference indicated    Learning Readiness Ready    How often do you need to have someone help you when you read instructions, pamphlets, or other written materials from your doctor or pharmacy? 1 - Never    What is the last grade level you completed in school? 12th      Pre-Education Assessment   Patient understands the diabetes disease and treatment process. Needs Instruction    Patient understands incorporating nutritional management into lifestyle. Needs Instruction    Patient undertands incorporating physical activity into lifestyle. Needs Instruction    Patient understands using medications safely. Needs Instruction    Patient understands monitoring blood glucose, interpreting and using results Needs Instruction    Patient understands prevention, detection, and treatment of acute complications. Needs Instruction    Patient understands prevention, detection, and treatment of chronic complications. Needs Instruction    Patient understands how to develop strategies to address psychosocial issues. Needs Instruction    Patient understands how to develop strategies to promote health/change behavior. Needs Instruction      Complications   Last HgB A1C per patient/outside source 6.8 %    How often do you check your blood sugar? 0 times/day (not testing)    Number of hypoglycemic episodes per month 0    Have you had a dilated eye exam in the past 12 months? Yes    Have you had a dental exam in the past 12 months? No    Are you checking your feet? No      Dietary Intake   Breakfast 2 slices low sodium bacon, 1 egg made with PAM, [place] cheese, 1 bagel, coffee with 1/2 teaspoon    Lunch peanut butter, SF jelly or banana, 2 slices of honey wheat bread, or potato roll with [place], cheese, mayo, water, low salt chips, cookies    Dinner 1 soft flour tortilla, venision, lettuce, sour cream, tomatoes, hot sauce, cheese    Beverage(s) coffee with half and half and 1/2  teaspoon, water, bud light beer, liqour with gingerale      Activity / Exercise   Activity / Exercise Type ADL's    How many days per week do you exercise? 0    How many minutes per day do you exercise? 0    Total minutes per week of exercise 0      Patient Education   Previous Diabetes Education No    Disease Pathophysiology Definition of diabetes, type 1 and 2, and the diagnosis of diabetes;Factors that contribute to the development of diabetes;Explored patient's options for treatment of their diabetes    Healthy Eating Plate Method;Meal options for control of blood glucose level and chronic complications.;Effects of  alcohol on blood glucose and safety factors with consumption of alcohol.;Role of diet in the treatment of diabetes and the relationship between the three main macronutrients and blood glucose level    Being Active Role of exercise on diabetes management, blood pressure control and cardiac health.;Helped patient identify appropriate exercises in relation to his/her diabetes, diabetes complications and other health issue.    Medications Reviewed patients medication for diabetes, action, purpose, timing of dose and side effects.    Monitoring Identified appropriate SMBG and/or A1C goals.;Daily foot exams    Acute complications Taught prevention, symptoms, and  treatment of hypoglycemia - the 15 rule.;Discussed and identified patients' prevention, symptoms, and treatment of hyperglycemia.    Chronic complications Relationship between chronic complications and blood glucose control;Identified and discussed with patient  current chronic complications    Diabetes Stress and Support Worked with patient to identify barriers to care and solutions    Lifestyle and Health Coping Helped patient develop diabetes management plan for (enter comment);Lifestyle issues that need to be addressed for better diabetes care      Individualized Goals (developed by patient)   Nutrition Follow meal plan  discussed    Physical Activity Exercise 1-2 times per week;15 minutes per day;30 minutes per day    Medications take my medication as prescribed    Monitoring  Not Applicable    Problem Solving Addressing barriers to behavior change    Reducing Risk do foot checks daily    Health Coping Discuss barriers to diabetes care with support person/system (comment specifics as needed)      Post-Education Assessment   Patient understands the diabetes disease and treatment process. Needs Review    Patient understands incorporating nutritional management into lifestyle. Needs Review    Patient undertands incorporating physical activity into lifestyle. Needs Review    Patient understands using medications safely. Needs Review    Patient understands monitoring blood glucose, interpreting and using results Needs Review    Patient understands prevention, detection, and treatment of acute complications. Needs Review    Patient understands prevention, detection, and treatment of chronic complications. Needs Review    Patient understands how to develop strategies to address psychosocial issues. Needs Review    Patient understands how to develop strategies to promote health/change behavior. Needs Review      Outcomes   Expected Outcomes Demonstrated interest in learning. Expect positive outcomes    Future DMSE 2 months    Program Status Not Completed             Individualized Plan for Diabetes Self-Management Training:   Learning Objective:  Patient will have a greater understanding of diabetes self-management. Patient education plan is to attend individual and/or group sessions per assessed needs and concerns.   Plan:   Patient Instructions  1- Start Walking Tuesdays and Thursdays for 10-30 minutes as tolerated  Increase to 150 minutes weekly as tolerated  Expected Outcomes:  Demonstrated interest in learning. Expect positive outcomes  Education material provided: ADA - How to Thrive: A  Guide for Your Journey with Diabetes, My Plate, Snack sheet, and Diabetes Resources  If problems or questions, patient to contact team via:  Phone  Future DSME appointment: 2 months

## 2022-12-12 NOTE — Patient Instructions (Signed)
1- Start Walking Tuesdays and Thursdays for 10-30 minutes as tolerated  Increase to 150 minutes weekly as tolerated

## 2022-12-24 ENCOUNTER — Ambulatory Visit
Admission: RE | Admit: 2022-12-24 | Discharge: 2022-12-24 | Disposition: A | Discharge status: Home / Self Care | Payer: Medicaid Other | Source: Ambulatory Visit | Attending: Internal Medicine | Admitting: Internal Medicine

## 2022-12-24 DIAGNOSIS — R413 Other amnesia: Secondary | ICD-10-CM

## 2022-12-24 MED ORDER — GADOPICLENOL 0.5 MMOL/ML IV SOLN
10.0000 mL | Freq: Once | INTRAVENOUS | Status: AC | PRN
  Administered 2022-12-24: 10 mL via INTRAVENOUS

## 2023-01-02 ENCOUNTER — Ambulatory Visit (INDEPENDENT_AMBULATORY_CARE_PROVIDER_SITE_OTHER): Payer: Medicaid Other | Admitting: Neurology

## 2023-01-02 ENCOUNTER — Encounter: Payer: Self-pay | Admitting: Neurology

## 2023-01-02 VITALS — BP 120/71 | HR 56 | Ht 70.0 in | Wt 232.0 lb

## 2023-01-02 DIAGNOSIS — R0681 Apnea, not elsewhere classified: Secondary | ICD-10-CM | POA: Diagnosis not present

## 2023-01-02 DIAGNOSIS — R0683 Snoring: Secondary | ICD-10-CM | POA: Diagnosis not present

## 2023-01-02 DIAGNOSIS — E66811 Obesity, class 1: Secondary | ICD-10-CM

## 2023-01-02 DIAGNOSIS — R351 Nocturia: Secondary | ICD-10-CM | POA: Diagnosis not present

## 2023-01-02 DIAGNOSIS — F17200 Nicotine dependence, unspecified, uncomplicated: Secondary | ICD-10-CM

## 2023-01-02 DIAGNOSIS — Z820 Family history of epilepsy and other diseases of the nervous system: Secondary | ICD-10-CM | POA: Diagnosis not present

## 2023-01-02 NOTE — Patient Instructions (Signed)

## 2023-01-02 NOTE — Progress Notes (Signed)
Subjective:    Patient ID: Ralph Hurst is a 62 y.o. male.  HPI    Huston Foley, MD, PhD [facility] [address] [address] [place], [place] [address]  Dear Dr. Jon Billings,   I saw your patient, Ralph Hurst, upon your kind request in my clinic today for initial consultation of his sleep disorder, in particular, concern for underlying obstructive sleep apnea.  The patient is accompanied by his wife today.  As you know, Ralph Hurst is a 62 year old male with an underlying medical history of diabetes, hypertension, hyperlipidemia, polycythemia, smoking, daily alcohol consumption, memory loss, B12 deficiency and obesity, who reports snoring and sleep disruption.  His wife has noticed pauses in his breathing while he is asleep.  He has a family history of sleep apnea, both parents had obstructive sleep apnea, his 16 year old son also has a CPAP machine.  He works full-time in [employer].  He has 2 grown sons.  Bedtime is generally between 9:30 PM and 10 PM and rise time between 7:30 and 8.  He has nocturia about twice per average night.  Denies recurrent nocturnal or morning headaches.  He lives with his wife.  They have currently no pets in the household.  He drinks caffeine in the form of coffee, about 2 cups/day, he drinks alcohol daily in the form of beer and liquor, about 2 servings each.  She smokes about half a pack per day.  He recently started Ozempic.  They do have a TV in the bedroom and it tends to be on at night but they do not keep it on all night.  I reviewed your office note from 12/01/2022  His Past Medical History Is Significant For: Past Medical History:  Diagnosis Date   Diabetes mellitus without complication (HCC)    Essential hypertension 09/14/2006   Qualifier: Diagnosis of   By: Tawanna Cooler, RN, Ellen       Hyperlipidemia    Hypertension    Need for immunization against influenza 10/11/2017   Polycythemia    Routine general medical  examination at a health care facility 10/11/2017   Sleep stage dysfunction 10/17/2016    His Past Surgical History Is Significant For: History reviewed. No pertinent surgical history.  His Family History Is Significant For: Family History  Problem Relation Age of Onset   Kidney disease Mother    Hyperlipidemia Mother    Hearing loss Mother    COPD Mother    Hypertension Mother    Diabetes Mother    Asthma Mother    Sleep apnea Mother    Sleep apnea Father    Hyperlipidemia Father    Asthma Father    Hypertension Father    Hypertension Sister    Hyperlipidemia Sister    Diabetes Sister    Hypertension Brother    Hearing loss Brother    COPD Brother    Sleep apnea Son     His Social History Is Significant For: Social History   Socioeconomic History   Marital status: Married    Spouse name: Not on file   Number of children: Not on file   Years of education: Not on file   Highest education level: Not on file  Occupational History   Not on file  Tobacco Use   Smoking status: Every Day    Current packs/day: 0.50    Types: Cigarettes   Smokeless tobacco: Never  Vaping Use   Vaping status: Never Used  Substance and Sexual Activity  Alcohol use: Yes    Alcohol/week: 12.0 standard drinks of alcohol    Types: 8 Cans of beer, 4 Shots of liquor per week   Drug use: No   Sexual activity: Yes  Other Topics Concern   Not on file  Social History Narrative   Caffeine: 2 cups/day in the morning   Social Determinants of Health   Financial Resource Strain: Not on file  Food Insecurity: No Food Insecurity (12/12/2022)   Hunger Vital Sign    Worried About Running Out of Food in the Last Year: Never true    Ran Out of Food in the Last Year: Never true  Transportation Needs: Not on file  Physical Activity: Not on file  Stress: Not on file  Social Connections: Not on file    His Allergies Are:  No Known Allergies:   His Current Medications Are:  Outpatient Encounter  Medications as of 01/02/2023  Medication Sig   amLODipine (NORVASC) 10 MG tablet Take 1 tablet (10 mg total) by mouth daily.   aspirin EC 81 MG tablet Take 1 tablet (81 mg total) by mouth daily.   atenolol (TENORMIN) 100 MG tablet Take 1 tablet (100 mg total) by mouth daily.   Cyanocobalamin (B-12) 1000 MCG SUBL Place 1 tablet under the tongue daily at 6 (six) AM.   losartan (COZAAR) 100 MG tablet Take 1 tablet (100 mg total) by mouth daily.   metFORMIN (GLUCOPHAGE) 1000 MG tablet Take 1,000 mg by mouth daily with breakfast.   rosuvastatin (CRESTOR) 10 MG tablet Take 1 tablet (10 mg total) by mouth daily.   Semaglutide,0.25 or 0.5MG /DOS, (OZEMPIC, 0.25 OR 0.5 MG/DOSE,) 2 MG/3ML SOPN Inject 0.25 mg into the skin once a week.   spironolactone (ALDACTONE) 50 MG tablet Take 1 tablet (50 mg total) by mouth daily.   doxycycline (VIBRA-TABS) 100 MG tablet Take 1 tablet (100 mg total) by mouth 2 (two) times daily. (Patient not taking: Reported on 01/02/2023)   glucose blood (IGLUCOSE TEST STRIPS) test strip Use to test blood glucose as directed. (Patient not taking: Reported on 12/12/2022)   glucose blood (ONETOUCH VERIO) test strip Use once daily.  Dx E11.9 (Patient not taking: Reported on 12/12/2022)   Lancets (ONETOUCH ULTRASOFT) lancets Use once daily. Dx E11.9 (Patient not taking: Reported on 12/12/2022)   No facility-administered encounter medications on file as of 01/02/2023.  :   Review of Systems:  Out of a complete 14 point review of systems, all are reviewed and negative with the exception of these symptoms as listed below:  Review of Systems  Neurological:        Patient is here with his wife for sleep consult. He snores, falls asleep on the couch easily per his wife. She has never noticed any apneic events. She does note that if he lays on his side he stops snoring. His parents and his son have sleep apnea.  ESS 8 FSS 27    Objective:  Neurological Exam  Physical Exam Physical  Examination:   Vitals:   01/02/23 1417  BP: 120/71  Pulse: (!) 56    General Examination: The patient is a very pleasant 62 y.o. male in no acute distress. He appears well-developed and well-nourished and well groomed.   HEENT: Normocephalic, atraumatic, pupils are equal, round and reactive to light, extraocular tracking is good without limitation to gaze excursion or nystagmus noted. Hearing is grossly intact. Face is symmetric with normal facial animation. Speech is clear with no dysarthria noted.  There is no hypophonia. There is no lip, neck/head, jaw or voice tremor. Neck is supple with full range of passive and active motion. There are no carotid bruits on auscultation. Oropharynx exam reveals: mild mouth dryness, adequate dental hygiene and moderate airway crowding, due to small airway entry, Mallampati class III, redundant soft palate noted, tonsillar size about 1+ bilaterally.  Neck circumference 17 three-quarter inches, no significant/minimal overbite noted.  Tongue protrudes centrally and palate elevates symmetrically.  Chest: Clear without crackles.  Scattered expiratory wheezing noted.    Heart: S1+S2+0, regular and normal without murmurs, rubs or gallops noted.   Abdomen: Soft, non-tender and non-distended.  Extremities: There is trace pitting edema in the distal lower extremities bilaterally.   Skin: Warm and dry without trophic changes noted.   Musculoskeletal: exam reveals no obvious joint deformities.   Neurologically:  Mental status: The patient is awake, alert and oriented in all 4 spheres. His immediate and remote memory, attention, language skills and fund of knowledge are appropriate. There is no evidence of aphasia, agnosia, apraxia or anomia. Speech is clear with normal prosody and enunciation. Thought process is linear. Mood is normal and affect is normal.  Cranial nerves II - XII are as described above under HEENT exam.  Motor exam: Normal bulk, strength and tone  is noted. There is no obvious action or resting tremor.  Fine motor skills and coordination: grossly intact.  Cerebellar testing: No dysmetria or intention tremor. There is no truncal or gait ataxia.  Sensory exam: intact to light touch in the upper and lower extremities.  Gait, station and balance: He stands easily. No veering to one side is noted. No leaning to one side is noted. Posture is age-appropriate and stance is narrow based. Gait shows normal stride length and normal pace. No problems turning are noted.   Assessment and Plan:  In summary, Ralph Hurst is a very pleasant 62 y.o.-year old male with an underlying medical history of diabetes, hypertension, hyperlipidemia, polycythemia, smoking, daily alcohol consumption, memory loss, B12 deficiency and obesity, whose history and physical exam are concerning for sleep disordered breathing, particularly obstructive sleep apnea (OSA).  While a laboratory attended sleep study is typically considered "gold standard" for evaluation of sleep disordered breathing, we mutually agreed to proceed with a home sleep test at this time.   I had a long chat with the patient and his wife about my findings and the diagnosis of sleep apnea, particularly OSA, its prognosis and treatment options. We talked about medical/conservative treatments, surgical interventions and non-pharmacological approaches for symptom control. I explained, in particular, the risks and ramifications of untreated moderate to severe OSA, especially with respect to developing cardiovascular disease down the road, including congestive heart failure (CHF), difficult to treat hypertension, cardiac arrhythmias (particularly A-fib), neurovascular complications including TIA, stroke and dementia. Even type 2 diabetes has, in part, been linked to untreated OSA. Symptoms of untreated OSA may include (but may not be limited to) daytime sleepiness, nocturia (i.e. frequent nighttime urination), memory  problems, mood irritability and suboptimally controlled or worsening mood disorder such as depression and/or anxiety, lack of energy, lack of motivation, physical discomfort, as well as recurrent headaches, especially morning or nocturnal headaches. We talked about the importance of maintaining a healthy lifestyle and striving for healthy weight.  The importance of complete smoking cessation was also addressed.  In addition, we talked about the importance of striving for and maintaining good sleep hygiene. I recommended a sleep study at this time. I  outlined the differences between a laboratory attended sleep study which is considered more comprehensive and accurate over the option of a home sleep test (HST); the latter may lead to underestimation of sleep disordered breathing in some instances and does not help with diagnosing upper airway resistance syndrome and is not accurate enough to diagnose primary central sleep apnea typically. I outlined possible surgical and non-surgical treatment options of OSA, including the use of a positive airway pressure (PAP) device (i.e. CPAP, AutoPAP/APAP or BiPAP in certain circumstances), a custom-made dental device (aka oral appliance, which would require a referral to a specialist dentist or orthodontist typically, and is generally speaking not considered for patients with full dentures or edentulous state), upper airway surgical options, such as traditional UPPP (which is not considered a first-line treatment) or the Inspire device (hypoglossal nerve stimulator, which would involve a referral for consultation with an ENT surgeon, after careful selection, following inclusion criteria - also not first-line treatment). I explained the PAP treatment option to the patient in detail, as this is generally considered first-line treatment.  The patient indicated that he would be willing to try PAP therapy, if the need arises. I explained the importance of being compliant with PAP  treatment, not only for insurance purposes but primarily to improve patient's symptoms symptoms, and for the patient's long term health benefit, including to reduce His cardiovascular risks longer-term.    We will pick up our discussion about the next steps and treatment options after testing.  We will keep them posted as to the test results by phone call and/or MyChart messaging where possible.  We will plan to follow-up in sleep clinic accordingly as well.  I answered all their questions today and the patient and and his wife were in agreement.   I encouraged them to call with any interim questions, concerns, problems or updates or email [place] through MyChart.  Generally speaking, sleep test authorizations may take up to 2 weeks, sometimes less, sometimes longer, the patient is encouraged to get in touch with [place] if they do not hear back from the sleep lab staff directly within the next 2 weeks.  Thank you very much for allowing me to participate in the care of this nice patient. If I can be of any further assistance to you please do not hesitate to call me at [phone].  Sincerely,   Huston Foley, MD, PhD

## 2023-01-08 DIAGNOSIS — Z419 Encounter for procedure for purposes other than remedying health state, unspecified: Secondary | ICD-10-CM | POA: Diagnosis not present

## 2023-01-12 ENCOUNTER — Telehealth: Payer: Self-pay | Admitting: Internal Medicine

## 2023-01-12 NOTE — Telephone Encounter (Signed)
Patient's wife requests to be called with MRI results

## 2023-01-13 NOTE — Telephone Encounter (Signed)
Called and requested that the review of his MRI be expedited.

## 2023-01-15 NOTE — Progress Notes (Signed)
Notify its pretty normal on his MRI but there was some evidence of hardening of the arteries to the brain.  Need to eat heart healthy.

## 2023-01-19 ENCOUNTER — Ambulatory Visit: Payer: Medicaid Other | Admitting: Internal Medicine

## 2023-01-19 ENCOUNTER — Encounter: Payer: Self-pay | Admitting: Internal Medicine

## 2023-01-19 VITALS — BP 129/81 | HR 57 | Ht 70.0 in | Wt 233.2 lb

## 2023-01-19 DIAGNOSIS — R0683 Snoring: Secondary | ICD-10-CM

## 2023-01-19 DIAGNOSIS — S30861A Insect bite (nonvenomous) of abdominal wall, initial encounter: Secondary | ICD-10-CM

## 2023-01-19 DIAGNOSIS — Z0271 Encounter for disability determination: Secondary | ICD-10-CM | POA: Diagnosis not present

## 2023-01-19 DIAGNOSIS — W57XXXS Bitten or stung by nonvenomous insect and other nonvenomous arthropods, sequela: Secondary | ICD-10-CM | POA: Diagnosis not present

## 2023-01-19 DIAGNOSIS — S30861D Insect bite (nonvenomous) of abdominal wall, subsequent encounter: Secondary | ICD-10-CM | POA: Diagnosis not present

## 2023-01-19 DIAGNOSIS — W57XXXD Bitten or stung by nonvenomous insect and other nonvenomous arthropods, subsequent encounter: Secondary | ICD-10-CM

## 2023-01-19 DIAGNOSIS — R413 Other amnesia: Secondary | ICD-10-CM

## 2023-01-19 DIAGNOSIS — S30861S Insect bite (nonvenomous) of abdominal wall, sequela: Secondary | ICD-10-CM

## 2023-01-19 DIAGNOSIS — E538 Deficiency of other specified B group vitamins: Secondary | ICD-10-CM | POA: Diagnosis not present

## 2023-01-19 HISTORY — DX: Insect bite (nonvenomous) of abdominal wall, initial encounter: S30.861A

## 2023-01-19 MED ORDER — B-12 1000 MCG PO TBCR
1.0000 | EXTENDED_RELEASE_TABLET | Freq: Every day | ORAL | 3 refills | Status: DC
Start: 2023-01-19 — End: 2023-05-19

## 2023-01-19 NOTE — Patient Instructions (Signed)
VISIT SUMMARY:  During your follow-up visit, we discussed your ongoing memory impairment, which has worsened over the past year, and the persistent itch at the site of a previous tick bite. We reviewed your MRI results, which were normal, and discussed potential causes and treatments for your memory issues, including B12 deficiency and sleep apnea. We also talked about the possibility of applying for disability support due to your memory impairment.  YOUR PLAN:  -MEMORY IMPAIRMENT: Memory impairment means having difficulty remembering things, which can affect daily activities and work. We will continue B12 supplementation, order a sleep study, refer you to a neurologist, start fish oil (omega-3) supplementation, and support your disability claim.  -B12 DEFICIENCY: B12 deficiency can cause memory problems. You will continue taking B12 tablets as previously prescribed.  -SLEEP APNEA: Sleep apnea is a condition where breathing stops and starts during sleep, which can affect memory. We will conduct a sleep study and consider CPAP therapy if sleep apnea is confirmed.  -TICK BITE: The persistent itch at the site of a previous tick bite may be due to a retained tick head causing a localized reaction. We will consider removing it if the symptoms worsen.  -GENERAL HEALTH MAINTENANCE: For general health, we recommend starting fish oil (omega-3) supplementation.  INSTRUCTIONS:  Please continue taking your B12 tablets and start fish oil (omega-3) supplementation as discussed. We will schedule a sleep study to investigate potential sleep apnea. Follow up with [place] in a few weeks to review the sleep study results and complete disability paperwork. If the itching at the tick bite site worsens, please let us know.It was a pleasure seeing you today! Your health and satisfaction are our top priorities.  Glenetta Hew, MD  Your Providers PCP: Lula Olszewski, MD,  [phone]) Referring Provider: Lula Olszewski, MD,  [phone])     NEXT STEPS: [x]  Early Intervention: Schedule sooner appointment, call our on-call services, or go to emergency room if there is any significant Increase in pain or discomfort New or worsening symptoms Sudden or severe changes in your health [x]  Flexible Follow-Up: We recommend a No follow-ups on file. for optimal routine care. This allows for progress monitoring and treatment adjustments. [x]  Preventive Care: Schedule your annual preventive care visit! It's typically covered by insurance and helps identify potential health issues early. [x]  Lab & X-ray Appointments: Incomplete tests scheduled today, or call to schedule. X-rays: [facility] (M-F, 8:30am-noon or 1pm-5pm). [x]  Medical Information Release: Sign a release form at front desk to obtain relevant medical information we don't have.  MAKING THE MOST OF OUR FOCUSED 20 MINUTE APPOINTMENTS: [x]   Clearly state your top concerns at the beginning of the visit to focus our discussion [x]   If you anticipate you will need more time, please inform the front desk during scheduling - we can book multiple appointments in the same week. [x]   If you have transportation problems- use our convenient video appointments or ask about transportation support. [x]   We can get down to business faster if you use MyChart to update information before the visit and submit non-urgent questions before your visit. Thank you for taking the time to provide details through MyChart.  Let our nurse know and she can import this information into your encounter documents.  Arrival and Wait Times: [x]   Arriving on time ensures that everyone receives prompt attention. [x]   Early morning (8a) and afternoon (1p) appointments tend to have shortest wait times. [x]   Unfortunately, we cannot delay  appointments for late arrivals or hold slots during phone calls.  Getting Answers and Following Up [x]   Simple Questions & Concerns: For  quick questions or basic follow-up after your visit, reach [place] at [phone] or MyChart messaging. [x]   Complex Concerns: If your concern is more complex, scheduling an appointment might be best. Discuss this with the staff to find the most suitable option. [x]   Lab & Imaging Results: We'll contact you directly if results are abnormal or you don't use MyChart. Most normal results will be on MyChart within 2-3 business days, with a review message from Dr. Jon Billings. Haven't heard back in 2 weeks? Need results sooner? Contact us at [phone]. [x]   Referrals: Our referral coordinator will manage specialist referrals. The specialist's office should contact you within 2 weeks to schedule an appointment. Call us if you haven't heard from them after 2 weeks.  Staying Connected [x]   MyChart: Activate your MyChart for the fastest way to access results and message [place]. See the last page of this paperwork for instructions on how to activate.  Bring to Your Next Appointment [x]   Medications: Please bring all your medication bottles to your next appointment to ensure we have an accurate record of your prescriptions. [x]   Health Diaries: If you're monitoring any health conditions at home, keeping a diary of your readings can be very helpful for discussions at your next appointment.  Billing [x]   X-ray & Lab Orders: These are billed by separate companies. Contact the invoicing company directly for questions or concerns. [x]   Visit Charges: Discuss any billing inquiries with our administrative services team.  Your Satisfaction Matters [x]   Share Your Experience: We strive for your satisfaction! If you have any complaints, or preferably compliments, please let Dr. Jon Billings know directly or contact our Practice Administrators, Edwena Felty or [employer], by asking at the front desk.   Reviewing Your Records [x]   Review this early draft of your clinical encounter notes below and the final encounter  summary tomorrow on MyChart after its been completed.  All orders placed so far are visible here: B12 deficiency Assessment & Plan: B12 Deficiency   B12 deficiency, a potential cause of memory impairment, has been addressed with B12 tablets since the last visit. We will continue B12 supplementation.   Memory loss Assessment & Plan: Memory Impairment   Memory impairment has progressively worsened over the past couple of years, with significant deterioration noted in the last year. MRI results are normal, excluding brain tumor or stroke as causes. Differential diagnosis includes B12 deficiency and sleep apnea. We discussed the potential benefits of B12 supplementation and CPAP therapy for sleep apnea, emphasizing the importance of addressing memory issues for daily functioning and work capability. We also discussed the potential for disability support due to memory impairment and the benefits of applying now to maximize future social security benefits. The plan includes continuing B12 supplementation, ordering a sleep study, referring him to a neurologist for memory evaluation, starting fish oil (omega-3) supplementation, supporting his disability claim, and documenting detailed memory issues for the disability examination.   Encounter for disability examination  Memory impairment -     B-12; Take 1 tablet by mouth daily.  Dispense: 90 tablet; Refill: 3 -     Ambulatory referral to Neurology  Snoring Assessment & Plan: Sleep Apnea   Sleep apnea, suspected as a contributing factor to memory impairment and supported by a family history, is currently being investigated. We are awaiting equipment for a sleep  study and discussed the potential benefits of CPAP therapy in improving memory if sleep apnea is confirmed. The plan includes ordering a sleep study and considering CPAP therapy if sleep apnea is confirmed.   Tick bite of abdomen, sequela Assessment & Plan: Tick Bite   He is experiencing  persistent itching at the site of a previous tick bite, suggesting a possible retained tick head causing a localized reaction. We will consider excision if symptoms worsen. This was long after memory issues / unrelated

## 2023-01-19 NOTE — Assessment & Plan Note (Signed)
Memory Impairment   Memory impairment has progressively worsened over the past couple of years, with significant deterioration noted in the last year. MRI results are normal, excluding brain tumor or stroke as causes. Differential diagnosis includes B12 deficiency and sleep apnea. We discussed the potential benefits of B12 supplementation and CPAP therapy for sleep apnea, emphasizing the importance of addressing memory issues for daily functioning and work capability. We also discussed the potential for disability support due to memory impairment and the benefits of applying now to maximize future social security benefits. The plan includes continuing B12 supplementation, ordering a sleep study, referring him to a neurologist for memory evaluation, starting fish oil (omega-3) supplementation, supporting his disability claim, and documenting detailed memory issues for the disability examination.

## 2023-01-19 NOTE — Assessment & Plan Note (Signed)
Sleep Apnea   Sleep apnea, suspected as a contributing factor to memory impairment and supported by a family history, is currently being investigated. We are awaiting equipment for a sleep study and discussed the potential benefits of CPAP therapy in improving memory if sleep apnea is confirmed. The plan includes ordering a sleep study and considering CPAP therapy if sleep apnea is confirmed.

## 2023-01-19 NOTE — Progress Notes (Signed)
[facility]: [phone]   -- Medical Office Visit --  Patient:  Ralph Hurst      Age: 62 y.o.       Sex:  male  Date:   01/19/2023 Today's Healthcare Provider: Lula Olszewski, MD  ==========================================================================    Assessment & Plan Memory loss Memory Impairment   Memory impairment has progressively worsened over the past couple of years, with significant deterioration noted in the last year. MRI results are normal, excluding brain tumor or stroke as causes. Differential diagnosis includes B12 deficiency and sleep apnea. We discussed the potential benefits of B12 supplementation and CPAP therapy for sleep apnea, emphasizing the importance of addressing memory issues for daily functioning and work capability. We also discussed the potential for disability support due to memory impairment and the benefits of applying now to maximize future social security benefits. The plan includes continuing B12 supplementation, ordering a sleep study, referring him to a neurologist for memory evaluation, starting fish oil (omega-3) supplementation, supporting his disability claim, and documenting detailed memory issues for the disability examination. B12 deficiency B12 Deficiency   B12 deficiency, a potential cause of memory impairment, has been addressed with B12 tablets since the last visit. We will continue B12 supplementation. Encounter for disability examination  Memory impairment  Snoring Sleep Apnea   Sleep apnea, suspected as a contributing factor to memory impairment and supported by a family history, is currently being investigated. We are awaiting equipment for a sleep study and discussed the potential benefits of CPAP therapy in improving memory if sleep apnea is confirmed. The plan includes ordering a sleep study and considering CPAP therapy if sleep apnea is confirmed. Tick bite of abdomen, initial  encounter Tick Bite   He is experiencing persistent itching at the site of a previous tick bite, suggesting a possible retained tick head causing a localized reaction. We will consider excision if symptoms worsen. This was long after memory issues / unrelated      Orders Placed During this Encounter:          Ordered    Cyanocobalamin (B-12) 1000 MCG TBCR  Daily        01/19/23 1142    Ambulatory referral to Neurology       Comments: Patient with marked memory loss insidiously since 2022ish although onset unclear but seems progressive. Negative MRI 0/3 on word recall, can't do 100-7 Primary Care Provider (PCP) initiating disability claim request assistance with diagnosis and management.   01/19/23 1142           General Health Maintenance   We will start fish oil (omega-3) supplementation for general health maintenance.  Follow-up   We plan to follow up in a few weeks with disability paperwork and on sleep study results. And frequent memory checks.  Encouraged patient to continue working if still able but function is highly suspicious for disabling. Future Appointments  Date Time Provider Department Center  02/14/2023 10:40 AM Lula Olszewski, MD [facility]  Patient Care Team: Lula Olszewski, MD as PCP - General (Internal Medicine)    SUBJECTIVE: 62 y.o. male who has Hyperlipidemia; Polycythemia, secondary; Tobacco use disorder; Type 2 diabetes with stage 3 chronic kidney disease GFR 30-59 (HCC); Hypertension associated with diabetes (HCC); Snoring; Memory loss; B12 deficiency; Macrocytic anemia; and Obesity due to energy imbalance on their problem list.  Main reasons for visit/main concerns/chief complaint: 1 month follow-up, Memory Loss (Needs new prescription of B12, did not  get the last one that was sent in.), and Ozempic    AI-Extracted: Discussed the use of AI scribe software for clinical note transcription with the patient, who gave verbal consent to  proceed.  History of Present Illness   History of Present Illness The patient, with a history of memory impairment, presents for a follow-up consultation. Over the past couple of years, the patient has noticed a gradual decline in memory, which has significantly worsened in the last year. The patient reports difficulty retaining new information, forgetting measurements for work, and often forgetting the purpose of tasks midway. Despite these challenges, the patient continues to work, albeit with reduced efficiency and increased reliance on note-taking for task completion.  The patient has been taking B12 tablets since the last visit, with no significant improvement in memory function. An MRI was conducted to investigate the cause of the memory impairment, which returned with no abnormalities. The patient denies any daytime drowsiness but admits to occasionally falling asleep while watching television. The patient has a family history of sleep apnea, and a sleep study has been approved but not yet conducted.  The patient also reports a persistent itch at the site of a previous tick bite, despite completing a course of antibiotics. The itchiness is intermittent and the site has not increased in size. The patient has not noticed any other new or concerning symptoms.  In summary, the patient presents with a progressive memory impairment over the past couple of years, which has significantly impacted daily functioning and work performance. The cause of the memory impairment remains unclear, with a normal MRI and ongoing investigations into potential sleep apnea. The patient also reports a persistent itch at the site of a previous tick bite.   Note that patient  has a past medical history of Diabetes mellitus without complication (HCC), Essential hypertension (09/14/2006), Hyperlipidemia, Hypertension, Need for immunization against influenza (10/11/2017), Polycythemia, Routine general medical examination at a  health care facility (10/11/2017), and Sleep stage dysfunction (10/17/2016).  Problem list overviews that were updated at today's visit:No problems updated.  Med reconciliation: Current Outpatient Medications on File Prior to Visit  Medication Sig   amLODipine (NORVASC) 10 MG tablet Take 1 tablet (10 mg total) by mouth daily.   aspirin EC 81 MG tablet Take 1 tablet (81 mg total) by mouth daily.   atenolol (TENORMIN) 100 MG tablet Take 1 tablet (100 mg total) by mouth daily.   doxycycline (VIBRA-TABS) 100 MG tablet Take 1 tablet (100 mg total) by mouth 2 (two) times daily.   glucose blood (IGLUCOSE TEST STRIPS) test strip Use to test blood glucose as directed.   glucose blood (ONETOUCH VERIO) test strip Use once daily.  Dx E11.9   Lancets (ONETOUCH ULTRASOFT) lancets Use once daily. Dx E11.9   losartan (COZAAR) 100 MG tablet Take 1 tablet (100 mg total) by mouth daily.   rosuvastatin (CRESTOR) 10 MG tablet Take 1 tablet (10 mg total) by mouth daily.   Semaglutide,0.25 or 0.5MG /DOS, (OZEMPIC, 0.25 OR 0.5 MG/DOSE,) 2 MG/3ML SOPN Inject 0.25 mg into the skin once a week.   spironolactone (ALDACTONE) 50 MG tablet Take 1 tablet (50 mg total) by mouth daily.   metFORMIN (GLUCOPHAGE) 1000 MG tablet Take 1,000 mg by mouth daily with breakfast. (Patient not taking: Reported on 01/19/2023)   No current facility-administered medications on file prior to visit.   Medications Discontinued During This Encounter  Medication Reason   Cyanocobalamin (B-12) 1000 MCG SUBL    Cyanocobalamin (B-12)  1000 MCG TBCR Reorder     Objective   Physical Exam     01/19/2023   10:52 AM 01/02/2023    2:17 PM 12/01/2022    8:12 AM  Vitals with BMI  Height 5\' 10"  5\' 10"  5' 9.5"  Weight 233 lbs 3 oz 232 lbs 228 lbs 6 oz  BMI 33.46 33.29 33.25  Systolic 129 120 409  Diastolic 81 71 65  Pulse 57 56 60   Wt Readings from Last 10 Encounters:  01/19/23 233 lb 3.2 oz (105.8 kg)  01/02/23 232 lb (105.2 kg)   12/01/22 228 lb 6 oz (103.6 kg)  06/01/22 226 lb 6.4 oz (102.7 kg)  05/11/22 224 lb (101.6 kg)  10/11/17 234 lb (106.1 kg)  10/17/16 232 lb (105.2 kg)  08/01/16 229 lb 9.6 oz (104.1 kg)  06/22/16 233 lb (105.7 kg)  06/15/16 229 lb (103.9 kg)   Vital signs reviewed.  Nursing notes reviewed. Weight trend reviewed. Abnormalities and Problem-Specific physical exam findings:  0/3 on word recall, can't do 100-7 even with rephrasing and trying to make the problem simple  General Appearance:  No acute distress appreciable.   Well-groomed, healthy-appearing male.  Well proportioned with no abnormal fat distribution.  Good muscle tone. Pulmonary:  Normal work of breathing at rest, no respiratory distress apparent.    Musculoskeletal: All extremities are intact.  Neurological:  Awake, alert, oriented, and engaged.  No obvious focal neurological deficits or cognitive impairments.  Sensorium seems unclouded.   Speech is clear and coherent with logical content. Psychiatric:  Appropriate mood, pleasant and cooperative demeanor, thoughtful and engaged during the exam  Results            No results found for any visits on 01/19/23.  Office Visit on 12/01/2022  Component Date Value   Microalb, Ur 12/01/2022 <0.7    Creatinine,U 12/01/2022 111.6    Microalb Creat Ratio 12/01/2022 0.6    Sodium 12/01/2022 136    Potassium 12/01/2022 4.1    Chloride 12/01/2022 100    CO2 12/01/2022 28    Glucose, Bld 12/01/2022 264 (H)    BUN 12/01/2022 18    Creatinine, Ser 12/01/2022 1.41    Total Bilirubin 12/01/2022 0.6    Alkaline Phosphatase 12/01/2022 53    AST 12/01/2022 18    ALT 12/01/2022 21    Total Protein 12/01/2022 6.7    Albumin 12/01/2022 4.2    GFR 12/01/2022 53.21 (L)    Calcium 12/01/2022 9.4    Hgb A1c MFr Bld 12/01/2022 6.8 (H)    WBC 12/01/2022 6.1    RBC 12/01/2022 4.53    Platelets 12/01/2022 197.0    Hemoglobin 12/01/2022 15.3    HCT 12/01/2022 45.9    MCV 12/01/2022 101.4 (H)     MCHC 12/01/2022 33.4    RDW 12/01/2022 12.5    Vitamin B-12 12/01/2022 210 (L)   Office Visit on 06/01/2022  Component Date Value   PSA 06/01/2022 0.69   Lab on 05/25/2022  Component Date Value   Microalb, Ur 05/25/2022 1.0    Creatinine,U 05/25/2022 147.3    Microalb Creat Ratio 05/25/2022 0.7    TSH 05/25/2022 1.67    Hgb A1c MFr Bld 05/25/2022 6.5    WBC 05/25/2022 8.3    RBC 05/25/2022 4.35    Platelets 05/25/2022 206.0    Hemoglobin 05/25/2022 14.9    HCT 05/25/2022 42.1    MCV 05/25/2022 96.9    MCHC 05/25/2022 35.3  RDW 05/25/2022 12.9    Sodium 05/25/2022 139    Potassium 05/25/2022 4.5    Chloride 05/25/2022 104    CO2 05/25/2022 27    Glucose, Bld 05/25/2022 130 (H)    BUN 05/25/2022 15    Creatinine, Ser 05/25/2022 1.25    Total Bilirubin 05/25/2022 0.5    Alkaline Phosphatase 05/25/2022 48    AST 05/25/2022 19    ALT 05/25/2022 26    Total Protein 05/25/2022 6.3    Albumin 05/25/2022 4.2    GFR 05/25/2022 61.71    Calcium 05/25/2022 9.1    Cholesterol 05/25/2022 97    Triglycerides 05/25/2022 151.0 (H)    HDL 05/25/2022 30.80 (L)    VLDL 05/25/2022 30.2    LDL Cholesterol 05/25/2022 36    Total CHOL/HDL Ratio 05/25/2022 3    NonHDL 05/25/2022 66.39    No image results found.   MR Brain W Wo Contrast Result Date: 01/13/2023 CLINICAL DATA:  Memory loss EXAM: MRI HEAD WITHOUT AND WITH CONTRAST TECHNIQUE: Multiplanar, multiecho pulse sequences of the brain and surrounding structures were obtained without and with intravenous contrast. CONTRAST:  10 mL Vueway COMPARISON:  None Available. FINDINGS: Brain: No restricted diffusion to suggest acute or subacute infarct. No abnormal parenchymal or meningeal enhancement. No acute hemorrhage, mass, mass effect, or midline shift. No hydrocephalus or extra-axial collection. Craniocervical junction within normal limits. No hemosiderin deposition to suggest remote hemorrhage. No superficial siderosis. Normal cerebral  volume for age. No disproportionate lobar atrophy. Scattered T2 hyperintense signal in the periventricular white matter and pons, likely the sequela of moderate chronic small vessel ischemic disease. Dilated perivascular spaces in the basal ganglia. Vascular: Normal arterial flow voids. Normal arterial and venous enhancement. Skull and upper cervical spine: Normal marrow signal. Sinuses/Orbits: Mild mucosal thickening in the right maxillary sinus and ethmoid air cells. No acute finding in the orbits. Other: The mastoid air cells are well aerated. IMPRESSION: No acute intracranial process. No etiology is seen for the patient's memory loss. Electronically Signed   By: Wiliam Ke M.D.   On: 01/13/2023 19:39    MR Brain W Wo Contrast Result Date: 01/13/2023 CLINICAL DATA:  Memory loss EXAM: MRI HEAD WITHOUT AND WITH CONTRAST TECHNIQUE: Multiplanar, multiecho pulse sequences of the brain and surrounding structures were obtained without and with intravenous contrast. CONTRAST:  10 mL Vueway COMPARISON:  None Available. FINDINGS: Brain: No restricted diffusion to suggest acute or subacute infarct. No abnormal parenchymal or meningeal enhancement. No acute hemorrhage, mass, mass effect, or midline shift. No hydrocephalus or extra-axial collection. Craniocervical junction within normal limits. No hemosiderin deposition to suggest remote hemorrhage. No superficial siderosis. Normal cerebral volume for age. No disproportionate lobar atrophy. Scattered T2 hyperintense signal in the periventricular white matter and pons, likely the sequela of moderate chronic small vessel ischemic disease. Dilated perivascular spaces in the basal ganglia. Vascular: Normal arterial flow voids. Normal arterial and venous enhancement. Skull and upper cervical spine: Normal marrow signal. Sinuses/Orbits: Mild mucosal thickening in the right maxillary sinus and ethmoid air cells. No acute finding in the orbits. Other: The mastoid air cells are  well aerated. IMPRESSION: No acute intracranial process. No etiology is seen for the patient's memory loss. Electronically Signed   By: Wiliam Ke M.D.   On: 01/13/2023 19:39         Additional Info: This encounter employed real-time, collaborative documentation. The patient actively reviewed and updated their medical record on a shared screen, ensuring transparency and facilitating joint  problem-solving for the problem list, overview, and plan. This approach promotes accurate, informed care. The treatment plan was discussed and reviewed in detail, including medication safety, potential side effects, and all patient questions. We confirmed understanding and comfort with the plan. Follow-up instructions were established, including contacting the office for any concerns, returning if symptoms worsen, persist, or new symptoms develop, and precautions for potential emergency department visits.

## 2023-01-19 NOTE — Assessment & Plan Note (Signed)
Tick Bite   He is experiencing persistent itching at the site of a previous tick bite, suggesting a possible retained tick head causing a localized reaction. We will consider excision if symptoms worsen. This was long after memory issues / unrelated

## 2023-01-19 NOTE — Assessment & Plan Note (Signed)
B12 Deficiency   B12 deficiency, a potential cause of memory impairment, has been addressed with B12 tablets since the last visit. We will continue B12 supplementation.

## 2023-02-08 DIAGNOSIS — Z419 Encounter for procedure for purposes other than remedying health state, unspecified: Secondary | ICD-10-CM | POA: Diagnosis not present

## 2023-02-14 ENCOUNTER — Encounter: Payer: Self-pay | Admitting: Internal Medicine

## 2023-02-14 ENCOUNTER — Ambulatory Visit (INDEPENDENT_AMBULATORY_CARE_PROVIDER_SITE_OTHER): Payer: Medicaid Other | Admitting: Internal Medicine

## 2023-02-14 VITALS — BP 118/70 | HR 57 | Temp 97.3°F | Ht 70.0 in | Wt 237.0 lb

## 2023-02-14 DIAGNOSIS — E1122 Type 2 diabetes mellitus with diabetic chronic kidney disease: Secondary | ICD-10-CM

## 2023-02-14 DIAGNOSIS — R413 Other amnesia: Secondary | ICD-10-CM

## 2023-02-14 DIAGNOSIS — N1831 Chronic kidney disease, stage 3a: Secondary | ICD-10-CM

## 2023-02-14 DIAGNOSIS — R0683 Snoring: Secondary | ICD-10-CM

## 2023-02-14 DIAGNOSIS — E538 Deficiency of other specified B group vitamins: Secondary | ICD-10-CM | POA: Diagnosis not present

## 2023-02-14 DIAGNOSIS — E785 Hyperlipidemia, unspecified: Secondary | ICD-10-CM | POA: Diagnosis not present

## 2023-02-14 DIAGNOSIS — E1159 Type 2 diabetes mellitus with other circulatory complications: Secondary | ICD-10-CM

## 2023-02-14 DIAGNOSIS — I152 Hypertension secondary to endocrine disorders: Secondary | ICD-10-CM | POA: Diagnosis not present

## 2023-02-14 MED ORDER — OZEMPIC (0.25 OR 0.5 MG/DOSE) 2 MG/3ML ~~LOC~~ SOPN: 0.2500 mg | PEN_INJECTOR | SUBCUTANEOUS | 11 refills | Status: DC

## 2023-02-14 NOTE — Assessment & Plan Note (Signed)
 Diarrhea secondary to Ozempic  Intermittent diarrhea, likely secondary to Ozempic , presents as sudden onset loose stools impacting daily activities, with differential considerations including diet changes induced by Ozempic  and potential overflow diarrhea due to constipation, after ruling out other causes. We discussed the long-term benefits of Ozempic  and challenges in insurance approval. The plan is to discontinue Ozempic  for 2 weeks to assess symptom resolution, restart at 0.25 mg if symptoms resolve, consider Imodium for symptomatic relief, increase cheese intake for stool consistency, avoid fiber, and maintain a bowel movement diary to track symptoms and dietary intake.  Type 2 Diabetes Mellitus Type 2 Diabetes Mellitus is managed with Ozempic , emphasizing the importance of its continuation for long-term benefits despite side effects and insurance approval difficulties. We will continue to fill the Ozempic  prescription at 0.25 mg and order a 0.5 mg dose for future use if needed.

## 2023-02-14 NOTE — Assessment & Plan Note (Signed)
 Sleep Apnea Sleep apnea presents with ongoing symptoms, with previous evaluations noting concerns about diet and lung health. We plan to continue with the planned at-home sleep study.

## 2023-02-14 NOTE — Progress Notes (Signed)
 ==============================  [facility]: [phone]   -- Medical Office Visit --  Patient: Ralph Hurst      Age: 63 y.o.       Sex:  male  Date:   02/14/2023 Today's Healthcare Provider: Bernardino KANDICE Cone, MD  ==============================   CHIEF COMPLAINT: 3 week follow-up   SUBJECTIVE: 63 y.o. male who has Hyperlipidemia; Polycythemia, secondary; Tobacco use disorder; Type 2 diabetes with stage 3 chronic kidney disease GFR 30-59 (HCC); Hypertension associated with diabetes (HCC); Snoring; Memory loss; B12 deficiency; Macrocytic anemia; Obesity due to energy imbalance; and Tick bite of abdomen on their problem list.  History of Present Illness The patient, with a history of type 2 diabetes and memory loss, presents with concerns about persistent gastrointestinal symptoms, suspected to be a side effect of Ozempic . The patient reports intermittent episodes of diarrhea, which have significantly impacted his daily activities and work. The onset of these symptoms is unclear, but the patient believes he may be related to the initiation of Ozempic . The patient denies any recent changes in other medications, including metformin , which was discontinued when Ozempic  was started.  The patient also reports a history of tick bite, but denies any worsening of memory issues or other symptoms since the incident. The patient's memory loss, which has been a long-standing issue, appears to be stable with no recent significant changes or worsening.  The patient has been trying to manage his type 2 diabetes with dietary changes, but the impact of these changes on his gastrointestinal symptoms is unclear. The patient also reports a history of alcohol consumption, but has been making efforts to reduce intake.  In addition to these concerns, the patient is also dealing with the process of applying for disability due to his memory loss. The patient is currently  awaiting a neurology appointment, scheduled for the end of March, to further evaluate his memory issues.  The patient's other medical conditions include hypertension, which is well-controlled, and a history of B12 deficiency. The patient is currently on amlodipine  for blood pressure, aspirin , timolol, B12 supplements, doxycycline , ticlosartan, and Crestor . The patient denies any recent changes in these medications.  Past Medical History - Diarrhea - High blood pressure - B12 deficiency - Sleep apnea - Tick bite - Type 2 diabetes - Kidney disease  Medications discussed  - Ozempic  - Amlodipine  - Aspirin  - Timolol - Vitamin B12 - Doxycycline  - Telmisartan - Crestor   Current Outpatient Medications on File Prior to Visit  Medication Sig   amLODipine  (NORVASC ) 10 MG tablet Take 1 tablet (10 mg total) by mouth daily.   aspirin  EC 81 MG tablet Take 1 tablet (81 mg total) by mouth daily.   atenolol  (TENORMIN ) 100 MG tablet Take 1 tablet (100 mg total) by mouth daily.   Cyanocobalamin  (B-12) 1000 MCG TBCR Take 1 tablet by mouth daily.   doxycycline  (VIBRA -TABS) 100 MG tablet Take 1 tablet (100 mg total) by mouth 2 (two) times daily.   glucose blood (IGLUCOSE TEST STRIPS) test strip Use to test blood glucose as directed.   glucose blood (ONETOUCH VERIO) test strip Use once daily.  Dx E11.9   Lancets (ONETOUCH ULTRASOFT) lancets Use once daily. Dx E11.9   losartan  (COZAAR ) 100 MG tablet Take 1 tablet (100 mg total) by mouth daily.   rosuvastatin  (CRESTOR ) 10 MG tablet Take 1 tablet (10 mg total) by mouth daily.   Semaglutide ,0.25 or 0.5MG /DOS, (OZEMPIC , 0.25 OR 0.5 MG/DOSE,) 2 MG/3ML SOPN Inject 0.25  mg into the skin once a week.   spironolactone  (ALDACTONE ) 50 MG tablet Take 1 tablet (50 mg total) by mouth daily.   No current facility-administered medications on file prior to visit.   Medications Discontinued During This Encounter  Medication Reason   metFORMIN  (GLUCOPHAGE ) 1000 MG  tablet Completed Course      Objective   Physical Exam     02/14/2023   10:37 AM 01/19/2023   10:52 AM 01/02/2023    2:17 PM  Vitals with BMI  Height 5' 10 5' 10 5' 10  Weight 237 lbs 233 lbs 3 oz 232 lbs  BMI 34.01 33.46 33.29  Systolic 118 129 879  Diastolic 70 81 71  Pulse 57 57 56   Wt Readings from Last 10 Encounters:  02/14/23 237 lb (107.5 kg)  01/19/23 233 lb 3.2 oz (105.8 kg)  01/02/23 232 lb (105.2 kg)  12/01/22 228 lb 6 oz (103.6 kg)  06/01/22 226 lb 6.4 oz (102.7 kg)  05/11/22 224 lb (101.6 kg)  10/11/17 234 lb (106.1 kg)  10/17/16 232 lb (105.2 kg)  08/01/16 229 lb 9.6 oz (104.1 kg)  06/22/16 233 lb (105.7 kg)   Vital signs reviewed.  Nursing notes reviewed. Weight trend reviewed. Abnormalities and Problem-Specific physical exam findings:  memory loss  General Appearance:  No acute distress appreciable.   Well-groomed, healthy-appearing male.  Well proportioned with no abnormal fat distribution.  Good muscle tone. Pulmonary:  Normal work of breathing at rest, no respiratory distress apparent. SpO2: 94 %  Musculoskeletal: All extremities are intact.  Neurological:  Awake, alert, oriented, and engaged.  No obvious focal neurological deficits or cognitive impairments.  Sensorium seems unclouded.   Speech is clear and coherent with logical content. Psychiatric:  Appropriate mood, pleasant and cooperative demeanor, thoughtful and engaged during the exam    No results found for any visits on 02/14/23. Office Visit on 12/01/2022  Component Date Value   Microalb, Ur 12/01/2022 <0.7    Creatinine,U 12/01/2022 111.6    Microalb Creat Ratio 12/01/2022 0.6    Sodium 12/01/2022 136    Potassium 12/01/2022 4.1    Chloride 12/01/2022 100    CO2 12/01/2022 28    Glucose, Bld 12/01/2022 264 (H)    BUN 12/01/2022 18    Creatinine, Ser 12/01/2022 1.41    Total Bilirubin 12/01/2022 0.6    Alkaline Phosphatase 12/01/2022 53    AST 12/01/2022 18    ALT 12/01/2022 21     Total Protein 12/01/2022 6.7    Albumin 12/01/2022 4.2    GFR 12/01/2022 53.21 (L)    Calcium  12/01/2022 9.4    Hgb A1c MFr Bld 12/01/2022 6.8 (H)    WBC 12/01/2022 6.1    RBC 12/01/2022 4.53    Platelets 12/01/2022 197.0    Hemoglobin 12/01/2022 15.3    HCT 12/01/2022 45.9    MCV 12/01/2022 101.4 (H)    MCHC 12/01/2022 33.4    RDW 12/01/2022 12.5    Vitamin B-12 12/01/2022 210 (L)   Office Visit on 06/01/2022  Component Date Value   PSA 06/01/2022 0.69   Lab on 05/25/2022  Component Date Value   Microalb, Ur 05/25/2022 1.0    Creatinine,U 05/25/2022 147.3    Microalb Creat Ratio 05/25/2022 0.7    TSH 05/25/2022 1.67    Hgb A1c MFr Bld 05/25/2022 6.5    WBC 05/25/2022 8.3    RBC 05/25/2022 4.35    Platelets 05/25/2022 206.0    Hemoglobin 05/25/2022  14.9    HCT 05/25/2022 42.1    MCV 05/25/2022 96.9    MCHC 05/25/2022 35.3    RDW 05/25/2022 12.9    Sodium 05/25/2022 139    Potassium 05/25/2022 4.5    Chloride 05/25/2022 104    CO2 05/25/2022 27    Glucose, Bld 05/25/2022 130 (H)    BUN 05/25/2022 15    Creatinine, Ser 05/25/2022 1.25    Total Bilirubin 05/25/2022 0.5    Alkaline Phosphatase 05/25/2022 48    AST 05/25/2022 19    ALT 05/25/2022 26    Total Protein 05/25/2022 6.3    Albumin 05/25/2022 4.2    GFR 05/25/2022 61.71    Calcium  05/25/2022 9.1    Cholesterol 05/25/2022 97    Triglycerides 05/25/2022 151.0 (H)    HDL 05/25/2022 30.80 (L)    VLDL 05/25/2022 30.2    LDL Cholesterol 05/25/2022 36    Total CHOL/HDL Ratio 05/25/2022 3    NonHDL 05/25/2022 66.39   No image results found. MR Brain W Wo Contrast Result Date: 01/13/2023 CLINICAL DATA:  Memory loss EXAM: MRI HEAD WITHOUT AND WITH CONTRAST TECHNIQUE: Multiplanar, multiecho pulse sequences of the brain and surrounding structures were obtained without and with intravenous contrast. CONTRAST:  10 mL Vueway  COMPARISON:  None Available. FINDINGS: Brain: No restricted diffusion to suggest acute or  subacute infarct. No abnormal parenchymal or meningeal enhancement. No acute hemorrhage, mass, mass effect, or midline shift. No hydrocephalus or extra-axial collection. Craniocervical junction within normal limits. No hemosiderin deposition to suggest remote hemorrhage. No superficial siderosis. Normal cerebral volume for age. No disproportionate lobar atrophy. Scattered T2 hyperintense signal in the periventricular white matter and pons, likely the sequela of moderate chronic small vessel ischemic disease. Dilated perivascular spaces in the basal ganglia. Vascular: Normal arterial flow voids. Normal arterial and venous enhancement. Skull and upper cervical spine: Normal marrow signal. Sinuses/Orbits: Mild mucosal thickening in the right maxillary sinus and ethmoid air cells. No acute finding in the orbits. Other: The mastoid air cells are well aerated. IMPRESSION: No acute intracranial process. No etiology is seen for the patient's memory loss. Electronically Signed   By: Donald Campion M.D.   On: 01/13/2023 19:39  MR Brain W Wo Contrast Result Date: 01/13/2023 CLINICAL DATA:  Memory loss EXAM: MRI HEAD WITHOUT AND WITH CONTRAST TECHNIQUE: Multiplanar, multiecho pulse sequences of the brain and surrounding structures were obtained without and with intravenous contrast. CONTRAST:  10 mL Vueway  COMPARISON:  None Available. FINDINGS: Brain: No restricted diffusion to suggest acute or subacute infarct. No abnormal parenchymal or meningeal enhancement. No acute hemorrhage, mass, mass effect, or midline shift. No hydrocephalus or extra-axial collection. Craniocervical junction within normal limits. No hemosiderin deposition to suggest remote hemorrhage. No superficial siderosis. Normal cerebral volume for age. No disproportionate lobar atrophy. Scattered T2 hyperintense signal in the periventricular white matter and pons, likely the sequela of moderate chronic small vessel ischemic disease. Dilated perivascular spaces  in the basal ganglia. Vascular: Normal arterial flow voids. Normal arterial and venous enhancement. Skull and upper cervical spine: Normal marrow signal. Sinuses/Orbits: Mild mucosal thickening in the right maxillary sinus and ethmoid air cells. No acute finding in the orbits. Other: The mastoid air cells are well aerated. IMPRESSION: No acute intracranial process. No etiology is seen for the patient's memory loss. Electronically Signed   By: Donald Campion M.D.   On: 01/13/2023 19:39       Assessment & Plan Type 2 diabetes mellitus with stage 3a chronic  kidney disease, without long-term current use of insulin (HCC) Diarrhea secondary to Ozempic  Intermittent diarrhea, likely secondary to Ozempic , presents as sudden onset loose stools impacting daily activities, with differential considerations including diet changes induced by Ozempic  and potential overflow diarrhea due to constipation, after ruling out other causes. We discussed the long-term benefits of Ozempic  and challenges in insurance approval. The plan is to discontinue Ozempic  for 2 weeks to assess symptom resolution, restart at 0.25 mg if symptoms resolve, consider Imodium for symptomatic relief, increase cheese intake for stool consistency, avoid fiber, and maintain a bowel movement diary to track symptoms and dietary intake.  Type 2 Diabetes Mellitus Type 2 Diabetes Mellitus is managed with Ozempic , emphasizing the importance of its continuation for long-term benefits despite side effects and insurance approval difficulties. We will continue to fill the Ozempic  prescription at 0.25 mg and order a 0.5 mg dose for future use if needed. Memory loss Memory Loss Chronic memory loss shows no recent worsening, with a neurological evaluation pending in March and B12 deficiency noted as a potential contributing factor. We discussed the impact of alcohol on B12 absorption and the importance of maintaining B12 levels, planning to continue B12  supplementation, recheck levels after 3 months, monitor for any worsening of memory symptoms, and call neurology for the first available appointment. Hyperlipidemia, acquired  B12 deficiency B12 Deficiency B12 deficiency, with low levels potentially exacerbated by alcohol consumption, was addressed by emphasizing the importance of spacing B12 intake and alcohol consumption. We will continue B12 supplementation and monitor levels, reassessing in 3 months. Hypertension associated with diabetes (HCC) Hypertension Hypertension is well-controlled with the current medication regimen, including amlodipine  and ticlosartan, which we will continue. Snoring Sleep Apnea Sleep apnea presents with ongoing symptoms, with previous evaluations noting concerns about diet and lung health. We plan to continue with the planned at-home sleep study.     Orders Placed During this Encounter:  No orders of the defined types were placed in this encounter.  Meds ordered this encounter  Medications   Semaglutide ,0.25 or 0.5MG /DOS, (OZEMPIC , 0.25 OR 0.5 MG/DOSE,) 2 MG/3ML SOPN    Sig: Inject 0.25 mg into the skin once a week.    Dispense:  2 mL    Refill:  11    General Health Maintenance We discussed the importance of diet and exercise, advising against excessive alcohol consumption due to its impact on B12 absorption and potential contribution to memory loss, encouraging a balanced diet and regular exercise, and advising moderation in alcohol consumption.  Follow-up We plan to schedule a follow-up visit in a few weeks for disability paperwork completion and continue to document symptoms and dietary intake.    This document was synthesized by artificial intelligence (Abridge) using HIPAA-compliant recording of the clinical interaction;   We discussed the use of AI scribe software for clinical note transcription with the patient, who gave verbal consent to proceed.    Additional Info: This encounter employed  state-of-the-art, real-time, collaborative documentation. The patient actively reviewed and assisted in updating their electronic medical record on a shared screen, ensuring transparency and facilitating joint problem-solving for the problem list, overview, and plan. This approach promotes accurate, informed care. The treatment plan was discussed and reviewed in detail, including medication safety, potential side effects, and all patient questions. We confirmed understanding and comfort with the plan. Follow-up instructions were established, including contacting the office for any concerns, returning if symptoms worsen, persist, or new symptoms develop, and precautions for potential emergency department visits.

## 2023-02-14 NOTE — Assessment & Plan Note (Signed)
 Memory Loss Chronic memory loss shows no recent worsening, with a neurological evaluation pending in March and B12 deficiency noted as a potential contributing factor. We discussed the impact of alcohol on B12 absorption and the importance of maintaining B12 levels, planning to continue B12 supplementation, recheck levels after 3 months, monitor for any worsening of memory symptoms, and call neurology for the first available appointment.

## 2023-02-14 NOTE — Assessment & Plan Note (Signed)
 Hypertension Hypertension is well-controlled with the current medication regimen, including amlodipine and ticlosartan, which we will continue.

## 2023-02-14 NOTE — Patient Instructions (Signed)
 VISIT SUMMARY:  During today's visit, we discussed your ongoing gastrointestinal symptoms, which may be related to your Ozempic  medication. We also reviewed your type 2 diabetes management, memory loss, B12 deficiency, hypertension, and sleep apnea. Additionally, we talked about general health maintenance, including diet and exercise recommendations.  YOUR PLAN:  -DIARRHEA SECONDARY TO OZEMPIC : Your intermittent diarrhea is likely a side effect of Ozempic , which is a medication used to manage type 2 diabetes. We will stop Ozempic  for 2 weeks to see if your symptoms improve. If they do, we will restart it at a lower dose of 0.25 mg. You can use Imodium for relief if needed, increase cheese intake to help with stool consistency, avoid fiber, and keep a diary of your bowel movements and diet.  -TYPE 2 DIABETES MELLITUS: Type 2 diabetes is a condition where your body does not use insulin properly, leading to high blood sugar levels. We will continue your Ozempic  prescription at 0.25 mg and have ordered a 0.5 mg dose for future use if needed.  -MEMORY LOSS: Your chronic memory loss has not worsened recently. We are waiting for your neurology appointment in March to further evaluate this. We discussed the impact of alcohol on B12 absorption and will continue your B12 supplements, rechecking levels in 3 months. Please monitor for any worsening of memory symptoms and call neurology for the first available appointment.  -B12 DEFICIENCY: B12 deficiency can cause various symptoms, including memory issues, and can be worsened by alcohol consumption. We will continue your B12 supplements and monitor your levels, reassessing in 3 months. Please try to space out your B12 intake and alcohol consumption.  -HYPERTENSION: Hypertension, or high blood pressure, is well-controlled with your current medications. We will continue your current regimen, including amlodipine  and ticlosartan.  -SLEEP APNEA: Sleep apnea is a  condition where your breathing stops and starts during sleep. We will proceed with the planned at-home sleep study to further evaluate this.  -GENERAL HEALTH MAINTENANCE: We discussed the importance of a balanced diet and regular exercise. Please avoid excessive alcohol consumption as it can affect B12 absorption and potentially worsen memory loss. Aim for moderation in alcohol intake.  INSTRUCTIONS:  Please schedule a follow-up visit in a few weeks for disability paperwork completion. Continue to document your symptoms and dietary intake. If you notice any worsening of your memory symptoms, contact neurology for the first available appointment.

## 2023-02-14 NOTE — Assessment & Plan Note (Signed)
 B12 Deficiency B12 deficiency, with low levels potentially exacerbated by alcohol consumption, was addressed by emphasizing the importance of spacing B12 intake and alcohol consumption. We will continue B12 supplementation and monitor levels, reassessing in 3 months.

## 2023-02-27 ENCOUNTER — Telehealth: Payer: Self-pay | Admitting: Neurology

## 2023-02-27 NOTE — Telephone Encounter (Signed)
Pt's wife states they were told that information would be mailed to them regarding HST. Gave phone number listed on Office visit notes

## 2023-02-28 ENCOUNTER — Ambulatory Visit: Payer: Medicaid Other | Admitting: Neurology

## 2023-02-28 DIAGNOSIS — E66811 Obesity, class 1: Secondary | ICD-10-CM

## 2023-02-28 DIAGNOSIS — G4733 Obstructive sleep apnea (adult) (pediatric): Secondary | ICD-10-CM | POA: Diagnosis not present

## 2023-02-28 DIAGNOSIS — R351 Nocturia: Secondary | ICD-10-CM

## 2023-02-28 DIAGNOSIS — R0681 Apnea, not elsewhere classified: Secondary | ICD-10-CM

## 2023-02-28 DIAGNOSIS — R0683 Snoring: Secondary | ICD-10-CM

## 2023-02-28 DIAGNOSIS — F17200 Nicotine dependence, unspecified, uncomplicated: Secondary | ICD-10-CM

## 2023-02-28 DIAGNOSIS — G4734 Idiopathic sleep related nonobstructive alveolar hypoventilation: Secondary | ICD-10-CM

## 2023-02-28 DIAGNOSIS — Z820 Family history of epilepsy and other diseases of the nervous system: Secondary | ICD-10-CM

## 2023-03-01 ENCOUNTER — Encounter: Payer: Self-pay | Admitting: Internal Medicine

## 2023-03-01 NOTE — Progress Notes (Signed)
See procedure note.

## 2023-03-06 NOTE — Procedures (Signed)
[facility]  HOME SLEEP TEST (Watch PAT) REPORT  STUDY DATE: 02/28/2023  DOB: 1960-06-23  MRN: [id number]  ORDERING CLINICIAN: Huston Foley, MD, PhD   REFERRING CLINICIAN: Lula Olszewski, MD   CLINICAL INFORMATION/HISTORY: 63 year old male with an underlying medical history of diabetes, hypertension, hyperlipidemia, polycythemia, smoking, daily alcohol consumption, memory loss, B12 deficiency and obesity, who reports snoring, sleep disruption and witnessed apneas.    Epworth sleepiness score: 8/24.  BMI: 33.3 kg/m  FINDINGS:   Sleep Summary:   Total Recording Time (hours, min): 9 hours, 35 min  Total Sleep Time (hours, min):  8 hours, 30 min  Percent REM (%):    Inconclusive REM detection    Respiratory Indices:   Calculated pAHI (per hour):  41.2/hour         REM pAHI:    N/A       NREM pAHI: 41.2/hour  Central pAHI: 2.0/hour  Oxygen Saturation Statistics:    Oxygen Saturation (%) Mean: 89%   Minimum oxygen saturation (%):                 73%   O2 Saturation Range (%): 73-97%    O2 Saturation (minutes) <=88%: 34.3 min  Pulse Rate Statistics:   Pulse Mean (bpm):    56/min    Pulse Range (41 - 132/min)   IMPRESSION: OSA (obstructive sleep apnea), severe Nocturnal Hypoxemia  RECOMMENDATION:  This home sleep test demonstrates severe obstructive sleep apnea with a total AHI of 41.2/hour and O2 nadir of 73% with significant time below or at 88% saturation of over 30 minutes for the study, indicating nocturnal hypoxemia. Snoring was detected, in the mild to moderate range, at times louder.  Urgent treatment with positive airway pressure is highly recommended. The patient will be advised to proceed with an autoPAP titration/trial at home. A laboratory attended titration study can be considered in the future for optimization of treatment settings and to improve tolerance and compliance, if needed, down the road. Alternative treatment options  are limited secondary to the severity of the patient's sleep disordered breathing, but may include surgical treatment with an implantable hypoglossal nerve stimulator (in carefully selected candidates, meeting criteria).  Concomitant weight loss is recommended (where clinically appropriate). Please note, that untreated obstructive sleep apnea may carry additional perioperative morbidity. Patients with significant obstructive sleep apnea should receive perioperative PAP therapy and the surgeons and particularly the anesthesiologist should be informed of the diagnosis and the severity of the sleep disordered breathing. The patient should be cautioned not to drive, work at heights, or operate dangerous or heavy equipment when tired or sleepy. Review and reiteration of good sleep hygiene measures should be pursued with any patient. Other causes of the patient's symptoms, including circadian rhythm disturbances, an underlying mood disorder, medication effect and/or an underlying medical problem cannot be ruled out based on this test. Clinical correlation is recommended.  The patient and his referring provider will be notified of the test results. The patient will be seen in follow up in sleep clinic at [facility].  I certify that I have reviewed the raw data recording prior to the issuance of this report in accordance with the standards of the American Academy of Sleep Medicine (AASM).  INTERPRETING PHYSICIAN:   Huston Foley, MD, PhD Medical Director, [facility]) Diplomat, ABPN (Neurology and Sleep)   [facility] [address] [place], [place] [address] [phone]

## 2023-03-06 NOTE — Addendum Note (Signed)
Addended by: Huston Foley on: 03/06/2023 04:41 PM   Modules accepted: Orders

## 2023-03-07 ENCOUNTER — Telehealth: Payer: Self-pay | Admitting: *Deleted

## 2023-03-07 ENCOUNTER — Encounter: Payer: Self-pay | Admitting: Internal Medicine

## 2023-03-07 NOTE — Telephone Encounter (Signed)
-----   Message from Huston Foley sent at 03/06/2023  4:41 PM EST ----- Urgent set up requested on PAP therapy, due to severe OSA. Patient referred by PCP, seen by me on 01/02/2023, patient had a HST on 02/28/2023.    Please call and notify the patient that the recent home sleep test showed obstructive sleep apnea in the severe range. I recommend treatment for this in the form of autoPAP, which means, that we don't have to bring him in for a sleep study with CPAP, but will let him start using a so called autoPAP machine at home, through a DME company (of his choice, or as per insurance requirement). The DME representative will fit the patient with a mask of choice, educate him on how to use the machine, how to put the mask on, etc. I have placed an order in the chart. Please send the order to a local DME, talk to patient, send report to referring MD. Please also reinforce the need for compliance with treatment. We will need a FU in sleep clinic for 10 weeks post-PAP set up, please arrange that with me or one of our NPs.  While waiting for autoPAP set up at home, I recommend patient sleep with HOB mildly elevated between 30-45 degrees and primarily sleep on his sides. Working on wt loss is also highly recommended.   Thanks,   Huston Foley, MD, PhD [facility])

## 2023-03-07 NOTE — Telephone Encounter (Signed)
I called pt, spoke to Reserve, wife of pt (whos is on [place])  and gave the sleep study results.  Pt HST showed severe sleep apnea.  Recommend HST, no other testing at this time.  [facility] will send message to them to start process.  They will do authorization of  machine/supplies and call you to discuss.  Once using  machine and  wear 4hr or more everynight.  Will see for VV initial cpap follow up 2-3 months out for insurance compliance.   Made appt with AL/NP at 05-30-2023 at 1345 VV.  Sent message to [facility].

## 2023-03-08 ENCOUNTER — Encounter: Payer: Self-pay | Admitting: Internal Medicine

## 2023-03-08 ENCOUNTER — Ambulatory Visit (INDEPENDENT_AMBULATORY_CARE_PROVIDER_SITE_OTHER): Payer: Medicaid Other | Admitting: Internal Medicine

## 2023-03-08 VITALS — BP 116/74 | HR 60 | Temp 98.1°F | Ht 70.0 in | Wt 233.2 lb

## 2023-03-08 DIAGNOSIS — R413 Other amnesia: Secondary | ICD-10-CM

## 2023-03-08 DIAGNOSIS — G4733 Obstructive sleep apnea (adult) (pediatric): Secondary | ICD-10-CM

## 2023-03-08 DIAGNOSIS — N1831 Chronic kidney disease, stage 3a: Secondary | ICD-10-CM

## 2023-03-08 DIAGNOSIS — Z5987 Material hardship due to limited financial resources, not elsewhere classified: Secondary | ICD-10-CM

## 2023-03-08 DIAGNOSIS — J3089 Other allergic rhinitis: Secondary | ICD-10-CM

## 2023-03-08 DIAGNOSIS — Z0271 Encounter for disability determination: Secondary | ICD-10-CM

## 2023-03-08 DIAGNOSIS — E1122 Type 2 diabetes mellitus with diabetic chronic kidney disease: Secondary | ICD-10-CM

## 2023-03-08 NOTE — Progress Notes (Unsigned)
==============================  [facility]: [phone]   -- Medical Office Visit --  Patient: Ralph Hurst      Age: 63 y.o.       Sex:  male  Date:   03/08/2023 Today's Healthcare Provider: Lula Olszewski, MD  ==============================   CHIEF COMPLAINT: 3 week follow-up, Medical Management of Chronic Issues, and disability (Planned reason for visit was complete paperwork, but reports it is not obtainable.)  SUBJECTIVE: Background This is a 63 y.o. male who has Hyperlipidemia; Polycythemia, secondary; Tobacco use disorder; Type 2 diabetes with stage 3 chronic kidney disease GFR 30-59 (HCC); Hypertension associated with diabetes (HCC); OSA (obstructive sleep apnea); Memory loss; B12 deficiency; Macrocytic anemia; Obesity due to energy imbalance; Tick bite of abdomen; and Allergic rhinitis on their problem list.  History of Present Illness The patient presents with severe sleep apnea and allergy symptoms.  He has been diagnosed with severe sleep apnea and is in the process of setting up a BiPAP machine. He prefers sleeping on his right side and is hopeful about adjusting to the device. His son had a positive experience with a similar mask system, which encourages him.  He experiences allergy symptoms, including sneezing up to twenty times in a row and a runny nose, which he attributes to weather changes and humidity. His nose runs more frequently than before, especially after showering or going outside. He is unsure about taking antihistamines due to potential interactions with other medications. He acknowledges having moderate allergies and a dry home environment, which may exacerbate his symptoms.  His current medication regimen includes Ozempic for blood sugar management, with plans to increase the dose to 0.5 mg. He experiences gastrointestinal side effects, particularly after taking the injection, which limits his ability to work  outdoors. He manages these side effects with Imodium, taking one pill with the injection to mitigate symptoms.   Reviewed chart records that patient  has a past medical history of Diabetes mellitus without complication (HCC), Essential hypertension (09/14/2006), Hyperlipidemia, Hypertension, Need for immunization against influenza (10/11/2017), Polycythemia, Routine general medical examination at a health care facility (10/11/2017), and Sleep stage dysfunction (10/17/2016).  Discussed Past Medical History - Sleep apnea - Allergies - Snoring    Problem list overviews that were updated at today's visit: Problem  Allergic Rhinitis    Today's Verbally Confirmed Medications - CPAP - BiPAP - Nasal Breathe Right strip - Imodium Current Outpatient Medications on File Prior to Visit  Medication Sig   amLODipine (NORVASC) 10 MG tablet Take 1 tablet (10 mg total) by mouth daily.   aspirin EC 81 MG tablet Take 1 tablet (81 mg total) by mouth daily.   atenolol (TENORMIN) 100 MG tablet Take 1 tablet (100 mg total) by mouth daily.   Cyanocobalamin (B-12) 1000 MCG TBCR Take 1 tablet by mouth daily.   doxycycline (VIBRA-TABS) 100 MG tablet Take 1 tablet (100 mg total) by mouth 2 (two) times daily.   glucose blood (IGLUCOSE TEST STRIPS) test strip Use to test blood glucose as directed.   glucose blood (ONETOUCH VERIO) test strip Use once daily.  Dx E11.9   Lancets (ONETOUCH ULTRASOFT) lancets Use once daily. Dx E11.9   losartan (COZAAR) 100 MG tablet Take 1 tablet (100 mg total) by mouth daily.   rosuvastatin (CRESTOR) 10 MG tablet Take 1 tablet (10 mg total) by mouth daily.   Semaglutide,0.25 or 0.5MG /DOS, (OZEMPIC, 0.25 OR 0.5 MG/DOSE,) 2 MG/3ML SOPN Inject 0.25 mg into the skin  once a week.   Semaglutide,0.25 or 0.5MG /DOS, (OZEMPIC, 0.25 OR 0.5 MG/DOSE,) 2 MG/3ML SOPN Inject 0.25 mg into the skin once a week.   spironolactone (ALDACTONE) 50 MG tablet Take 1 tablet (50 mg total) by mouth daily.    No current facility-administered medications on file prior to visit.  There are no discontinued medications.    Objective   Physical Exam     03/08/2023   11:08 AM 02/14/2023   10:37 AM 01/19/2023   10:52 AM  Vitals with BMI  Height 5\' 10"  5\' 10"  5\' 10"   Weight 233 lbs 3 oz 237 lbs 233 lbs 3 oz  BMI 33.46 34.01 33.46  Systolic 116 118 956  Diastolic 74 70 81  Pulse 60 57 57   Wt Readings from Last 10 Encounters:  03/08/23 233 lb 3.2 oz (105.8 kg)  02/14/23 237 lb (107.5 kg)  01/19/23 233 lb 3.2 oz (105.8 kg)  01/02/23 232 lb (105.2 kg)  12/01/22 228 lb 6 oz (103.6 kg)  06/01/22 226 lb 6.4 oz (102.7 kg)  05/11/22 224 lb (101.6 kg)  10/11/17 234 lb (106.1 kg)  10/17/16 232 lb (105.2 kg)  08/01/16 229 lb 9.6 oz (104.1 kg)   Vital signs reviewed.  Nursing notes reviewed. Weight trend reviewed. Abnormalities and Problem-Specific physical exam findings:  HEENT: Moderate allergies, dry nasal mucosa. Turbinates not significantly swollen. Hair dry.   He is pleasantly confused, similar to prior.  Its not obvious unless he is tested General Appearance:  No acute distress appreciable.   Well-groomed, healthy-appearing male.  Well proportioned with no abnormal fat distribution.  Good muscle tone. Pulmonary:  Normal work of breathing at rest, no respiratory distress apparent. SpO2: 93 %  Musculoskeletal: All extremities are intact.  Neurological:  Awake, alert, oriented, and engaged.    Sensorium seems unclouded.   Speech is clear and coherent with logical content. Psychiatric:  Appropriate mood, pleasant and cooperative demeanor, thoughtful and engaged during the exam    No results found for any visits on 03/08/23. Office Visit on 12/01/2022  Component Date Value   Microalb, Ur 12/01/2022 <0.7    Creatinine,U 12/01/2022 111.6    Microalb Creat Ratio 12/01/2022 0.6    Sodium 12/01/2022 136    Potassium 12/01/2022 4.1    Chloride 12/01/2022 100    CO2 12/01/2022 28    Glucose,  Bld 12/01/2022 264 (H)    BUN 12/01/2022 18    Creatinine, Ser 12/01/2022 1.41    Total Bilirubin 12/01/2022 0.6    Alkaline Phosphatase 12/01/2022 53    AST 12/01/2022 18    ALT 12/01/2022 21    Total Protein 12/01/2022 6.7    Albumin 12/01/2022 4.2    GFR 12/01/2022 53.21 (L)    Calcium 12/01/2022 9.4    Hgb A1c MFr Bld 12/01/2022 6.8 (H)    WBC 12/01/2022 6.1    RBC 12/01/2022 4.53    Platelets 12/01/2022 197.0    Hemoglobin 12/01/2022 15.3    HCT 12/01/2022 45.9    MCV 12/01/2022 101.4 (H)    MCHC 12/01/2022 33.4    RDW 12/01/2022 12.5    Vitamin B-12 12/01/2022 210 (L)   Office Visit on 06/01/2022  Component Date Value   PSA 06/01/2022 0.69   Lab on 05/25/2022  Component Date Value   Microalb, Ur 05/25/2022 1.0    Creatinine,U 05/25/2022 147.3    Microalb Creat Ratio 05/25/2022 0.7    TSH 05/25/2022 1.67    Hgb A1c MFr Bld  05/25/2022 6.5    WBC 05/25/2022 8.3    RBC 05/25/2022 4.35    Platelets 05/25/2022 206.0    Hemoglobin 05/25/2022 14.9    HCT 05/25/2022 42.1    MCV 05/25/2022 96.9    MCHC 05/25/2022 35.3    RDW 05/25/2022 12.9    Sodium 05/25/2022 139    Potassium 05/25/2022 4.5    Chloride 05/25/2022 104    CO2 05/25/2022 27    Glucose, Bld 05/25/2022 130 (H)    BUN 05/25/2022 15    Creatinine, Ser 05/25/2022 1.25    Total Bilirubin 05/25/2022 0.5    Alkaline Phosphatase 05/25/2022 48    AST 05/25/2022 19    ALT 05/25/2022 26    Total Protein 05/25/2022 6.3    Albumin 05/25/2022 4.2    GFR 05/25/2022 61.71    Calcium 05/25/2022 9.1    Cholesterol 05/25/2022 97    Triglycerides 05/25/2022 151.0 (H)    HDL 05/25/2022 30.80 (L)    VLDL 05/25/2022 30.2    LDL Cholesterol 05/25/2022 36    Total CHOL/HDL Ratio 05/25/2022 3    NonHDL 05/25/2022 66.39   No image results found. Home sleep test Result Date: 02/28/2023 Huston Foley, MD     03/06/2023  4:38 PM  [facility] HOME SLEEP TEST (Watch PAT) REPORT STUDY DATE: 02/28/2023 DOB:  1961-01-16 MRN: [id number] ORDERING CLINICIAN: Huston Foley, MD, PhD  REFERRING CLINICIAN: Lula Olszewski, MD CLINICAL INFORMATION/HISTORY: 63 year old male with an underlying medical history of diabetes, hypertension, hyperlipidemia, polycythemia, smoking, daily alcohol consumption, memory loss, B12 deficiency and obesity, who reports snoring, sleep disruption and witnessed apneas.  Epworth sleepiness score: 8/24. BMI: 33.3 kg/m FINDINGS: Sleep Summary: Total Recording Time (hours, min): 9 hours, 35 min Total Sleep Time (hours, min):  8 hours, 30 min Percent REM (%):    Inconclusive REM detection  Respiratory Indices: Calculated pAHI (per hour):  41.2/hour       REM pAHI:    N/A     NREM pAHI: 41.2/hour Central pAHI: 2.0/hour Oxygen Saturation Statistics:  Oxygen Saturation (%) Mean: 89% Minimum oxygen saturation (%):                 73% O2 Saturation Range (%): 73-97%  O2 Saturation (minutes) <=88%: 34.3 min Pulse Rate Statistics: Pulse Mean (bpm):    56/min  Pulse Range (41 - 132/min) IMPRESSION: OSA (obstructive sleep apnea), severe Nocturnal Hypoxemia RECOMMENDATION: This home sleep test demonstrates severe obstructive sleep apnea with a total AHI of 41.2/hour and O2 nadir of 73% with significant time below or at 88% saturation of over 30 minutes for the study, indicating nocturnal hypoxemia. Snoring was detected, in the mild to moderate range, at times louder.  Urgent treatment with positive airway pressure is highly recommended. The patient will be advised to proceed with an autoPAP titration/trial at home. A laboratory attended titration study can be considered in the future for optimization of treatment settings and to improve tolerance and compliance, if needed, down the road. Alternative treatment options are limited secondary to the severity of the patient's sleep disordered breathing, but may include surgical treatment with an implantable hypoglossal nerve stimulator (in carefully selected candidates,  meeting criteria).  Concomitant weight loss is recommended (where clinically appropriate). Please note, that untreated obstructive sleep apnea may carry additional perioperative morbidity. Patients with significant obstructive sleep apnea should receive perioperative PAP therapy and the surgeons and particularly the anesthesiologist should be informed of the diagnosis and the severity of the sleep  disordered breathing. The patient should be cautioned not to drive, work at heights, or operate dangerous or heavy equipment when tired or sleepy. Review and reiteration of good sleep hygiene measures should be pursued with any patient. Other causes of the patient's symptoms, including circadian rhythm disturbances, an underlying mood disorder, medication effect and/or an underlying medical problem cannot be ruled out based on this test. Clinical correlation is recommended. The patient and his referring provider will be notified of the test results. The patient will be seen in follow up in sleep clinic at [facility]. I certify that I have reviewed the raw data recording prior to the issuance of this report in accordance with the standards of the American Academy of Sleep Medicine (AASM). INTERPRETING PHYSICIAN: Huston Foley, MD, PhD Medical Director, [facility]) Diplomat, ABPN (Neurology and Sleep) [facility] [address] [place], [place] [address] [phone]   MR Brain W Wo Contrast Result Date: 01/13/2023 CLINICAL DATA:  Memory loss EXAM: MRI HEAD WITHOUT AND WITH CONTRAST TECHNIQUE: Multiplanar, multiecho pulse sequences of the brain and surrounding structures were obtained without and with intravenous contrast. CONTRAST:  10 mL Vueway COMPARISON:  None Available. FINDINGS: Brain: No restricted diffusion to suggest acute or subacute infarct. No abnormal parenchymal or meningeal enhancement. No acute hemorrhage, mass, mass effect, or midline shift. No  hydrocephalus or extra-axial collection. Craniocervical junction within normal limits. No hemosiderin deposition to suggest remote hemorrhage. No superficial siderosis. Normal cerebral volume for age. No disproportionate lobar atrophy. Scattered T2 hyperintense signal in the periventricular white matter and pons, likely the sequela of moderate chronic small vessel ischemic disease. Dilated perivascular spaces in the basal ganglia. Vascular: Normal arterial flow voids. Normal arterial and venous enhancement. Skull and upper cervical spine: Normal marrow signal. Sinuses/Orbits: Mild mucosal thickening in the right maxillary sinus and ethmoid air cells. No acute finding in the orbits. Other: The mastoid air cells are well aerated. IMPRESSION: No acute intracranial process. No etiology is seen for the patient's memory loss. Electronically Signed   By: Wiliam Ke M.D.   On: 01/13/2023 19:39    Assessment & Plan Memory loss Working towards disability, I support fully based on memory loss. OSA (obstructive sleep apnea) Severe Obstructive Sleep Apnea Severe obstructive sleep apnea requires urgent intervention. BiPAP therapy is recommended to prevent complications such as early dementia, myocardial infarction, and chronic confusion. Acclimation to the BiPAP mask is crucial despite initial discomfort. BiPAP offers benefits over CPAP due to its higher power and adjustability. Alcohol should be avoided as it worsens sleep apnea. Optimizing nasal airflow is important for effective BiPAP therapy. Initiate BiPAP therapy and use a ResMed P10 mask for comfort and a better seal. Until BiPAP equipment is received, raise the head of the bed. Use Breathe Right strips and saline spray to improve nasal airflow. Type 2 diabetes mellitus with stage 3a chronic kidney disease, without long-term current use of insulin (HCC) Type 2 Diabetes Mellitus Type 2 diabetes mellitus management continues with Ozempic. Increase the dose to  0.5 mg to improve glycemic control. Gastrointestinal side effects are addressed with strategies including Imodium. Take Imodium with the Ozempic shot to manage these side effects. Regularly monitor blood glucose levels. Allergic rhinitis due to other allergic trigger, unspecified seasonality Allergic Rhinitis Moderate allergic rhinitis is exacerbated by dry indoor air and environmental factors, causing frequent sneezing and nasal congestion. Manage symptoms with humidification and sinus irrigation. Avoid neti pots due to contamination risks. Use  a humidifier with barometric pressure detection to maintain optimal humidity levels (40-60%). Perform sinus irrigation with saline spray and consider using saline spray with aloe vera for additional moisture. Monitor and adjust home humidity to 40-60%. Encounter for disability examination  Material hardship due to limited financial resources  dditional moisture. Monitor and adjust home humidity to 40-60%.  General Health Maintenance Weight loss is important for managing sleep apnea and diabetes. Continue gradual dietary changes for weight loss and monitor blood pressure regularly.  Follow-up Schedule a follow-up appointment after initiating BiPAP therapy. Monitor the response to the increased Ozempic dose. Follow up on disability paperwork when received from [employer].    Medical Decision Making: 2 or more stable chronic illnesses Diagnosis or treatment significantly limited by social determinants of health    This document was synthesized by artificial intelligence (Abridge) using HIPAA-compliant recording of the clinical interaction;   We discussed the use of AI scribe software for clinical note transcription with the patient, who gave verbal consent to proceed.    Additional Info: This encounter employed state-of-the-art, real-time, collaborative documentation. The patient actively reviewed and assisted in updating their electronic medical  record on a shared screen, ensuring transparency and facilitating joint problem-solving for the problem list, overview, and plan. This approach promotes accurate, informed care. The treatment plan was discussed and reviewed in detail, including medication safety, potential side effects, and all patient questions. We confirmed understanding and comfort with the plan. Follow-up instructions were established, including contacting the office for any concerns, returning if symptoms worsen, persist, or new symptoms develop, and precautions for potential emergency department visits.

## 2023-03-08 NOTE — Telephone Encounter (Signed)
Zott, Hennie Duos, RN; Elsie [place], [place] Got It Thank You       Previous Messages    ----- Message ----- From: Guy Begin, RN Sent: 03/07/2023  12:02 PM EST To: Marlou Porch Zott Subject: new autopap user OSA SEVERE                    Please see order in EPIC  (truly is there this time)  Severe OSA , new autopap  [name] Male, 63 y.o., 02-27-1960 MRN: [id number] Phone: [phone]  [facility]

## 2023-03-08 NOTE — Patient Instructions (Signed)
   ResMed p10 is my favorite mask for side sleepers who need high pressure.    Keeping Your Sinuses Healthy: A Guide to Saline Mists and Nasal Steroids   Introduction   Sinus congestion is a common problem that can make breathing difficult and lead to headaches, facial pressure, and even fatigue. While allergies and infections are common culprits, there are steps you can take to prevent congestion and keep your sinuses healthy. This handout will explain the benefits of using saline nasal misting rinses nightly and nasal steroid sprays for allergies, both as preventative and treatment measures.   Saline Nasal Mists: Your Daily Defense   Imagine a gentle shower cleaning out dust and allergens from your nasal passages.   Saline nasal mists are essentially a saltwater solution delivered in a fine mist. This mist helps in several ways:   Thin mucus: Saline helps loosen thick mucus, making it easier to clear and preventing it from becoming a breeding ground for bacteria. Moisten dry passages: Dry air can irritate nasal passages and make congestion worse. Saline mist adds moisture, soothing irritation and promoting healthy mucus production. Remove allergens and irritants: Dust, pollen, and other allergens can trigger congestion and allergies. Saline mist helps wash away these irritants, reducing inflammation and discomfort. Nightly Rinsing for Optimal Prevention   Regular use of saline nasal mists, particularly at night, can significantly benefit your sinus health. Here's why:   Nighttime drainage: While you sleep, mucus tends to drain more freely. Saline mist used before bed helps loosen mucus, allowing for better drainage and preventing it from accumulating and causing congestion. Preventative approach: Using saline mist nightly can help prevent congestion before it starts, reducing the likelihood of developing sinus infections and allergy flare-ups.   Nasal Steroid Sprays: Targeting Allergic  Inflammation   If allergies are a major contributor to your sinus issues, nasal steroid sprays can be a powerful tool. Here's how they work:   Reduce inflammation: Allergies cause inflammation in the nasal passages, leading to congestion and irritation. Steroid sprays work by reducing this inflammation, allowing you to breathe easier. Targeted relief: Unlike oral allergy medications that can cause drowsiness, nasal steroid sprays deliver medication directly to the affected area, minimizing side effects.   Combining Strategies for Maximum Relief   For optimal results, consider incorporating both saline mist and nasal steroid sprays into your routine:   Start with a saline mist: Use a saline nasal mist to loosen mucus and clear your nasal passages. Follow with a steroid spray (if needed): If you experience allergy symptoms, use a nasal steroid spray after the saline mist. This allows the steroid medication to reach deeper into the now-cleared nasal passages for better absorption.   Remember: Be consistent! Daily or nightly use, as recommended by your doctor, is key to reaping the full benefits of these treatments. Don't overdo it with too many steroid sprays. Just once daily max after nasal saline misting.   By incorporating saline nasal mists and nasal steroid sprays into your routine, you can take a proactive approach to sinus health, preventing congestion and keeping allergies at bay.  Ultimately, this results in less sinus, throat, and ear infections.  You can continue this even when sick as a treatment as well, reducing your dependence on other decongestants.

## 2023-03-09 DIAGNOSIS — J309 Allergic rhinitis, unspecified: Secondary | ICD-10-CM | POA: Insufficient documentation

## 2023-03-09 NOTE — Assessment & Plan Note (Signed)
Working towards disability, I support fully based on memory loss.

## 2023-03-09 NOTE — Assessment & Plan Note (Signed)
Type 2 Diabetes Mellitus Type 2 diabetes mellitus management continues with Ozempic. Increase the dose to 0.5 mg to improve glycemic control. Gastrointestinal side effects are addressed with strategies including Imodium. Take Imodium with the Ozempic shot to manage these side effects. Regularly monitor blood glucose levels.

## 2023-03-09 NOTE — Assessment & Plan Note (Signed)
Severe Obstructive Sleep Apnea Severe obstructive sleep apnea requires urgent intervention. BiPAP therapy is recommended to prevent complications such as early dementia, myocardial infarction, and chronic confusion. Acclimation to the BiPAP mask is crucial despite initial discomfort. BiPAP offers benefits over CPAP due to its higher power and adjustability. Alcohol should be avoided as it worsens sleep apnea. Optimizing nasal airflow is important for effective BiPAP therapy. Initiate BiPAP therapy and use a ResMed P10 mask for comfort and a better seal. Until BiPAP equipment is received, raise the head of the bed. Use Breathe Right strips and saline spray to improve nasal airflow.

## 2023-03-09 NOTE — Assessment & Plan Note (Signed)
Allergic Rhinitis Moderate allergic rhinitis is exacerbated by dry indoor air and environmental factors, causing frequent sneezing and nasal congestion. Manage symptoms with humidification and sinus irrigation. Avoid neti pots due to contamination risks. Use a humidifier with barometric pressure detection to maintain optimal humidity levels (40-60%). Perform sinus irrigation with saline spray and consider using saline spray with aloe vera for additional moisture. Monitor and adjust home humidity to 40-60%.

## 2023-03-11 DIAGNOSIS — Z419 Encounter for procedure for purposes other than remedying health state, unspecified: Secondary | ICD-10-CM | POA: Diagnosis not present

## 2023-03-14 ENCOUNTER — Telehealth: Payer: Self-pay

## 2023-03-14 ENCOUNTER — Other Ambulatory Visit: Payer: Self-pay | Admitting: *Deleted

## 2023-03-14 NOTE — Patient Instructions (Signed)
 Visit Information  Ralph Hurst was given information about Medicaid Managed Care team care coordination services as a part of their [facility] Medicaid benefit. Ralph Hurst verbally consented to engagement with the [facility] Managed Care team.   If you are experiencing a medical emergency, please call 911 or report to your local emergency department or urgent care.   If you have a non-emergency medical problem during routine business hours, please contact your provider's office and ask to speak with a nurse.   For questions related to your [facility] health plan, please call: [phone] or go here:[email or web address]  If you would like to schedule transportation through your [facility] plan, please call the following number at least 2 days in advance of your appointment: [phone].   You can also use the MTM portal or MTM mobile app to manage your rides. Reimbursement for transportation is available through [facility]! For the portal, please go to [email or web address]  Call the [facility] Crisis Line at [phone], at any time, 24 hours a day, 7 days a week. If you are in danger or need immediate medical attention call 911.  If you would like help to quit smoking, call 1-800-QUIT-NOW (616-358-9012) OR Espaol: 1-855-Djelo-Ya (8-144-664-6430) o para ms informacin haga clic aqu or Text READY to 799-599 to register via text  Ralph Hurst,   Please see education materials related to Memory Loss and Sleep Apnea provided by MyChart link.  Patient verbalizes understanding of instructions and care plan provided today and agrees to view in MyChart. Active MyChart status and patient understanding of how to access instructions and care plan via MyChart confirmed with patient.     Telephone follow up appointment with Managed Medicaid care management team member scheduled for:04/13/23 at 9am  Andrea Dimes RN, BSN [place]  Value-Based Care  [facility] RN Care Manager [phone]   Following is a copy of your plan of care:  Care Plan : RN Care Manager Plan of Care  Updates made by Dimes Andrea LABOR, RN since 03/14/2023 12:00 AM     Problem: Health Management needs related to Memory Loss      Long-Range Goal: Development of Plan of Care to address Health Management needs related to Memory Loss   Start Date: 03/14/2023  Expected End Date: 06/12/2023  Note:   Current Barriers:  Chronic Disease Management support and education needs related to Memory Loss   RNCM Clinical Goal(s):  Patient will verbalize understanding of plan for management of Memory Loss as evidenced by Patient/DPR reports attend all scheduled medical appointments: 05/03/23 with Neurology as evidenced by provider documentation in EMR continue to work with RN Care Manager to address care management and care coordination needs related to  Memory Loss as evidenced by adherence to CM Team Scheduled appointments through collaboration with RN Care manager, provider, and care team.   Interventions: Evaluation of current treatment plan related to  self management and patient's adherence to plan as established by provider   Memory Loss  (Status:  New goal.)  Long Term Goal Evaluation of current treatment plan related to  Memory Loss ,  self-management and patient's adherence to plan as established by provider. Discussed plans with patient for ongoing care management follow up and provided patient with direct contact information for care management team Advised patient to attend Neurology Consult on 05/03/23, set alarms and utilize calendars Reviewed medications with patient and discussed option for utilizing [facility] with PCP regarding order for glucometer Social Work referral for  memory loss, alcohol use and financial resources for Seniors Assessed social determinant of health barriers Reviewed member benefits offered by  [facility], advised contacting Member Services [phone]  Provided patient/DPR with [email or web address] to apply for food benefits Referral to LCSW for assistance with Memory Loss, alcohol use and financial resources for seniors Advised cutting back on alcohol consumption, currently drinking 3 beers and bourbon daily  Patient Goals/Self-Care Activities: Take all medications as prescribed Attend all scheduled provider appointments Call provider office for new concerns or questions  Work with the social worker to address care coordination needs and will continue to work with the clinical team to address health care and disease management related needs Limit driving until discussed with provider  Follow Up Plan:  Telephone follow up appointment with care management team member scheduled for:  04/13/23 at 9am

## 2023-03-14 NOTE — Patient Outreach (Signed)
 Medicaid Managed Care   Nurse Care Manager Note  03/14/2023 Name:  Ralph Hurst MRN:  [id number] DOB:  23-May-1960  Ralph Hurst is an 63 y.o. year old male who is a primary patient of Ralph Bernardino MATSU, MD.  The [facility] Managed Care Coordination team was consulted for assistance with:    Memory Loss  Ralph Hurst was given information about Medicaid Managed Care Coordination team services today. Rowe Schwering Designated Party Release (DPR) agreed to services and verbal consent obtained.  Engaged with patient by telephone for initial visit in response to provider referral for case management and/or care coordination services.   Patient is participating in a Managed Medicaid Plan:  Yes  Assessments/Interventions:  Review of past medical history, allergies, medications, health status, including review of consultants reports, laboratory and other test data, was performed as part of comprehensive evaluation and provision of chronic care management services.  SDOH (Social Drivers of Health) assessments and interventions performed: SDOH Interventions    Flowsheet Row Patient Outreach Telephone from 03/14/2023 in [place] [facility] DEPARTMENT Nutrition from 12/12/2022 in [place] [facility] Nutr Diab Ed  - A Dept Of [place]  SDOH Interventions    Food Insecurity Interventions Intervention Not Indicated Intervention Not Indicated  Housing Interventions Intervention Not Indicated --  Transportation Interventions Intervention Not Indicated --  Utilities Interventions Intervention Not Indicated --       Care Plan  No Known Allergies  Medications Reviewed Today     Reviewed by Lucky Andrea LABOR, RN (Registered Nurse) on 03/14/23 at [phone]  Med List Status: <None>   Medication Order Taking? Sig Documenting Provider Last Dose Status Informant  amLODipine  (NORVASC ) 10 MG tablet [id number] Yes Take 1 tablet (10 mg total) by mouth daily. Ralph Bernardino MATSU, MD Taking  Active   aspirin  EC 81 MG tablet [id number] Yes Take 1 tablet (81 mg total) by mouth daily. Ralph Bernardino MATSU, MD Taking Active   atenolol  (TENORMIN ) 100 MG tablet [id number] Yes Take 1 tablet (100 mg total) by mouth daily. Ralph Bernardino MATSU, MD Taking Active   Cyanocobalamin  (B-12) 1000 MCG TBCR [id number] Yes Take 1 tablet by mouth daily. Ralph Bernardino MATSU, MD Taking Active   doxycycline  (VIBRA -TABS) 100 MG tablet [id number] No Take 1 tablet (100 mg total) by mouth 2 (two) times daily.  Patient not taking: Reported on 03/14/2023   Ralph Bernardino MATSU, MD Not Taking Active            Med Note (Anthonymichael Munday A   Tue Mar 14, 2023  9:49 AM) completed  glucose blood (IGLUCOSE TEST STRIPS) test strip [id number] No Use to test blood glucose as directed.  Patient not taking: Reported on 03/14/2023   Krystal Reyes LABOR, MD Not Taking Active   glucose blood [facility] VERIO) test strip [id number] No Use once daily.  Dx E11.9  Patient not taking: Reported on 03/14/2023   Krystal Reyes LABOR, MD Not Taking Active   Lancets [facility] ULTRASOFT) lancets [id number] No Use once daily. Dx E11.9  Patient not taking: Reported on 03/14/2023   Krystal Reyes LABOR, MD Not Taking Active   losartan  (COZAAR ) 100 MG tablet [id number] Yes Take 1 tablet (100 mg total) by mouth daily. Ralph Bernardino MATSU, MD Taking Active   Omega-3 Fatty Acids (FISH OIL) 1200 MG CAPS [id number] Yes Take by mouth. [provider] Taking Active   rosuvastatin  (CRESTOR ) 10 MG tablet [id number] Yes Take 1 tablet (10 mg total) by mouth daily. Ralph Bernardino  G, MD Taking Active   Semaglutide ,0.25 or 0.5MG /DOS, (OZEMPIC , 0.25 OR 0.5 MG/DOSE,) 2 MG/3ML SOPN [id number] Yes Inject 0.25 mg into the skin once a week. Ralph Bernardino MATSU, MD Taking Active   Semaglutide ,0.25 or 0.5MG /DOS, (OZEMPIC , 0.25 OR 0.5 MG/DOSE,) 2 MG/3ML SOPN [id number] Yes Inject 0.25 mg into the skin once a week. Ralph Bernardino MATSU, MD Taking Active   spironolactone  (ALDACTONE ) 50 MG tablet [id number] Yes Take 1  tablet (50 mg total) by mouth daily. Ralph Bernardino MATSU, MD Taking Active             Patient Active Problem List   Diagnosis Date Noted   Allergic rhinitis 03/09/2023   Tick bite of abdomen 01/19/2023   OSA (obstructive sleep apnea) 12/01/2022   Memory loss 12/01/2022   B12 deficiency 12/01/2022   Macrocytic anemia 12/01/2022   Obesity due to energy imbalance 12/01/2022   Type 2 diabetes with stage 3 chronic kidney disease GFR 30-59 (HCC) 10/17/2016   Hypertension associated with diabetes (HCC) 10/17/2016   Hyperlipidemia 10/25/2007   Polycythemia, secondary 09/14/2006   Tobacco use disorder 09/14/2006    Conditions to be addressed/monitored per PCP order:   Memory Loss  Care Plan : RN Care Manager Plan of Care  Updates made by Lucky Andrea LABOR, RN since 03/14/2023 12:00 AM     Problem: Health Management needs related to Memory Loss      Long-Range Goal: Development of Plan of Care to address Health Management needs related to Memory Loss   Start Date: 03/14/2023  Expected End Date: 06/12/2023  Note:   Current Barriers:  Chronic Disease Management support and education needs related to Memory Loss   RNCM Clinical Goal(s):  Patient will verbalize understanding of plan for management of Memory Loss as evidenced by Patient/DPR reports attend all scheduled medical appointments: 05/03/23 with Neurology as evidenced by provider documentation in EMR continue to work with RN Care Manager to address care management and care coordination needs related to  Memory Loss as evidenced by adherence to CM Team Scheduled appointments through collaboration with RN Care manager, provider, and care team.   Interventions: Evaluation of current treatment plan related to  self management and patient's adherence to plan as established by provider   Memory Loss  (Status:  New goal.)  Long Term Goal Evaluation of current treatment plan related to  Memory Loss ,  self-management and patient's adherence  to plan as established by provider. Discussed plans with patient for ongoing care management follow up and provided patient with direct contact information for care management team Advised patient to attend Neurology Consult on 05/03/23, set alarms and utilize calendars Reviewed medications with patient and discussed option for utilizing [facility] with PCP regarding order for glucometer Social Work referral for memory loss, alcohol use and financial resources for Seniors Assessed social determinant of health barriers Reviewed member benefits offered by [facility], advised contacting Member Services [phone]  Provided patient/DPR with [email or web address] to apply for food benefits Referral to LCSW for assistance with Memory Loss, alcohol use and financial resources for seniors Advised cutting back on alcohol consumption, currently drinking 3 beers and bourbon daily  Patient Goals/Self-Care Activities: Take all medications as prescribed Attend all scheduled provider appointments Call provider office for new concerns or questions  Work with the social worker to address care coordination needs and will continue to work with the clinical team to address health care and disease management related needs Limit driving until discussed with provider  Follow Up Plan:  Telephone follow up appointment with care management team member scheduled for:  04/13/23 at 9am      Follow Up:  Patient agrees to Care Plan and Follow-up.  Plan: The Managed Medicaid care management team will reach out to the patient again over the next 30 days.  Date/time of next scheduled RN care management/care coordination outreach:  04/13/23 at 9am  Andrea Dimes RN, BSN [place]  Value-Based Care [facility] RN Care Manager [phone]

## 2023-03-14 NOTE — Progress Notes (Signed)
 Care Guide Pharmacy Note  03/14/2023 Name: Ralph Hurst MRN: [id number] DOB: August 18, 1960  Referred By: Jesus Bernardino MATSU, MD Reason for referral: Care Coordination (Outreach to schedule with pharm d )   Ralph Hurst is a 63 y.o. year old male who is a primary care patient of Jesus Bernardino MATSU, MD.  Ralph Hurst was referred to the pharmacist for assistance related to: DMII  Successful contact was made with the patient to discuss pharmacy services.  Patient declines engagement at this time. Contact information was provided to the patient should they wish to reach out for assistance at a later time.  Ralph Hurst , RMA     [facility]  [facility] Guide  Direct Dial: [phone]  Website: [email or web address]

## 2023-03-15 DIAGNOSIS — G4733 Obstructive sleep apnea (adult) (pediatric): Secondary | ICD-10-CM | POA: Diagnosis not present

## 2023-03-16 ENCOUNTER — Other Ambulatory Visit: Payer: Self-pay | Admitting: Internal Medicine

## 2023-03-16 DIAGNOSIS — E1122 Type 2 diabetes mellitus with diabetic chronic kidney disease: Secondary | ICD-10-CM

## 2023-03-16 MED ORDER — BLOOD GLUCOSE MONITORING SUPPL DEVI: 1.0000 | Freq: Three times a day (TID) | 0 refills | Status: AC

## 2023-03-16 MED ORDER — BLOOD GLUCOSE TEST VI STRP: 1.0000 | ORAL_STRIP | Freq: Three times a day (TID) | 0 refills | Status: AC

## 2023-03-16 MED ORDER — LANCETS MISC. MISC: 1.0000 | Freq: Three times a day (TID) | 0 refills | Status: AC

## 2023-03-16 MED ORDER — LANCET DEVICE MISC: 1.0000 | Freq: Three times a day (TID) | 0 refills | Status: AC

## 2023-03-24 ENCOUNTER — Other Ambulatory Visit: Payer: Self-pay | Admitting: Licensed Clinical Social Worker

## 2023-03-24 NOTE — Patient Outreach (Signed)
Care Coordination  03/24/2023  Ralph Hurst 10/30/1960 [id number]  [facility] LCSW completed initial outreach to patient and wife on 03/24/23. Per referral, family is Holiday representative. Family both decline needing social work support at this time . They were educated on available senior living resources, financial resources and disability advocacy center but denied needing assistance with these and denied needing these to be sent to their email address for review in case they are needed in the future. Family appreciative of call but decline involvement at this time. Family agreeable to update [facility] RNCM or PCP if future social work needs were to arise. [facility] LCSW will sign off at this time and will update involved [facility] team members.  Dickie La, BSW, MSW, LCSW Licensed Clinical Social Worker [employer]   [email or web address] Direct Dial: [phone]

## 2023-04-08 DIAGNOSIS — Z419 Encounter for procedure for purposes other than remedying health state, unspecified: Secondary | ICD-10-CM | POA: Diagnosis not present

## 2023-04-08 DIAGNOSIS — G4733 Obstructive sleep apnea (adult) (pediatric): Secondary | ICD-10-CM | POA: Diagnosis not present

## 2023-04-13 ENCOUNTER — Other Ambulatory Visit: Payer: Self-pay | Admitting: *Deleted

## 2023-04-13 NOTE — Patient Instructions (Addendum)
 Visit Information  Ralph Hurst was given information about Medicaid Managed Care team care coordination services as a part of their [facility] Medicaid benefit. Ralph Hurst verbally consented to engagement with the [facility] Managed Care team.   If you are experiencing a medical emergency, please call 911 or report to your local emergency department or urgent care.   If you have a non-emergency medical problem during routine business hours, please contact your provider's office and ask to speak with a nurse.   For questions related to your [facility] health plan, please call: [phone] or go here:[email or web address]  If you would like to schedule transportation through your [facility] plan, please call the following number at least 2 days in advance of your appointment: [phone].   You can also use the MTM portal or MTM mobile app to manage your rides. Reimbursement for transportation is available through [facility]! For the portal, please go to [email or web address]  Call the [facility] Crisis Line at [phone], at any time, 24 hours a day, 7 days a week. If you are in danger or need immediate medical attention call 911.  If you would like help to quit smoking, call 1-800-QUIT-NOW ((812) 493-9231) OR Espaol: 1-855-Djelo-Ya (7-253-664-4034) o para ms informacin haga clic aqu or Text READY to 742-595 to register via text  Ralph Hurst,   Please see education materials related to DM provided by MyChart link.  Patient verbalizes understanding of instructions and care plan provided today and agrees to view in MyChart. Active MyChart status and patient understanding of how to access instructions and care plan via MyChart confirmed with patient.     Telephone follow up appointment with Managed Medicaid care management team member scheduled for:05/16/23 at 9am  Ralph Emms RN, BSN [place]  Value-Based Care [facility] RN Care  Manager [phone]   Following is a copy of your plan of care:  Care Plan : RN Care Manager Plan of Care  Updates made by Heidi Dach, RN since 04/13/2023 12:00 AM     Problem: Health Management needs related to Memory Loss      Long-Range Goal: Development of Plan of Care to address Health Management needs related to Memory Loss   Start Date: 03/14/2023  Expected End Date: 06/12/2023  Note:   Current Barriers:  Chronic Disease Management support and education needs related to Memory Loss   RNCM Clinical Goal(s):  Patient will verbalize understanding of plan for management of Memory Loss as evidenced by Patient/DPR reports attend all scheduled medical appointments: 05/03/23 and 05/30/23 with Neurology as evidenced by provider documentation in EMR continue to work with RN Care Manager to address care management and care coordination needs related to  Memory Loss as evidenced by adherence to CM Team Scheduled appointments through collaboration with RN Care manager, provider, and care team.   Interventions: Evaluation of current treatment plan related to  self management and patient's adherence to plan as established by provider   Memory Loss  (Status:  Goal on track:  Yes.)  Long Term Goal Evaluation of current treatment plan related to  Memory Loss ,  self-management and patient's adherence to plan as established by provider. Discussed plans with patient for ongoing care management follow up and provided patient with direct contact information for care management team Advised patient to attend Neurology Consult on 05/03/23, set alarms and utilize calendars Reviewed medications with patient and discussed option for utilizing [facility] Assessed social determinant of health barriers Provided patient with [facility]  Opthalmology [phone] to schedule Diabetic Eye Exam Provided patient/DPR with [email or web address] to apply for food benefits-in process of completing  Referral  to LCSW for assistance with Memory Loss, alcohol use and financial resources for seniors-resources received Advised cutting back on alcohol consumption, currently drinking 3 beers and bourbon daily Discussed the importance of exercise, advised 30 minutes/5 days a week Discussed using Autopap, advised using while sleeping at night and during naps Advised patient to increase water intake to 64 ounces a day Discussed the importance of colon cancer screening, advise patient to complete Cologuard and return   Patient Goals/Self-Care Activities: Take all medications as prescribed Attend all scheduled provider appointments Call provider office for new concerns or questions  Work with the social worker to address care coordination needs and will continue to work with the clinical team to address health care and disease management related needs Limit driving until discussed with provider  Follow Up Plan:  Telephone follow up appointment with care management team member scheduled for:  05/16/23 at 9am

## 2023-04-13 NOTE — Patient Outreach (Addendum)
 Medicaid Managed Care   Nurse Care Manager Note  04/13/2023 Name:  Ralph Hurst MRN:  [id number] DOB:  1961/01/26  Ralph Hurst is an 63 y.o. year old male who is a primary patient of Lula Olszewski, MD.  The [facility] Managed Care Coordination team was consulted for assistance with:    Memory Loss  Ralph Hurst was given information about Medicaid Managed Care Coordination team services today. Ralph Hurst Patient agreed to services and verbal consent obtained.  Engaged with patient by telephone for follow up visit in response to provider referral for case management and/or care coordination services.   Patient is participating in a Managed Medicaid Plan:  Yes  Assessments/Interventions:  Review of past medical history, allergies, medications, health status, including review of consultants reports, laboratory and other test data, was performed as part of comprehensive evaluation and provision of chronic care management services.  SDOH (Social Drivers of Health) assessments and interventions performed: SDOH Interventions    Flowsheet Row Patient Outreach Telephone from 03/14/2023 in [place]PART[facility] Nutrition from 12/12/2022 in [facility] Nutr Diab Ed  - A Dept Of [facility]  SDOH Interventions    Food Insecurity Interventions Intervention Not Indicated Intervention Not Indicated  Housing Interventions Intervention Not Indicated --  Transportation Interventions Intervention Not Indicated --  Utilities Interventions Intervention Not Indicated --       Care Plan  No Known Allergies  Medications Reviewed Today     Reviewed by Heidi Dach, RN (Registered Nurse) on 04/13/23 at [phone]  Med List Status: <None>   Medication Order Taking? Sig Documenting Provider Last Dose Status Informant  amLODipine (NORVASC) 10 MG tablet [id number] Yes Take 1 tablet (10 mg total) by mouth daily. Lula Olszewski, MD Taking Active   aspirin EC  81 MG tablet [id number] Yes Take 1 tablet (81 mg total) by mouth daily. Lula Olszewski, MD Taking Active   atenolol (TENORMIN) 100 MG tablet [id number] Yes Take 1 tablet (100 mg total) by mouth daily. Lula Olszewski, MD Taking Active   Blood Glucose Monitoring Suppl DEVI [id number] Yes 1 each by Does not apply route in the morning, at noon, and at bedtime. May substitute to any manufacturer covered by patient's insurance. Lula Olszewski, MD Taking Active   Cyanocobalamin (B-12) 1000 MCG TBCR [id number] Yes Take 1 tablet by mouth daily. Lula Olszewski, MD Taking Active   doxycycline (VIBRA-TABS) 100 MG tablet [id number] No Take 1 tablet (100 mg total) by mouth 2 (two) times daily.  Patient not taking: Reported on 04/13/2023   Lula Olszewski, MD Not Taking Active            Med Note ([place] Vandiver A   Tue Mar 14, 2023  9:49 AM) completed  Glucose Blood (BLOOD GLUCOSE TEST STRIPS) STRP [id number] Yes 1 each by In Vitro route in the morning, at noon, and at bedtime. May substitute to any manufacturer covered by patient's insurance. Lula Olszewski, MD Taking Active   Lancet Device MISC [id number] Yes 1 each by Does not apply route in the morning, at noon, and at bedtime. May substitute to any manufacturer covered by patient's insurance. Lula Olszewski, MD Taking Active   Lancets [facility] ULTRASOFT) lancets [id number]  Use once daily. Dx E11.9  Patient not taking: Reported on 03/14/2023   Roderick Pee, MD  Active   Lancets Misc. MISC [id number] Yes 1 each by Does not apply route in the morning,  at noon, and at bedtime. May substitute to any manufacturer covered by patient's insurance. Lula Olszewski, MD Taking Active   losartan (COZAAR) 100 MG tablet [id number] Yes Take 1 tablet (100 mg total) by mouth daily. Lula Olszewski, MD Taking Active   Omega-3 Fatty Acids (FISH OIL) 1200 MG CAPS [id number] Yes Take by mouth. [provider] Taking Active   rosuvastatin (CRESTOR) 10 MG tablet  [id number] Yes Take 1 tablet (10 mg total) by mouth daily. Lula Olszewski, MD Taking Active   Semaglutide,0.25 or 0.5MG /DOS, (OZEMPIC, 0.25 OR 0.5 MG/DOSE,) 2 MG/3ML SOPN [id number] Yes Inject 0.25 mg into the skin once a week. Lula Olszewski, MD Taking Active   Semaglutide,0.25 or 0.5MG /DOS, (OZEMPIC, 0.25 OR 0.5 MG/DOSE,) 2 MG/3ML SOPN [id number] Yes Inject 0.25 mg into the skin once a week. Lula Olszewski, MD Taking Active   spironolactone (ALDACTONE) 50 MG tablet [id number] Yes Take 1 tablet (50 mg total) by mouth daily. Lula Olszewski, MD Taking Active             Patient Active Problem List   Diagnosis Date Noted   Allergic rhinitis 03/09/2023   Tick bite of abdomen 01/19/2023   OSA (obstructive sleep apnea) 12/01/2022   Memory loss 12/01/2022   B12 deficiency 12/01/2022   Macrocytic anemia 12/01/2022   Obesity due to energy imbalance 12/01/2022   Type 2 diabetes with stage 3 chronic kidney disease GFR 30-59 (HCC) 10/17/2016   Hypertension associated with diabetes (HCC) 10/17/2016   Hyperlipidemia 10/25/2007   Polycythemia, secondary 09/14/2006   Tobacco use disorder 09/14/2006    Conditions to be addressed/monitored per PCP order:   Memory Loss  Care Plan : RN Care Manager Plan of Care  Updates made by Heidi Dach, RN since 04/13/2023 12:00 AM     Problem: Health Management needs related to Memory Loss      Long-Range Goal: Development of Plan of Care to address Health Management needs related to Memory Loss   Start Date: 03/14/2023  Expected End Date: 06/12/2023  Note:   Current Barriers:  Chronic Disease Management support and education needs related to Memory Loss   RNCM Clinical Goal(s):  Patient will verbalize understanding of plan for management of Memory Loss as evidenced by Patient/DPR reports attend all scheduled medical appointments: 05/03/23 and 05/30/23 with Neurology as evidenced by provider documentation in EMR continue to work with RN Care Manager  to address care management and care coordination needs related to  Memory Loss as evidenced by adherence to CM Team Scheduled appointments through collaboration with RN Care manager, provider, and care team.   Interventions: Evaluation of current treatment plan related to  self management and patient's adherence to plan as established by provider   Memory Loss  (Status:  Goal on track:  Yes.)  Long Term Goal Evaluation of current treatment plan related to  Memory Loss ,  self-management and patient's adherence to plan as established by provider. Discussed plans with patient for ongoing care management follow up and provided patient with direct contact information for care management team Advised patient to attend Neurology Consult on 05/03/23, set alarms and utilize calendars Reviewed medications with patient and discussed option for utilizing [facility] Assessed social determinant of health barriers Provided patient with [facility] [phone] to schedule Diabetic Eye Exam Provided patient/DPR with [email or web address] to apply for food benefits-in process of completing  Referral to LCSW for assistance with Memory Loss, alcohol use and financial resources for  seniors-resources received Advised cutting back on alcohol consumption, currently drinking 3 beers and bourbon daily Discussed the importance of exercise, advised 30 minutes/5 days a week Discussed using Autopap, advised using while sleeping at night and during naps Advised patient to increase water intake to 64 ounces a day Discussed the importance of colon cancer screening, advise patient to complete Cologuard and return   Patient Goals/Self-Care Activities: Take all medications as prescribed Attend all scheduled provider appointments Call provider office for new concerns or questions  Work with the social worker to address care coordination needs and will continue to work with the clinical team to address  health care and disease management related needs Limit driving until discussed with provider  Follow Up Plan:  Telephone follow up appointment with care management team member scheduled for:  05/16/23 at 9am      Follow Up:  Patient agrees to Care Plan and Follow-up.  Plan: The Managed Medicaid care management team will reach out to the patient again over the next 30 days.  Date/time of next scheduled RN care management/care coordination outreach:  05/16/23 at 9am  [name] RN, BSN [place]  [facility] RN Care Manager [phone]

## 2023-05-03 ENCOUNTER — Ambulatory Visit (INDEPENDENT_AMBULATORY_CARE_PROVIDER_SITE_OTHER): Payer: Medicaid Other | Admitting: Neurology

## 2023-05-03 ENCOUNTER — Encounter: Payer: Self-pay | Admitting: Neurology

## 2023-05-03 VITALS — BP 98/69 | HR 84 | Ht 70.0 in | Wt 226.0 lb

## 2023-05-03 DIAGNOSIS — Z789 Other specified health status: Secondary | ICD-10-CM

## 2023-05-03 DIAGNOSIS — F17200 Nicotine dependence, unspecified, uncomplicated: Secondary | ICD-10-CM

## 2023-05-03 DIAGNOSIS — Z9189 Other specified personal risk factors, not elsewhere classified: Secondary | ICD-10-CM

## 2023-05-03 DIAGNOSIS — G4734 Idiopathic sleep related nonobstructive alveolar hypoventilation: Secondary | ICD-10-CM

## 2023-05-03 DIAGNOSIS — F09 Unspecified mental disorder due to known physiological condition: Secondary | ICD-10-CM

## 2023-05-03 DIAGNOSIS — R419 Unspecified symptoms and signs involving cognitive functions and awareness: Secondary | ICD-10-CM

## 2023-05-03 DIAGNOSIS — E538 Deficiency of other specified B group vitamins: Secondary | ICD-10-CM | POA: Diagnosis not present

## 2023-05-03 DIAGNOSIS — G4733 Obstructive sleep apnea (adult) (pediatric): Secondary | ICD-10-CM | POA: Diagnosis not present

## 2023-05-03 NOTE — Progress Notes (Signed)
 Subjective:    Patient ID: Ralph Hurst is a 63 y.o. male.  HPI   Interim history:   Dear Dr. Jon Hurst  I saw your patient, Ralph Hurst, upon your kind request in my neurologic clinic today for evaluation of his memory loss.  The patient is accompanied by his wife today.  As you know, Ralph Hurst is a 63 year old male with an underlying medical history of severe obstructive sleep apnea, hypertension, anemia, polycythemia, diabetes, vitamin B-12 deficiency, obesity, and polycythemia, who reports doing well with his autoPAP. He has no major memory related concerns and points to his wife to give his history.  He is not very elaborate about his symptoms, denies any problems performing his job.  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  His wife feels that he has had trouble with comprehension for the past 2 years.  She reports that he has had trouble with measurements at work.  He drives and has not had any trouble driving.  He is not aware of any family history of dementia.  He does not know much about his siblings' medical history, he is estranged from his sister and has not talked to his brother who lives in [place] and about 6 months.  He smokes daily.  He drinks alcohol daily, about 2 beers per day.  He has reduced liquor intake from what I understand.  He does not hydrate very well with water, estimates that he drinks about 2 bottles of water per day.  He has not had any vitamin B12 injections but takes oral vitamin B12.  I reviewed your office note from 01/19/2023.  I reviewed blood work and his electronic chart from October 2024.  His hemoglobin A1c was 6.8, vitamin B12 level was below normal at 210.  His blood sugar was quite elevated at 264.  Creatinine was elevated at 1.41.  He had a brain MRI with and without contrast on 12/24/2022 and I reviewed the results: Impression: No acute intracranial process.  No etiology is seen for the patient's memory loss.  Of note, I had  evaluated him in November 2024 for his sleep disturbance.  He had a home sleep test through our office on 02/28/2023 which showed severe obstructive sleep apnea with a total AHI of 41.2/hour and O2 nadir of 73% with significant time below or at 88% saturation of over 30 minutes for the study, indicating nocturnal hypoxemia. Snoring was detected, in the mild to moderate range, at times louder.  Urgent treatment with positive airway pressure was highly recommended. His AutoPap set up date was 03/15/2023.  He has a ResMed air sense 11 AutoSet machine.  His DME provider is [facility].  I reviewed his AutoPap compliance data from 04/02/2023 through 05/01/2023, which is a total of 30 days, during which time he used his machine every night with percent use days greater than 4 hours at 100%, indicating superb compliance with an average usage of 8 hours and 25 minutes, residual AHI at goal at 1.4/h, average pressure for the 95th percentile at 12.5 cm with a range of 7 to 14 cm with EPR of 3.  Leak acceptable with the 95th percentile at 9.6 L/min.  Previously:  01/02/2023: 63 year old male with an underlying medical history of diabetes, hypertension, hyperlipidemia, polycythemia, smoking, daily alcohol consumption, memory loss, B12 deficiency and obesity, who reports snoring and sleep disruption.  His wife has noticed pauses in his breathing while he is asleep.  He has a family history of  sleep apnea, both parents had obstructive sleep apnea, his 40 year old son also has a CPAP machine.  He works full-time in [employer].  He has 2 grown sons.  Bedtime is generally between 9:30 PM and 10 PM and rise time between 7:30 and 8.  He has nocturia about twice per average night.  Denies recurrent nocturnal or morning headaches.  He lives with his wife.  They have currently no pets in the household.  He drinks caffeine in the form of coffee, about 2 cups/day, he drinks alcohol daily in the form of beer and liquor, about 2 servings  each.  He smokes about half a pack per day.  He recently started Ozempic.  They do have a TV in the bedroom and it tends to be on at night but they do not keep it on all night.  I reviewed your office note from 12/01/2022.   His Past Medical History Is Significant For: Past Medical History:  Diagnosis Date   Diabetes mellitus without complication (HCC)    Essential hypertension 09/14/2006   Qualifier: Diagnosis of   By: Tawanna Cooler, RN, Ellen       Hyperlipidemia    Hypertension    Need for immunization against influenza 10/11/2017   Polycythemia    Routine general medical examination at a health care facility 10/11/2017   Sleep stage dysfunction 10/17/2016    His Past Surgical History Is Significant For: History reviewed. No pertinent surgical history.  His Family History Is Significant For: Family History  Problem Relation Age of Onset   Kidney disease Mother    Hyperlipidemia Mother    Hearing loss Mother    COPD Mother    Hypertension Mother    Diabetes Mother    Asthma Mother    Sleep apnea Mother    Sleep apnea Father    Hyperlipidemia Father    Asthma Father    Hypertension Father    Hypertension Sister    Hyperlipidemia Sister    Diabetes Sister    Hypertension Brother    Hearing loss Brother    COPD Brother    Sleep apnea Son     His Social History Is Significant For: Social History   Socioeconomic History   Marital status: Married    Spouse name: Not on file   Number of children: Not on file   Years of education: Not on file   Highest education level: Not on file  Occupational History   Not on file  Tobacco Use   Smoking status: Every Day    Current packs/day: 0.50    Types: Cigarettes   Smokeless tobacco: Never  Vaping Use   Vaping status: Never Used  Substance and Sexual Activity   Alcohol use: Yes    Alcohol/week: 12.0 standard drinks of alcohol    Types: 8 Cans of beer, 4 Shots of liquor per week   Drug use: No   Sexual activity: Yes  Other  Topics Concern   Not on file  Social History Narrative   Caffeine: 2 cups/day in the morning   Social Drivers of Health   Financial Resource Strain: Not on file  Food Insecurity: No Food Insecurity (03/14/2023)   Hunger Vital Sign    Worried About Running Out of Food in the Last Year: Never true    Ran Out of Food in the Last Year: Never true  Transportation Needs: No Transportation Needs (03/14/2023)   PRAPARE - Administrator, Civil Service (Medical): No  Lack of Transportation (Non-Medical): No  Physical Activity: Not on file  Stress: Not on file  Social Connections: Not on file    His Allergies Are:  No Known Allergies:   His Current Medications Are:  Outpatient Encounter Medications as of 05/03/2023  Medication Sig   amLODipine (NORVASC) 10 MG tablet Take 1 tablet (10 mg total) by mouth daily.   aspirin EC 81 MG tablet Take 1 tablet (81 mg total) by mouth daily.   atenolol (TENORMIN) 100 MG tablet Take 1 tablet (100 mg total) by mouth daily.   Blood Glucose Monitoring Suppl DEVI 1 each by Does not apply route in the morning, at noon, and at bedtime. May substitute to any manufacturer covered by patient's insurance.   Cyanocobalamin (B-12) 1000 MCG TBCR Take 1 tablet by mouth daily.   fexofenadine (ALLEGRA ALLERGY) 180 MG tablet Take 180 mg by mouth daily.   losartan (COZAAR) 100 MG tablet Take 1 tablet (100 mg total) by mouth daily.   Omega-3 Fatty Acids (FISH OIL) 1200 MG CAPS Take by mouth.   rosuvastatin (CRESTOR) 10 MG tablet Take 1 tablet (10 mg total) by mouth daily.   Semaglutide,0.25 or 0.5MG /DOS, (OZEMPIC, 0.25 OR 0.5 MG/DOSE,) 2 MG/3ML SOPN Inject 0.25 mg into the skin once a week.   Semaglutide,0.25 or 0.5MG /DOS, (OZEMPIC, 0.25 OR 0.5 MG/DOSE,) 2 MG/3ML SOPN Inject 0.25 mg into the skin once a week.   spironolactone (ALDACTONE) 50 MG tablet Take 1 tablet (50 mg total) by mouth daily.   doxycycline (VIBRA-TABS) 100 MG tablet Take 1 tablet (100 mg total)  by mouth 2 (two) times daily. (Patient not taking: Reported on 03/14/2023)   Lancets (ONETOUCH ULTRASOFT) lancets Use once daily. Dx E11.9 (Patient not taking: Reported on 05/03/2023)   No facility-administered encounter medications on file as of 05/03/2023.  :  Review of Systems:  Out of a complete 14 point review of systems, all are reviewed and negative with the exception of these symptoms as listed below:   Review of Systems  Neurological:        Pt is here with his Wife. Pt states that everything is going good with his CPAP Machine. Pt denies any complaints about his CPAP.  Pt's wife stated that pt's memory started declining a couple of years. Pt states that he can have a conversation and will forget what he was talking about, but will recall it later. Pt's wife states that pt has trouble with comprehension. Pt's wife states that pt has a hard time with numbers and remembering numbers.     Objective:  Neurological Exam  Physical Exam Physical Examination:   Vitals:   05/03/23 0824  BP: 98/69  Pulse: 84    General Examination: The patient is a very pleasant 63 y.o. male in no acute distress. He appears well-developed and well-nourished and well groomed.   HEENT: Normocephalic, atraumatic, pupils are equal, round and reactive to light, extraocular tracking is good without limitation to gaze excursion or nystagmus noted.  Corrective eyeglasses in place.  Hearing is grossly intact. Face is symmetric with normal facial animation. Speech is clear with no dysarthria noted. There is no hypophonia. There is no lip, neck/head, jaw or voice tremor. Neck is supple with full range of passive and active motion. There are no carotid bruits on auscultation. Oropharynx exam reveals: Moderate mouth dryness, adequate dental hygiene and moderate airway crowding.  Tongue protrudes centrally and palate elevates symmetrically.   Chest: Clear without crackles.  Heart: S1+S2+0, regular and normal  without murmurs, rubs or gallops noted.    Abdomen: Soft, non-tender and non-distended.   Extremities: There is no obvious edema in the distal lower extremities bilaterally.    Skin: Warm and dry without trophic changes noted.    Musculoskeletal: exam reveals no obvious joint deformities.    Neurologically:  Mental status: The patient is awake, pays attention but provides limited information.  History is supplemented by his wife.  He answers in short sentences.  Speech is clear with normal prosody and enunciation. Thought process is linear. Mood is normal and affect is normal.  Cranial nerves II - XII are as described above under HEENT exam.  Motor exam: Normal bulk, strength and tone is noted. There is no obvious action or resting tremor.  No drift or rebound. Fine motor skills and coordination: grossly intact.  Cerebellar testing: No dysmetria or intention tremor. There is no truncal or gait ataxia.  Normal finger-to-nose, normal heel-to-shin bilaterally. Reflexes 1+ in the upper extremities, trace in the lower extremities. Romberg is negative. Sensory exam: intact to light touch in the upper and lower extremities.  Gait, station and balance: He stands easily. No veering to one side is noted. No leaning to one side is noted. Posture is age-appropriate and stance is narrow based. Gait shows normal stride length and normal pace. No problems turning are noted.  He has difficulty with tandem walk and stops after 3 steps.   Assessment and Plan:  In summary, Mackson Botz is a 63 year old male with an underlying medical history of severe obstructive sleep apnea, hypertension, anemia, polycythemia, diabetes, vitamin B-12 deficiency, obesity, and polycythemia, who presents for evaluation of his memory dysfunction of approximately 2 years duration.  History and examination as well as lack of family history of dementia and several vascular risk factors are discussed.  He likely has multifactorial  cognitive challenges, and we discussed this at length today.  This was an extended visit of over 40 minutes with high complexity addressing multiple issues.   Below is a summary of my discussion points from today's visit and my recommendations with the patient.  He was given these instructions verbally and also in MyChart in his after visit summary which he can access electronically as indicated.    << Please continue using your autoPAP regularly. While your insurance requires that you use PAP at least 4 hours each night on 70% of the nights, I recommend, that you not skip any nights and use it throughout the night if you can. Getting used to PAP and staying with the treatment long term does take time and patience and discipline. Untreated obstructive sleep apnea when it is moderate to severe can have an adverse impact on cardiovascular health and raise her risk for heart disease, arrhythmias, hypertension, congestive heart failure, stroke and diabetes. Untreated obstructive sleep apnea causes sleep disruption, nonrestorative sleep, and sleep deprivation. This can have an impact on your day to day functioning and cause daytime sleepiness and impairment of cognitive function, memory loss, mood disturbance, and problems focussing. Using PAP regularly can improve these symptoms. Please follow-up with your primary care regarding your vitamin B12 deficiency.  You may benefit from B12 injections.   We will request a formal cognitive test called neuropsychological evaluation which is done by a licensed neuropsychologist. We will make a referral in that regard.  I think in your case your memory dysfunction is due to multiple factors, I would like for you to focus on  lifestyle modification.  Please increase your water intake to about 4 bottles of water per day, 16.9 ounce size each.  Reduce and eliminate alcohol consumption altogether.  Alcohol is a known sleep disrupter and affects balance and also cognitive function  adversely.  Please stop smoking. As discussed, we will do an overnight oxygen level test, called ONO, and your DME company will call and set this up for one night, while you also use your autoPAP as usual. We will call you with the results. This is to make sure that your oxygen levels stay in the 90s, while you are using your AutoPap at night.  During the home sleep test in January 2025 your oxygen level dropped to as low as 73%.>>  He is commended for his treatment adherence with his AutoPap machine. We will plan to follow-up in about a year.  Thank you very much for allowing me to participate in the care of this nice patient. If I can be of any further assistance to you please do not hesitate to call me at [phone].   Sincerely,     Huston Foley, MD, PhD

## 2023-05-03 NOTE — Patient Instructions (Addendum)
 It was nice to see you today.  You have complaints of memory loss: memory loss or changes in cognitive function can have many reasons and does not always mean you have dementia.  There are several conditions and situations that can contribute to subjective or objective memory loss.  These factors include: depression, stress, sleep deprivation or poor sleep from insomnia or sleep apnea, dehydration, fluctuation in blood sugar values, thyroid or electrolyte dysfunction, medication effects from sedating medications or narcotic pain medication for example and certain vitamin deficiencies such as vitamin B12 deficiency, and anemia. Dementia can be caused by stroke, brain atherosclerosis or brain vascular disease due to vascular risk factors (smoking, high blood pressure, high cholesterol, obesity and uncontrolled diabetes), certain degenerative brain disorders (including Parkinson's disease and Multiple sclerosis) and by Alzheimer's disease or other, more rare and sometimes hereditary causes.   Here is what I would recommend:   Please continue using your autoPAP regularly. While your insurance requires that you use PAP at least 4 hours each night on 70% of the nights, I recommend, that you not skip any nights and use it throughout the night if you can. Getting used to PAP and staying with the treatment long term does take time and patience and discipline. Untreated obstructive sleep apnea when it is moderate to severe can have an adverse impact on cardiovascular health and raise her risk for heart disease, arrhythmias, hypertension, congestive heart failure, stroke and diabetes. Untreated obstructive sleep apnea causes sleep disruption, nonrestorative sleep, and sleep deprivation. This can have an impact on your day to day functioning and cause daytime sleepiness and impairment of cognitive function, memory loss, mood disturbance, and problems focussing. Using PAP regularly can improve these symptoms. Please  follow-up with your primary care regarding your vitamin B12 deficiency.  You may benefit from B12 injections.   We will request a formal cognitive test called neuropsychological evaluation which is done by a licensed neuropsychologist. We will make a referral in that regard.  I think in your case your memory dysfunction is due to multiple factors, I would like for you to focus on lifestyle modification.  Please increase your water intake to about 4 bottles of water per day, 16.9 ounce size each.  Reduce and eliminate alcohol consumption altogether.  Alcohol is a known sleep disrupter and affects balance and also cognitive function adversely.  Please stop smoking. As discussed, we will do an overnight oxygen level test, called ONO, and your DME company will call and set this up for one night, while you also use your autoPAP as usual. We will call you with the results. This is to make sure that your oxygen levels stay in the 90s, while you are using your AutoPap at night.  During the home sleep test in January 2025 your oxygen level dropped to as low as 73%. We will plan to follow-up in about a year, sooner if needed.

## 2023-05-04 NOTE — Progress Notes (Signed)
 Zott, Hennie Duos, RN; Elsie [place], [place] Got It Thank You     Previous Messages    ----- Message ----- From: Guy Begin, RN Sent: 05/04/2023   7:42 AM EDT To: Melvern Sample; Stacy Zott Subject: ONO x 1 night while on autopap                Good Morning,  Ralph Hurst, 63 y.o., 25-Sep-1960 MRN: [id number]   Order for ONO x1 night while pt on autopap at home, fax result to Dr. Frances Furbish at [phone].  Thank you  Andrey Campanile

## 2023-05-09 DIAGNOSIS — G4733 Obstructive sleep apnea (adult) (pediatric): Secondary | ICD-10-CM | POA: Diagnosis not present

## 2023-05-12 ENCOUNTER — Telehealth: Payer: Self-pay

## 2023-05-15 NOTE — Progress Notes (Signed)
 Complex Care Management Care Guide Note  05/15/2023 Name: Ralph Hurst MRN: [id number] DOB: 29-Jan-1961  Ralph Hurst is a 63 y.o. year old male who is a primary care patient of Lula Olszewski, MD and is actively engaged with the care management team. I reached out to [employer] by phone today to assist with re-scheduling  with the RN Case Manager.  Follow up plan: Unsuccessful telephone outreach attempt made. A HIPAA compliant phone message was left for the patient providing contact information and requesting a return call.  [facility]  [facility], [facility] Care Management Assistant Direct Dial: [phone]  Fax: [phone]

## 2023-05-16 ENCOUNTER — Ambulatory Visit: Payer: Self-pay | Admitting: *Deleted

## 2023-05-19 ENCOUNTER — Ambulatory Visit: Admitting: Internal Medicine

## 2023-05-19 ENCOUNTER — Encounter: Payer: Self-pay | Admitting: Internal Medicine

## 2023-05-19 VITALS — BP 108/60 | HR 78 | Temp 98.0°F | Ht 70.0 in | Wt 224.6 lb

## 2023-05-19 DIAGNOSIS — G320 Subacute combined degeneration of spinal cord in diseases classified elsewhere: Secondary | ICD-10-CM

## 2023-05-19 DIAGNOSIS — E538 Deficiency of other specified B group vitamins: Secondary | ICD-10-CM | POA: Diagnosis not present

## 2023-05-19 DIAGNOSIS — N183 Chronic kidney disease, stage 3 unspecified: Secondary | ICD-10-CM | POA: Diagnosis not present

## 2023-05-19 DIAGNOSIS — Z841 Family history of disorders of kidney and ureter: Secondary | ICD-10-CM

## 2023-05-19 DIAGNOSIS — E1122 Type 2 diabetes mellitus with diabetic chronic kidney disease: Secondary | ICD-10-CM

## 2023-05-19 DIAGNOSIS — E1159 Type 2 diabetes mellitus with other circulatory complications: Secondary | ICD-10-CM

## 2023-05-19 DIAGNOSIS — Z6832 Body mass index (BMI) 32.0-32.9, adult: Secondary | ICD-10-CM

## 2023-05-19 DIAGNOSIS — R413 Other amnesia: Secondary | ICD-10-CM

## 2023-05-19 DIAGNOSIS — E785 Hyperlipidemia, unspecified: Secondary | ICD-10-CM | POA: Diagnosis not present

## 2023-05-19 DIAGNOSIS — E559 Vitamin D deficiency, unspecified: Secondary | ICD-10-CM | POA: Diagnosis not present

## 2023-05-19 DIAGNOSIS — I152 Hypertension secondary to endocrine disorders: Secondary | ICD-10-CM

## 2023-05-19 DIAGNOSIS — E669 Obesity, unspecified: Secondary | ICD-10-CM | POA: Diagnosis not present

## 2023-05-19 DIAGNOSIS — G4733 Obstructive sleep apnea (adult) (pediatric): Secondary | ICD-10-CM

## 2023-05-19 LAB — CBC WITH DIFFERENTIAL/PLATELET
Basophils Absolute: 0.1 10*3/uL (ref 0.0–0.1)
Basophils Relative: 0.8 % (ref 0.0–3.0)
Eosinophils Absolute: 0.3 10*3/uL (ref 0.0–0.7)
Eosinophils Relative: 3.4 % (ref 0.0–5.0)
HCT: 45.9 % (ref 39.0–52.0)
Hemoglobin: 15.9 g/dL (ref 13.0–17.0)
Lymphocytes Relative: 18.9 % (ref 12.0–46.0)
Lymphs Abs: 1.4 10*3/uL (ref 0.7–4.0)
MCHC: 34.7 g/dL (ref 30.0–36.0)
MCV: 97.8 fl (ref 78.0–100.0)
Monocytes Absolute: 0.5 10*3/uL (ref 0.1–1.0)
Monocytes Relative: 6.4 % (ref 3.0–12.0)
Neutro Abs: 5.3 10*3/uL (ref 1.4–7.7)
Neutrophils Relative %: 70.5 % (ref 43.0–77.0)
Platelets: 221 10*3/uL (ref 150.0–400.0)
RBC: 4.69 Mil/uL (ref 4.22–5.81)
RDW: 11.8 % (ref 11.5–15.5)
WBC: 7.5 10*3/uL (ref 4.0–10.5)

## 2023-05-19 LAB — MICROALBUMIN / CREATININE URINE RATIO
Creatinine,U: 146 mg/dL
Microalb Creat Ratio: UNDETERMINED mg/g (ref 0.0–30.0)
Microalb, Ur: <0.7 mg/dL

## 2023-05-19 LAB — LIPID PANEL
Cholesterol: 91 mg/dL (ref 0–200)
HDL: 25.6 mg/dL — ABNORMAL LOW (ref >39.00)
LDL Cholesterol: 11 mg/dL (ref 0–99)
NonHDL: 65.55
Total CHOL/HDL Ratio: 4
Triglycerides: 275 mg/dL — ABNORMAL HIGH (ref 0.0–149.0)
VLDL: 55 mg/dL — ABNORMAL HIGH (ref 0.0–40.0)

## 2023-05-19 LAB — COMPREHENSIVE METABOLIC PANEL WITH GFR
ALT: 25 U/L (ref 0–53)
AST: 18 U/L (ref 0–37)
Albumin: 4.5 g/dL (ref 3.5–5.2)
Alkaline Phosphatase: 55 U/L (ref 39–117)
BUN: 20 mg/dL (ref 6–23)
CO2: 28 meq/L (ref 19–32)
Calcium: 9.6 mg/dL (ref 8.4–10.5)
Chloride: 101 meq/L (ref 96–112)
Creatinine, Ser: 1.37 mg/dL (ref 0.40–1.50)
GFR: 54.9 mL/min — ABNORMAL LOW (ref >60.00)
Glucose, Bld: 176 mg/dL — ABNORMAL HIGH (ref 70–99)
Potassium: 4.7 meq/L (ref 3.5–5.1)
Sodium: 137 meq/L (ref 135–145)
Total Bilirubin: 0.5 mg/dL (ref 0.2–1.2)
Total Protein: 6.6 g/dL (ref 6.0–8.3)

## 2023-05-19 LAB — B12 AND FOLATE PANEL
Folate: 14 ng/mL (ref >5.9)
Vitamin B-12: >1537 pg/mL — ABNORMAL HIGH (ref 211–911)

## 2023-05-19 LAB — VITAMIN D 25 HYDROXY (VIT D DEFICIENCY, FRACTURES): VITD: 23.09 ng/mL — ABNORMAL LOW (ref 30.00–100.00)

## 2023-05-19 LAB — HEMOGLOBIN A1C: Hgb A1c MFr Bld: 6.8 % — ABNORMAL HIGH (ref 4.6–6.5)

## 2023-05-19 MED ORDER — CYANOCOBALAMIN 1000 MCG/ML IJ SOLN: INTRAMUSCULAR | 1 refills | Status: DC

## 2023-05-19 MED ORDER — B-12 1000 MCG SL SUBL: 1.0000 | SUBLINGUAL_TABLET | Freq: Every day | SUBLINGUAL | 3 refills | Status: DC

## 2023-05-19 MED ORDER — OZEMPIC (0.25 OR 0.5 MG/DOSE) 2 MG/3ML ~~LOC~~ SOPN: 0.5000 mg | PEN_INJECTOR | SUBCUTANEOUS | 3 refills | Status: DC

## 2023-05-19 MED ORDER — CYANOCOBALAMIN 1000 MCG/ML IJ SOLN: 1000.0000 ug | Freq: Once | INTRAMUSCULAR | Status: AC

## 2023-05-19 MED ORDER — CYANOCOBALAMIN 1000 MCG/ML IJ SOLN
1000.0000 ug | Freq: Once | INTRAMUSCULAR | Status: AC
  Administered 2023-05-19: 1000 ug via INTRAMUSCULAR

## 2023-05-19 MED ORDER — EMPAGLIFLOZIN 10 MG PO TABS: 10.0000 mg | ORAL_TABLET | Freq: Every day | ORAL | 3 refills | Status: DC

## 2023-05-19 NOTE — Assessment & Plan Note (Addendum)
 Assessment: Diabetes with moderate glycemic control demonstrated by A1c 6.8% (goal <7.0% for his age/comorbidities). Random blood glucose today was 176 mg/dL (elevated), indicating day-to-day management could be improved despite acceptable A1c. Patient reports self-monitored blood glucose of 146 mg/dL after a large breakfast. Previously documented random glucose was significantly elevated at 264 mg/dL (October 6578). Currently managed with semaglutide (Ozempic) 0.5 mg weekly, which patient reports is effective for appetite suppression and has contributed to weight loss (233 lbs to current 224 lbs over approximately 3 months). Stage 3 CKD is stable with current GFR 54.90 mL/min (similar to previous 53.21 mL/min in October 2024). Microalbuminuria stable and improved (<0.7, with prior level of 1.0 in April 2024). Diabetes management is complicated by neurological issues affecting self-care abilities and need to avoid metformin dose escalation due to CKD. Plan: Add empagliflozin (Jardiance) 10 mg daily to regimen for:  Enhanced glycemic control Renoprotective effects in setting of CKD Potential cardiovascular benefit given vascular risk factors Continue semaglutide (Ozempic) 0.5 mg weekly  Discussed potential for dose increase to enhance weight loss and improve glycemic control Decision made to maintain current dose given good tolerability and effectiveness Will reassess need for dose escalation at follow-up based on weight trend and glycemic control Monitoring:  Home blood glucose monitoring once weekly (preferably fasting) A1c in 3 months Renal function in 3 months Microalbumin/creatinine ratio in 3 months Diabetes management:  Due for diabetic eye exam - referral placed to ophthalmology today (never had one documented) Annual comprehensive foot exam performed last in April 2024, not due until April 2025 Diabetes education reinforced regarding diet, monitoring, and medication adherence Patient  education provided on:  SGLT-2 inhibitor side effects and precautions Signs/symptoms of hypoglycemia with new medication regimen Importance of hydration while on SGLT-2 inhibitor Sick day management guidelines

## 2023-05-19 NOTE — Assessment & Plan Note (Addendum)
 Assessment: Obesity with BMI 32.23, though showing positive response to current interventions. Weight decreased from 233 lbs in January 2025 to current 224 lbs (approximately 9 lb weight loss in 3 months), likely attributable to semaglutide therapy and dietary changes. Patient reports reduced appetite and food intake while on semaglutide 0.5 mg weekly without significant side effects. Plan: Continue semaglutide 0.5 mg weekly for weight management  Consider dose adjustment in future if weight loss plateaus Maintain current dose given favorable response and tolerability Lifestyle modification counseling:  Reinforce portion control and reduced caloric intake Encourage regular physical activity as tolerated given neurological symptoms Emphasize dietary choices that support both weight loss and diabetes management Set target weight loss goal of 5-10% of body weight over 6 months Monitor weight at each visit Assess for medication side effects at follow-up visits

## 2023-05-19 NOTE — Assessment & Plan Note (Addendum)
 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 Neurologist noted abnormal neurological findings including inability to perform tandem gait (heel-to-toe walking). Patient has upcoming appointment with neuropsychologist for formal cognitive evaluation. Current memory symptoms impacting daily function, though patient remains functional enough to provide reliable history. Plan: Address modifiable factors:  Continue B12 replacement therapy as detailed above Expedite treatment for severe OSA (see sleep apnea plan below) Recommend alcohol reduction or cessation Optimize vascular risk factor management (diabetes, lipids, BP) Support upcoming neuropsychological testing:  Patient to keep scheduled appointment Results to be reviewed at follow-up visit Provide patient and family education regarding:  Safety considerations with memory impairment Importance of medication adherence with potential for using medication organizers Role of family support in monitoring symptoms Follow up on cognitive function at each visit with basic screening Consider formal cognitive assessment tools at next visit to establish baseline if neuropsychological testing results not yet available

## 2023-05-19 NOTE — Progress Notes (Signed)
 ==============================  [facility]: [phone]   -- Medical Office Visit --  Patient: Ralph Hurst      Age: 63 y.o.       Sex:  male  Date:   05/19/2023 Today's Healthcare Provider: Anthon Kins, MD  ==============================   Chief Complaint: Insomnia (Pt is present requesting CPAP has neurologist March 26th. Pt has done did sleep study.) and eye (Pt would like referral to eye dr)  History of Present Illness 63 year old male with diabetes who presents with difficulty walking and insomnia. He was referred by a neurologist for further evaluation of his walking difficulties.  He experiences difficulty walking, characterized by an inability to walk in a straight line and perform heel-to-toe walking. He feels 'tipsy' and tends to fall back if standing for a while. This issue was identified during a neurologist visit on May 03, 2023, where he was referred to a neuropsychologist for further testing. A possible B12 deficiency was suggested.  He is currently on Ozempic 0.5 mg for diabetes management, which has been effective in reducing his food intake, aiding in weight loss, and improving his blood sugar levels. No side effects have been experienced at this dosage. His last recorded blood sugar was 146 mg/dL after a large breakfast.  He has a history of insomnia, which is being managed by neurology.  They have asked that we manage B12 injections of difficulty walking heel-toe despite being on tablets.  His social history includes regular alcohol consumption, specifically two beers a day, which may contribute to his B12 deficiency and memory issues. He also smokes. He has a supportive family environment, with a grandchild and another on the way.  His kidney function has been low on last 2 checks and he didn't know it.   Has mild diabetes mellitus but never took much ibuprofen. Has family history kidney disease.  Lab Results   Component Value Date/Time   GFR 54.90 (L) 05/19/2023 09:56 AM   GFR 53.21 (L) 12/01/2022 09:03 AM   GFR 61.71 05/25/2022 08:30 AM   GFR 87.64 10/11/2017 10:38 AM   GFR 97.45 10/11/2016 08:20 AM   Background: Reviewed: He has Hyperlipidemia; Tobacco use disorder; Type 2 diabetes with stage 3 chronic kidney disease GFR 30-59 (HCC); Hypertension associated with diabetes (HCC); OSA (obstructive sleep apnea); Memory loss; Subacute combined degeneration (HCC); Macrocytic anemia; Obesity due to energy imbalance; Tick bite of abdomen; Allergic rhinitis; Cerebral ischemia; and Persistent cognitive impairment, vascular and nutritional, disabling on their problem list.  Reviewed: He   has a past medical history of Diabetes mellitus without complication (HCC), Essential hypertension (09/14/2006), Hyperlipidemia, Hypertension, Need for immunization against influenza (10/11/2017), Polycythemia, Polycythemia, secondary (09/14/2006), Routine general medical examination at a health care facility (10/11/2017), and Sleep stage dysfunction (10/17/2016).  Manually updated:  Problem  Memory Loss   Memory Loss with Multiple Contributing Factors (R41.3, G32.0) #ProgressiveDisorder #DisabilityConcern  STATUS: 05/20/2023: Progressive memory decline with multiple contributing factors, significant functional impairment affecting work performance, and abnormal neurological findings including difficulty with heel-to-toe walking. MRI (01/2023) showed moderate chronic small vessel ischemic disease in periventricular white matter and pons. Fails simple cognitive tests (0/3 word recall, unable to do serial 7s).  CLINICAL CONTEXT: 63 year old male with type 2 diabetes with CKD stage 3, B12 deficiency (210 pg/mL), severe OSA (STOP-Bang 6), macrocytic anemia despite coexisting polycythemia, ongoing tobacco use, and alcohol consumption affecting B12 absorption. Memory problems reported since approximately 2022 with significant  worsening in past  year.  DIAGNOSTIC ASSESSMENT: ? Multi-factorial etiology with several confirmed contributors: ? Subacute combined degeneration (B12 deficiency with neurological manifestations) ? Vascular cognitive impairment (moderate small vessel ischemic disease on MRI) ? Untreated severe OSA (cognitive effects of chronic hypoxemia) ? Possible additional nutritional deficiencies associated with alcohol use ? Metabolic encephalopathy from diabetes/CKD (less likely with current control)  MANAGEMENT PLAN: ?? Comprehensive nutritional panel (folate, thiamine, vitamin D, zinc, copper) ?? Homocysteine and methylmalonic acid levels (sensitive B12 function markers) ?? Complete hepatic panel with GGT (assess alcohol impact) ?? Fasting lipid panel and inflammatory markers (vascular risk) ?? B12 injection protocol: daily  1 week ? weekly  1 month ? monthly maintenance ?? Empiric thiamine supplementation (100mg  daily) ?? Consider ASA 81mg  daily for vascular protection ?? Neurology follow-up (scheduled) ?? Formal neuropsychological testing for disability documentation ?? Confirm BiPAP implementation for severe OSA  MONITORING: ?? Monthly follow-up initially to assess response to interventions ?? Repeat B12, folate, and CBC in 3 months to confirm improvement ?? MoCA or MMSE testing at each follow-up to track cognitive status ?? Document all function-limiting symptoms for disability application ??? Monitor for improvement in gait and balance (heel-to-toe walking)  DISABILITY SUPPORT: Progressive memory loss significantly impacting work functionality. Unable to complete routine mental tasks (0/3 word recall, cannot perform serial 7s). Neurological findings include abnormal gait (inability to perform heel-to-toe walking). Brain MRI confirms organic pathology with moderate small vessel ischemic disease. Multiple medical factors contributing to cognitive dysfunction with only partial reversibility  expected despite optimal treatment.  ADDITIONAL CONSIDERATIONS: Memory dysfunction appears to be progressive despite initial interventions. Multifactorial etiology suggests partial improvement possible with comprehensive management but likely persistent deficits. Disability application support should be prioritized while pursuing aggressive medical management. Occupational therapy for adaptive strategies would be beneficial while awaiting improvement from medical interventions.     Subacute Combined Degeneration (Hcc)   Subacute Combined Degeneration (G32.0) #ActiveTreatment #NutritionalDeficiency  STATUS: 05/20/2023: Diagnosed with B12 deficiency (210 pg/mL) with neurological manifestations including difficulty with heel-to-toe walking and progressive memory loss. Initial oral B12 supplementation ineffective; now on aggressive replacement with injections and sublingual supplementation. Neurologist has confirmed diagnosis. Suspected malabsorption possibly related to alcohol use.  CLINICAL CONTEXT: 63 year old male with macrocytic anemia (MCV 101.4), cerebellar signs (difficulty with heel-to-toe walking), memory impairment, and cognitive dysfunction. Coexisting polycythemia masks the expected anemia of B12 deficiency, resulting in normal hemoglobin despite macrocytosis. Alcohol consumption likely contributing to malabsorption.  DIAGNOSTIC ASSESSMENT: ? Confirmed B12 deficiency (210 pg/mL) with neurologic manifestations ? Macrocytosis (MCV 101.4) without frank anemia due to underlying polycythemia ? Suspected malabsorption based on failure to respond to oral supplementation ? Possible pernicious anemia (pending intrinsic factor antibody results) ? Alcohol-related nutritional deficiency (alcohol inhibits B12 absorption and transport) ? Possible coexisting folate deficiency (common with B12 deficiency and alcohol use)  MANAGEMENT PLAN: ?? Aggressive B12 replacement protocol: 1000 mcg IM daily  1 week ?  weekly  1 month ? monthly ?? Concurrent sublingual B12 (1000 mcg daily) ?? Homocysteine and methylmalonic acid testing (sensitive markers of B12 deficiency) ?? Folate level assessment and supplementation if needed ?? Thiamine level (often deficient with alcohol use) ?? Alcohol cessation counseling ?? Gastroenterology consultation if malabsorption persists ?? High-protein, nutrient-dense diet with limited alcohol  MONITORING: ?? Monthly assessment of neurological symptoms and gait ?? B12 level repeat in 2-3 months to confirm normalization ?? CBC with MCV at 3 months to assess macrocytosis resolution ??? Formal neurologic examination at each visit to document improvement ?? Cognitive testing to track  memory and executive function response  GUIDELINES: Per American Academy of Neurology guidelines, patients with neurologic manifestations of B12 deficiency require aggressive parenteral replacement and may have only partial recovery of neurologic function. Early and intensive treatment improves prognosis. Neurologic deficits may persist despite normalization of B12 levels if treatment is delayed.  ADDITIONAL CONSIDERATIONS: Neurologic manifestations may take 3-6 months to show improvement after B12 replacement begins. Damage may be partially irreversible if deficiency has been prolonged. Recommend screening for additional nutritional deficiencies (folate, thiamine, niacin, vitamin E) that commonly co-occur with B12 deficiency, especially with alcohol use, and may contribute to neurologic symptoms.     Obesity Due to Energy Imbalance   (12/01/2022) BMI 32.60 kg/m. Started Ozempic for weight management and diabetes control. Lifestyle modifications discussed.   Type 2 Diabetes With Stage 3 Chronic Kidney Disease Gfr 30-59 (Hcc)   Type 2 Diabetes with CKD Stage 3 (E11.22, N18.3) #ChronicDisease #OrganDamage  STATUS: 05/20/2023: Diabetes with moderate control (HbA1c 6.8%, recent random glucose 264).  Recent addition of empagliflozin (Jardiance) 10mg  daily to regimen of semaglutide 0.5mg  weekly and metformin. Stage 3 CKD stable but requires medication adjustments and monitoring. Weight loss progressing with semaglutide (233 lbs ? 224 lbs over ~3 months).  CLINICAL CONTEXT: 63 year old male with obesity (BMI 32.23), hypertension (controlled on multi-drug regimen), hyperlipidemia (mixed dyslipidemia), cognitive impairment with possible vascular contribution, and ongoing tobacco use. Microalbuminuria fluctuating but currently <0.7 (improved from prior 1.0). Diabetes control affected by cognitive issues and medication adherence challenges.  DIAGNOSTIC ASSESSMENT: ? Diabetes with moderate control (HbA1c 6.8%, target <7.0%) ? Stage 3 CKD (specific GFR not provided in recent notes, previously documented) ? Fluctuating microalbuminuria (currently improved at <0.7) ? Cumulative end-organ damage risks due to multiple risk factors (tobacco, hypertension history) ? Missing diabetes complications screening (no documented eye exam since 2019)  MANAGEMENT PLAN: ?? Medication optimization: semaglutide 0.5mg  weekly, empagliflozin 10mg  daily, metformin (dose adjusted for CKD) ?? HbA1c monitoring every 3 months until stable, then quarterly ?? Annual kidney function with eGFR, microalbumin/creatinine ratio ?? Regular lipid panel to monitor mixed dyslipidemia ?? Renal ultrasound (ordered for future date) ??? URGENT ophthalmology referral for comprehensive diabetes eye exam (overdue since 2019) ???? Podiatry evaluation for comprehensive foot exam (high risk with neuropathy concerns)  MONITORING: ?? Follow-up in 1 month to assess response to empagliflozin and medication adherence ?? Home blood glucose monitoring 1-2 times weekly (fasting preferred) ?? Blood pressure monitoring with medication adjustment considering CKD status ??? Assess for hypoglycemia symptoms with new medication regimen ?? Track weight changes  with semaglutide therapy  GUIDELINES: Per ADA/KDIGO 2023 guidelines, SGLT-2 inhibitors (empagliflozin) are preferred for diabetic kidney disease with albuminuria regardless of glycemic control targets. Annual screening for diabetes complications is recommended regardless of diabetes control. For patients with CKD stage 3, metformin dose should be reduced and kidney function monitored more frequently.  ADDITIONAL CONSIDERATIONS: Cognitive impairment may impact medication adherence and diabetes self-management. Consider simplified regimen and medication organizers. Family/caregiver involvement recommended for consistent monitoring. Empagliflozin provides dual benefit for both diabetic kidney disease and cardiovascular risk reduction, particularly important given vascular cognitive changes on MRI.  g   Lab Results  Component Value Date   HGBA1C 6.8 (H) 12/01/2022   Lab Results  Component Value Date   MICROALBUR <0.7 12/01/2022   MICROALBUR 1.0 05/25/2022   Lab Results  Component Value Date   LDLCALC 36 05/25/2022   BP Readings from Last 5 Encounters:  12/01/22 112/65  06/01/22 128/78  05/11/22 120/78  10/11/17 Marland Kitchen)  170/90  10/17/16 (!) 150/108   Diabetes Health Maintenance Due  Topic Date Due   OPHTHALMOLOGY EXAM  Never done   FOOT EXAM  06/01/2023   HEMOGLOBIN A1C  06/01/2023   Last Foot Exam: 06/01/2022 Diabetic Foot Exam - Simple   No data filed    Care Team Ophthalmologist:  No care team member to display plans to see Dr. Francetta Innocent 05/28/22    Reviewed:  Allergies as of 05/19/2023   (No Known Allergies)   Medications: Reviewed: Current Outpatient Medications on File Prior to Visit  Medication Sig   amLODipine (NORVASC) 10 MG tablet Take 1 tablet (10 mg total) by mouth daily.   aspirin EC 81 MG tablet Take 1 tablet (81 mg total) by mouth daily.   atenolol (TENORMIN) 100 MG tablet Take 1 tablet (100 mg total) by mouth daily.   Blood Glucose Monitoring Suppl DEVI 1 each by  Does not apply route in the morning, at noon, and at bedtime. May substitute to any manufacturer covered by patient's insurance.   fexofenadine (ALLEGRA ALLERGY) 180 MG tablet Take 180 mg by mouth daily.   Lancets (ONETOUCH ULTRASOFT) lancets Use once daily. Dx E11.9   losartan (COZAAR) 100 MG tablet Take 1 tablet (100 mg total) by mouth daily.   Omega-3 Fatty Acids (FISH OIL) 1200 MG CAPS Take by mouth.   rosuvastatin (CRESTOR) 10 MG tablet Take 1 tablet (10 mg total) by mouth daily.   spironolactone (ALDACTONE) 50 MG tablet Take 1 tablet (50 mg total) by mouth daily.   doxycycline (VIBRA-TABS) 100 MG tablet Take 1 tablet (100 mg total) by mouth 2 (two) times daily. (Patient not taking: Reported on 05/19/2023)   No current facility-administered medications on file prior to visit.   Medications Discontinued During This Encounter  Medication Reason   Semaglutide,0.25 or 0.5MG /DOS, (OZEMPIC, 0.25 OR 0.5 MG/DOSE,) 2 MG/3ML SOPN    Semaglutide,0.25 or 0.5MG /DOS, (OZEMPIC, 0.25 OR 0.5 MG/DOSE,) 2 MG/3ML SOPN    Cyanocobalamin (B-12) 1000 MCG TBCR    cyanocobalamin (VITAMIN B12) 1000 MCG/ML injection       Physical Exam:    05/19/2023    8:57 AM 05/03/2023    8:24 AM 03/08/2023   11:08 AM  Vitals with BMI  Height 5\' 10"  5\' 10"  5\' 10"   Weight 224 lbs 10 oz 226 lbs 233 lbs 3 oz  BMI 32.23 32.43 33.46  Systolic 108 98 116  Diastolic 60 69 74  Pulse 78 84 60   Wt Readings from Last 10 Encounters:  05/19/23 224 lb 9.6 oz (101.9 kg)  05/03/23 226 lb (102.5 kg)  03/08/23 233 lb 3.2 oz (105.8 kg)  02/14/23 237 lb (107.5 kg)  01/19/23 233 lb 3.2 oz (105.8 kg)  01/02/23 232 lb (105.2 kg)  12/01/22 228 lb 6 oz (103.6 kg)  06/01/22 226 lb 6.4 oz (102.7 kg)  05/11/22 224 lb (101.6 kg)  10/11/17 234 lb (106.1 kg)  Vital signs reviewed.  Nursing notes reviewed. Weight trend reviewed. Physical Exam  Physical Exam General Appearance:  No acute distress appreciable.   Well-groomed,  healthy-appearing male.  Well proportioned with no abnormal fat distribution.  Good muscle tone. Pulmonary:  Normal work of breathing at rest, no respiratory distress apparent. SpO2: 98 %  Musculoskeletal: All extremities are intact.  Neurological:  Awake, alert, oriented, and engaged.  No obvious focal neurological deficits or cognitive impairments.  Sensorium seems unclouded.   Speech is clear and coherent with logical content. Psychiatric:  Appropriate  mood, pleasant and cooperative demeanor, thoughtful and engaged during the exam Slightly forgetful, slightly uneven gait, unchanged    Office Visit on 05/19/2023  Component Date Value   WBC 05/19/2023 7.5    RBC 05/19/2023 4.69    Hemoglobin 05/19/2023 15.9    HCT 05/19/2023 45.9    MCV 05/19/2023 97.8    MCHC 05/19/2023 34.7    RDW 05/19/2023 11.8    Platelets 05/19/2023 221.0    Neutrophils Relative % 05/19/2023 70.5    Lymphocytes Relative 05/19/2023 18.9    Monocytes Relative 05/19/2023 6.4    Eosinophils Relative 05/19/2023 3.4    Basophils Relative 05/19/2023 0.8    Neutro Abs 05/19/2023 5.3    Lymphs Abs 05/19/2023 1.4    Monocytes Absolute 05/19/2023 0.5    Eosinophils Absolute 05/19/2023 0.3    Basophils Absolute 05/19/2023 0.1    Sodium 05/19/2023 137    Potassium 05/19/2023 4.7    Chloride 05/19/2023 101    CO2 05/19/2023 28    Glucose, Bld 05/19/2023 176 (H)    BUN 05/19/2023 20    Creatinine, Ser 05/19/2023 1.37    Total Bilirubin 05/19/2023 0.5    Alkaline Phosphatase 05/19/2023 55    AST 05/19/2023 18    ALT 05/19/2023 25    Total Protein 05/19/2023 6.6    Albumin 05/19/2023 4.5    GFR 05/19/2023 54.90 (L)    Calcium 05/19/2023 9.6    Cholesterol 05/19/2023 91    Triglycerides 05/19/2023 275.0 (H)    HDL 05/19/2023 25.60 (L)    VLDL 05/19/2023 55.0 (H)    LDL Cholesterol 05/19/2023 11    Total CHOL/HDL Ratio 05/19/2023 4    NonHDL 05/19/2023 65.55    Hgb A1c MFr Bld 05/19/2023 6.8 (H)    Microalb, Ur  05/19/2023 <0.7    Creatinine,U 05/19/2023 146.0    Microalb Creat Ratio 05/19/2023 Unable to calculate    Vitamin B-12 05/19/2023 >1537 (H)    Folate 05/19/2023 14.0    VITD 05/19/2023 23.09 (L)   Office Visit on 12/01/2022  Component Date Value   Microalb, Ur 12/01/2022 <0.7    Creatinine,U 12/01/2022 111.6    Microalb Creat Ratio 12/01/2022 0.6    Sodium 12/01/2022 136    Potassium 12/01/2022 4.1    Chloride 12/01/2022 100    CO2 12/01/2022 28    Glucose, Bld 12/01/2022 264 (H)    BUN 12/01/2022 18    Creatinine, Ser 12/01/2022 1.41    Total Bilirubin 12/01/2022 0.6    Alkaline Phosphatase 12/01/2022 53    AST 12/01/2022 18    ALT 12/01/2022 21    Total Protein 12/01/2022 6.7    Albumin 12/01/2022 4.2    GFR 12/01/2022 53.21 (L)    Calcium 12/01/2022 9.4    Hgb A1c MFr Bld 12/01/2022 6.8 (H)    WBC 12/01/2022 6.1    RBC 12/01/2022 4.53    Platelets 12/01/2022 197.0    Hemoglobin 12/01/2022 15.3    HCT 12/01/2022 45.9    MCV 12/01/2022 101.4 (H)    MCHC 12/01/2022 33.4    RDW 12/01/2022 12.5    Vitamin B-12 12/01/2022 210 (L)   Office Visit on 06/01/2022  Component Date Value   PSA 06/01/2022 0.69   Lab on 05/25/2022  Component Date Value   Microalb, Ur 05/25/2022 1.0    Creatinine,U 05/25/2022 147.3    Microalb Creat Ratio 05/25/2022 0.7    TSH 05/25/2022 1.67    Hgb A1c MFr Bld 05/25/2022 6.5  WBC 05/25/2022 8.3    RBC 05/25/2022 4.35    Platelets 05/25/2022 206.0    Hemoglobin 05/25/2022 14.9    HCT 05/25/2022 42.1    MCV 05/25/2022 96.9    MCHC 05/25/2022 35.3    RDW 05/25/2022 12.9    Sodium 05/25/2022 139    Potassium 05/25/2022 4.5    Chloride 05/25/2022 104    CO2 05/25/2022 27    Glucose, Bld 05/25/2022 130 (H)    BUN 05/25/2022 15    Creatinine, Ser 05/25/2022 1.25    Total Bilirubin 05/25/2022 0.5    Alkaline Phosphatase 05/25/2022 48    AST 05/25/2022 19    ALT 05/25/2022 26    Total Protein 05/25/2022 6.3    Albumin 05/25/2022 4.2     GFR 05/25/2022 61.71    Calcium 05/25/2022 9.1    Cholesterol 05/25/2022 97    Triglycerides 05/25/2022 151.0 (H)    HDL 05/25/2022 30.80 (L)    VLDL 05/25/2022 30.2    LDL Cholesterol 05/25/2022 36    Total CHOL/HDL Ratio 05/25/2022 3    NonHDL 05/25/2022 66.39   No image results found. Home sleep test Result Date: 02/28/2023 Debbra Fairy, MD     03/06/2023  4:38 PM  [facility] HOME SLEEP TEST (Watch PAT) REPORT STUDY DATE: 02/28/2023 DOB: Dec 09, 1960 MRN: [id number] ORDERING CLINICIAN: Debbra Fairy, MD, PhD  REFERRING CLINICIAN: Anthon Kins, MD CLINICAL INFORMATION/HISTORY: 63 year old male with an underlying medical history of diabetes, hypertension, hyperlipidemia, polycythemia, smoking, daily alcohol consumption, memory loss, B12 deficiency and obesity, who reports snoring, sleep disruption and witnessed apneas.  Epworth sleepiness score: 8/24. BMI: 33.3 kg/m FINDINGS: Sleep Summary: Total Recording Time (hours, min): 9 hours, 35 min Total Sleep Time (hours, min):  8 hours, 30 min Percent REM (%):    Inconclusive REM detection  Respiratory Indices: Calculated pAHI (per hour):  41.2/hour       REM pAHI:    N/A     NREM pAHI: 41.2/hour Central pAHI: 2.0/hour Oxygen Saturation Statistics:  Oxygen Saturation (%) Mean: 89% Minimum oxygen saturation (%):                 73% O2 Saturation Range (%): 73-97%  O2 Saturation (minutes) <=88%: 34.3 min Pulse Rate Statistics: Pulse Mean (bpm):    56/min  Pulse Range (41 - 132/min) IMPRESSION: OSA (obstructive sleep apnea), severe Nocturnal Hypoxemia RECOMMENDATION: This home sleep test demonstrates severe obstructive sleep apnea with a total AHI of 41.2/hour and O2 nadir of 73% with significant time below or at 88% saturation of over 30 minutes for the study, indicating nocturnal hypoxemia. Snoring was detected, in the mild to moderate range, at times louder.  Urgent treatment with positive airway pressure is highly recommended. The patient  will be advised to proceed with an autoPAP titration/trial at home. A laboratory attended titration study can be considered in the future for optimization of treatment settings and to improve tolerance and compliance, if needed, down the road. Alternative treatment options are limited secondary to the severity of the patient's sleep disordered breathing, but may include surgical treatment with an implantable hypoglossal nerve stimulator (in carefully selected candidates, meeting criteria).  Concomitant weight loss is recommended (where clinically appropriate). Please note, that untreated obstructive sleep apnea may carry additional perioperative morbidity. Patients with significant obstructive sleep apnea should receive perioperative PAP therapy and the surgeons and particularly the anesthesiologist should be informed of the diagnosis and the severity of the sleep disordered breathing. The patient should  be cautioned not to drive, work at heights, or operate dangerous or heavy equipment when tired or sleepy. Review and reiteration of good sleep hygiene measures should be pursued with any patient. Other causes of the patient's symptoms, including circadian rhythm disturbances, an underlying mood disorder, medication effect and/or an underlying medical problem cannot be ruled out based on this test. Clinical correlation is recommended. The patient and his referring provider will be notified of the test results. The patient will be seen in follow up in sleep clinic at [facility]. I certify that I have reviewed the raw data recording prior to the issuance of this report in accordance with the standards of the American Academy of Sleep Medicine (AASM). INTERPRETING PHYSICIAN: Debbra Fairy, MD, PhD Medical Director, [facility]) Diplomat, ABPN (Neurology and Sleep) [facility] [address], [address] [place], [place] [address] [phone]      Results LABS A1c: 6.8  (11/2022) Blood glucose: 146 (05/2023)  DIAGNOSTIC Neurologist evaluation: Unable to perform tandem gait (05/03/2023)     Assessment & Plan Subacute combined degeneration (HCC) Assessment: Confirmed diagnosis based on neurological symptoms including difficulty with heel-to-toe walking (tandem gait), memory impairment, and documented low B12 level (210 pg/mL, October 2024). Despite oral B12 supplementation, patient failed to show improvement, suggesting malabsorption. Current B12 level shows significant improvement (>1537 pg/mL) following initiation of injectable therapy. Associated macrocytosis (MCV 101.4) noted on prior labs has improved (current MCV 97.8) but not fully normalized. Patient has multiple risk factors including regular alcohol consumption (reports 2 beers daily), which likely contributes to malabsorption of B12 and potentially exacerbates neurological symptoms. As noted by neurologist on May 03, 2023, neurological manifestations continue despite normalization of B12 levels, suggesting possible permanent damage. Folate level is now normal at 14.0. Plan: Administer B12 1000 mcg injection intramuscularly today in office Transition to maintenance protocol:  Continue B12 injection therapy: 1000 mcg weekly for 4 more weeks, then monthly thereafter Add sublingual B12 1000 mcg daily for enhanced absorption between injections Monitor neurological symptoms, particularly:  Gait stability and ability to perform heel-to-toe walking Memory function and cognition Balance and coordination Alcohol counseling provided - advised to reduce or eliminate alcohol consumption to improve B12 absorption and prevent further neurological damage Labs monitoring:  Check B12 levels in 3 months to ensure adequate maintenance Monitor CBC with particular attention to MCV to ensure resolution of macrocytosis Patient education provided regarding:  Limited potential for neurologic recovery despite B12  normalization Importance of continued injections for maintenance therapy Relationship between alcohol and B12 malabsorption Continue neurological follow-up as scheduled  B12 deficiency Assessment: Severe B12 deficiency previously documented (210 pg/mL in October 2024) now corrected (current level >1537 pg/mL) with injectable therapy. Clinically manifested with neurological symptoms (difficulty with tandem gait, memory impairment) indicative of subacute combined degeneration. Patient has multiple risk factors for malabsorption including regular alcohol intake (2 beers daily). Folate level now normal at 14.0. Initial oral therapy was ineffective, suggesting possible pernicious anemia or severe malabsorption, pending intrinsic factor antibody testing ordered today. Plan: Continue maintenance B12 therapy per protocol outlined above Pending intrinsic factor antibody test to evaluate for pernicious anemia Ensure lifelong maintenance therapy given severity of neurological manifestations Counsel on optimal diet rich in B12 sources (meat, eggs, dairy) while emphasizing alcohol reduction Monitor for clinical improvement in neurological symptoms, though complete recovery may not occur due to chronicity of deficiency prior to treatment Follow up on intrinsic factor antibody results to determine need for any additional  gastrointestinal evaluation Type 2 diabetes mellitus with stage 3 chronic kidney disease, without long-term current use of insulin, unspecified whether stage 3a or 3b CKD (HCC) Assessment: Diabetes with moderate glycemic control demonstrated by A1c 6.8% (goal <7.0% for his age/comorbidities). Random blood glucose today was 176 mg/dL (elevated), indicating day-to-day management could be improved despite acceptable A1c. Patient reports self-monitored blood glucose of 146 mg/dL after a large breakfast. Previously documented random glucose was significantly elevated at 264 mg/dL (October 1610).  Currently managed with semaglutide (Ozempic) 0.5 mg weekly, which patient reports is effective for appetite suppression and has contributed to weight loss (233 lbs to current 224 lbs over approximately 3 months). Stage 3 CKD is stable with current GFR 54.90 mL/min (similar to previous 53.21 mL/min in October 2024). Microalbuminuria stable and improved (<0.7, with prior level of 1.0 in April 2024). Diabetes management is complicated by neurological issues affecting self-care abilities and need to avoid metformin dose escalation due to CKD. Plan: Add empagliflozin (Jardiance) 10 mg daily to regimen for:  Enhanced glycemic control Renoprotective effects in setting of CKD Potential cardiovascular benefit given vascular risk factors Continue semaglutide (Ozempic) 0.5 mg weekly  Discussed potential for dose increase to enhance weight loss and improve glycemic control Decision made to maintain current dose given good tolerability and effectiveness Will reassess need for dose escalation at follow-up based on weight trend and glycemic control Monitoring:  Home blood glucose monitoring once weekly (preferably fasting) A1c in 3 months Renal function in 3 months Microalbumin/creatinine ratio in 3 months Diabetes management:  Due for diabetic eye exam - referral placed to ophthalmology today (never had one documented) Annual comprehensive foot exam performed last in April 2024, not due until April 2025 Diabetes education reinforced regarding diet, monitoring, and medication adherence Patient education provided on:  SGLT-2 inhibitor side effects and precautions Signs/symptoms of hypoglycemia with new medication regimen Importance of hydration while on SGLT-2 inhibitor Sick day management guidelines Chronic kidney disease (CKD), active medical management without dialysis, stage 3 (moderate) (HCC) Assessment: Stable stage 3 CKD with current GFR 54.90 mL/min (previous 53.21 mL/min in October 2024), likely  secondary to diabetic nephropathy with hypertension as contributing factor. Microalbuminuria stable and improved (<0.7, improved from prior 1.0 in April 2024). Currently managed with losartan 100 mg daily for renoprotection and BP control. Blood pressure well-controlled at 108/60 mmHg on current antihypertensive regimen (amlodipine, atenolol, losartan, spironolactone). Identified newly reported family history of kidney disease (maternal) which may contribute to risk. Patient unaware of kidney function issues prior to today's visit despite documentation on previous encounters. Plan: Add empagliflozin 10 mg daily for renoprotection in setting of diabetic kidney disease Continue current antihypertensive regimen:  Losartan 100 mg daily Amlodipine 10 mg daily Atenolol 100 mg daily Spironolactone 50 mg daily Order renal ultrasound to evaluate for structural abnormalities Patient education provided:  Avoid NSAIDs (ibuprofen, naproxen) due to potential for further kidney damage Importance of BP control for kidney protection Recommended hydration guidelines Explanation of CKD staging and importance of monitoring Monitoring:  Comprehensive metabolic panel with GFR in 3 months Microalbumin/creatinine ratio in 3 months BP at each visit Document family history of kidney disease for future reference Consider nephrology referral if GFR worsens or urine protein increases FH: kidney disease Death- mother.  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 Neurologist noted abnormal neurological findings including  inability to perform tandem gait (heel-to-toe walking). Patient has upcoming appointment with neuropsychologist for formal cognitive evaluation. Current memory symptoms impacting daily function, though patient remains functional enough to provide reliable history. Plan: Address modifiable factors:  Continue B12 replacement therapy as detailed above Expedite treatment for severe OSA (see sleep apnea plan below) Recommend alcohol reduction or cessation Optimize vascular risk factor management (diabetes, lipids, BP) Support upcoming neuropsychological testing:  Patient to keep scheduled appointment Results to be reviewed at follow-up visit Provide patient and family education regarding:  Safety considerations with memory impairment Importance of medication adherence with potential for using medication organizers Role of family support in monitoring symptoms Follow up on cognitive function at each visit with basic screening Consider formal cognitive assessment tools at next visit to establish baseline if neuropsychological testing results not yet available Obesity due to energy imbalance Assessment: Obesity with BMI 32.23, though showing positive response to current interventions. Weight decreased from 233 lbs in January 2025 to current 224 lbs (approximately 9 lb weight loss in 3 months), likely attributable to semaglutide therapy and dietary changes. Patient reports reduced appetite and food intake while on semaglutide 0.5 mg weekly without significant side effects. Plan: Continue semaglutide 0.5 mg weekly for weight management  Consider dose adjustment in future if weight loss plateaus Maintain current dose given favorable response and tolerability Lifestyle modification counseling:  Reinforce portion control and reduced caloric intake Encourage regular physical activity as tolerated given neurological symptoms Emphasize dietary choices that support both weight loss and diabetes  management Set target weight loss goal of 5-10% of body weight over 6 months Monitor weight at each visit Assess for medication side effects at follow-up visits OSA (obstructive sleep apnea) Assessment: Severe obstructive sleep apnea confirmed on home sleep test (January 2025) with AHI of 41.2/hour, O2 nadir of 73%, and significant hypoxemia (>30 minutes below 88% saturation). Risk factors include obesity (BMI 32.23) and alcohol consumption. Currently untreated. Sleep specialist strongly recommended urgent treatment with positive airway pressure therapy. OSA likely contributing to cognitive issues, fatigue, and possibly exacerbating vascular risk factors. Plan: Urgent implementation of positive airway pressure therapy:  Confirm status of autoPAP machine prescription and delivery Determine if already initiated by sleep specialist Patient education provided on:  Importance of CPAP compliance for cognitive function, cardiovascular health Sleep hygiene measures Relationship between OSA, cognitive function, and vascular risk Lifestyle modifications:  Continue weight loss efforts Reduce/eliminate alcohol consumption Sleep position recommendations Follow up on CPAP implementation and compliance at next visit Coordinate with sleep specialist regarding ongoing management Hyperlipidemia, acquired Assessment: Mixed dyslipidemia with well-controlled LDL (11 mg/dL, previously 36 mg/dL in April 1610) on rosuvastatin 10 mg daily. However, persistent elevation in triglycerides (275 mg/dL) and low HDL (96.0 mg/dL) despite statin therapy. Current total cholesterol 91 mg/dL. Elevated triglycerides likely related to diabetes, alcohol consumption, and genetic factors. Plan: Continue rosuvastatin 10 mg daily Address elevated triglycerides:  Encourage alcohol reduction/cessation Continue omega-3 fatty acid supplementation Improve glycemic control with addition of empagliflozin Maintain weight loss  efforts Obtain follow-up lipid panel in 3 months Patient education provided on:  Dietary strategies to lower triglycerides Importance of exercise for raising HDL Relationship between alcohol and triglycerides Hypertension associated with diabetes (HCC) Assessment: Well-controlled hypertension with current BP 108/60 mmHg on multi-drug regimen (amlodipine 10 mg daily, atenolol 100 mg daily, losartan 100 mg  daily, spironolactone 50 mg daily). Historical values showed significantly elevated readings (170/90 in 2019, 150/108 in 2018), but all recent measurements have been at goal. Plan: Continue current antihypertensive regimen:  Amlodipine 10 mg daily Atenolol 100 mg daily Losartan 100 mg daily Spironolactone 50 mg daily Continue home BP monitoring as needed Maintain BP goal <130/80 given coexisting diabetes and CKD Reinforced lifestyle modifications:  Sodium restriction (<2g/day) Regular physical activity as tolerated Weight loss maintenance Alcohol reduction Monitor for orthostatic hypotension given multiple agents Vitamin D deficiency Assessment: Newly identified vitamin D deficiency with 25-OH vitamin D level of 23.09 ng/mL (deficient). This deficiency may contribute to overall health and potentially cognitive function. No current supplementation. Plan: Initiate vitamin D3 supplementation 2000 IU daily Patient education provided on:  Sun exposure recommendations Dietary sources of vitamin D Importance for bone health and general well-being Repeat vitamin D level in 3 months to assess response Consider dose adjustment based on follow-up level  Health Maintenance Assessment: Preventive care needs identified including: Overdue diabetic eye exam (never completed) Annual diabetic foot exam due April 2025 Influenza vaccination status unknown Plan: Ophthalmology referral placed for comprehensive diabetic eye exam Schedule next diabetic foot exam in April 2025 Recommend influenza  vaccination at start of next flu season Pneumococcal vaccination status to be reviewed at next visit Review smoking cessation options at next visit (current tobacco user) Follow-up Plan Schedule follow-up appointment in 1 month to:  Review lab results Assess response to medication changes Check status of empagliflozin and B12 therapy initiation Evaluate neurological symptoms Follow up on sleep apnea therapy implementation Coordinate with neurology, sleep medicine, and upcoming ophthalmology referrals Patient instructed to call sooner for any concerning symptoms, significant changes in neurological status, or difficulty with new medications Time spent: 45 minutes total face-to-face time including counseling on diagnosis, management options, reviewing results, performing examination, and care coordination.        Orders Placed During this Encounter:   Orders Placed This Encounter  Procedures   US  Renal    INS: [facility]  ID: [id number]  EPIC ORDER WT: [id number] NO NEEDS NO MEDICAL DEVICES  PT AWARE OF $75 NO SHOW FEE PT AWARE TO BRING PHOTO ID & INS CARD [facility] W/ PT/DT    Standing Status:   Future    Expiration Date:   05/18/2024    Reason for Exam (SYMPTOM  OR DIAGNOSIS REQUIRED):   ckd3, fh kidney dz    Preferred imaging location?:   [facility]   CBC with Differential/Platelet   Comprehensive metabolic panel with GFR    [place]    Has the patient fasted?:   No   Lipid panel    [place]    Has the patient fasted?:   No   Hemoglobin A1c    [place]   Microalbumin / creatinine urine ratio   B12 and Folate Panel   Intrinsic Factor Antibodies   Vitamin D (25 hydroxy)   Ambulatory referral to Ophthalmology    Referral Priority:   Routine    Referral Type:   Consultation    Referral Reason:   Specialty Services Required    Requested Specialty:   Ophthalmology    Number of Visits Requested:   1   Ambulatory referral to Ophthalmology    Referral Priority:   Routine     Referral Type:   Consultation    Referral Reason:   Specialty Services Required    Requested Specialty:   Ophthalmology    Number of Visits Requested:  1   Meds ordered this encounter  Medications   Semaglutide,0.25 or 0.5MG /DOS, (OZEMPIC, 0.25 OR 0.5 MG/DOSE,) 2 MG/3ML SOPN    Sig: Inject 0.5 mg into the skin once a week.    Dispense:  6 mL    Refill:  3   cyanocobalamin (VITAMIN B12) injection 1,000 mcg   Cyanocobalamin (B-12) 1000 MCG SUBL    Sig: Place 1 tablet under the tongue daily at 6 (six) AM.    Dispense:  90 tablet    Refill:  3   DISCONTD: cyanocobalamin (VITAMIN B12) 1000 MCG/ML injection    Sig: 100 mcg of cyanocobalamin is given daily for 1 week, then weekly for a month, and monthly for life    Dispense:  10 mL    Refill:  1    Dispense with needles syringes for adequate.   cyanocobalamin (VITAMIN B12) injection 1,000 mcg   empagliflozin (JARDIANCE) 10 MG TABS tablet    Sig: Take 1 tablet (10 mg total) by mouth daily before breakfast.    Dispense:  90 tablet    Refill:  3   cyanocobalamin (VITAMIN B12) injection 1,000 mcg         Additional Info: This encounter employed state-of-the-art, real-time, collaborative documentation. The patient actively reviewed and assisted in updating their electronic medical record on a shared screen, ensuring transparency and facilitating joint problem-solving for the problem list, overview, and plan. This approach promotes accurate, informed care. The treatment plan was discussed and reviewed in detail, including medication safety, potential side effects, and all patient questions. We confirmed understanding and comfort with the plan. Follow-up instructions were established, including contacting the office for any concerns, returning if symptoms worsen, persist, or new symptoms develop, and precautions for potential emergency department visits.  Time-Based Medical Decision Making Attestation I spent a total of  45 minutes on the date  of service (May 19, 2023) providing care for this patient. This time was spent in face-to-face activities, including counseling, coordination of care, and direct patient care. This extended time was medically necessary due to the complexity of this patient's multiple, interacting medical conditions requiring significant care coordination and complex decision-making. The time included the following components: Pre-Visit Preparation Review of previous medical records including neurology consultation from May 03, 2023 Analysis of home sleep study results from January 2025 showing severe OSA Review of laboratory trend data showing B12 deficiency and progression of CKD Assessment of weight and vital sign trends across multiple visits Review of medication list for potential interactions and adjustments Direct Patient Care (30 minutes) Comprehensive history focusing on neurological symptoms, diabetes management, and kidney function Targeted physical examination with attention to neurological status and gait In-depth discussion of B12 deficiency and its relationship to neurological symptoms Explanation of kidney function decline and importance of renoprotection Discussion of severe sleep apnea diagnosis and importance of treatment Evaluation of medication effectiveness and patient adherence Medical Decision Making and Care Coordination  Evaluation of complex diagnostic data including B12 levels, kidney function, lipid panels, and microalbumin results Integration of neurologist's findings regarding B12 deficiency and gait disturbance Assessment of sleep study results indicating severe OSA requiring urgent intervention Development of comprehensive management plan addressing multiple comorbidities Risk/benefit analysis for adding empagliflozin in setting of CKD Coordination of care with neurology, sleep medicine, and ophthalmology B12 injection administration and patient education Modification of  diabetes and CKD management plan Creation of comprehensive orders including renal ultrasound, ophthalmology referral, and laboratory testing This extended time was medically necessary due to:  Multiple complex chronic conditions (diabetes with CKD, B12 deficiency with neurological manifestations, severe OSA, hypertension) requiring careful coordination of care New diagnostic information (confirmed B12 deficiency, severe OSA, vitamin D deficiency) requiring treatment plan modifications Patient's cognitive impairment necessitating additional time for education and ensuring comprehension Need for careful medication reconciliation and assessment of adherence Complex neurological symptoms requiring correlation with laboratory findings and specialist input The complexity of this patient's presentation warranted the extended time spent to ensure comprehensive evaluation, appropriate treatment modifications, and thorough patient education regarding multiple interacting conditions.    This document was synthesized by artificial intelligence ([employer]) using HIPAA-compliant recording of the clinical interaction;   We discussed the use of AI scribe software for clinical note transcription with the patient, who gave verbal consent to proceed.

## 2023-05-19 NOTE — Assessment & Plan Note (Addendum)
 Assessment: Confirmed diagnosis based on neurological symptoms including difficulty with heel-to-toe walking (tandem gait), memory impairment, and documented low B12 level (210 pg/mL, October 2024). Despite oral B12 supplementation, patient failed to show improvement, suggesting malabsorption. Current B12 level shows significant improvement (>1537 pg/mL) following initiation of injectable therapy. Associated macrocytosis (MCV 101.4) noted on prior labs has improved (current MCV 97.8) but not fully normalized. Patient has multiple risk factors including regular alcohol consumption (reports 2 beers daily), which likely contributes to malabsorption of B12 and potentially exacerbates neurological symptoms. As noted by neurologist on May 03, 2023, neurological manifestations continue despite normalization of B12 levels, suggesting possible permanent damage. Folate level is now normal at 14.0. Plan: Administer B12 1000 mcg injection intramuscularly today in office Transition to maintenance protocol:  Continue B12 injection therapy: 1000 mcg weekly for 4 more weeks, then monthly thereafter Add sublingual B12 1000 mcg daily for enhanced absorption between injections Monitor neurological symptoms, particularly:  Gait stability and ability to perform heel-to-toe walking Memory function and cognition Balance and coordination Alcohol counseling provided - advised to reduce or eliminate alcohol consumption to improve B12 absorption and prevent further neurological damage Labs monitoring:  Check B12 levels in 3 months to ensure adequate maintenance Monitor CBC with particular attention to MCV to ensure resolution of macrocytosis Patient education provided regarding:  Limited potential for neurologic recovery despite B12 normalization Importance of continued injections for maintenance therapy Relationship between alcohol and B12 malabsorption Continue neurological follow-up as scheduled

## 2023-05-19 NOTE — Patient Instructions (Addendum)
 It was a pleasure seeing you today! Your health and satisfaction are our top priorities.  Scherrie Curt, MD  YOUR VISIT SUMMARY Date: May 19, 2023  WHAT WE DISCUSSED TODAY Thank you for coming in today. We discussed several important health conditions including your walking difficulties, memory concerns, diabetes, kidney function, and vitamin deficiencies. Below is a summary of what we found and the plan moving forward. YOUR ACTION PLAN   New Medications ? Jardiance (empagliflozin) 10 mg - Take 1 tablet by mouth daily before breakfast  Purpose: To help your diabetes and protect your kidneys  ? Vitamin B12 injections - Weekly for 4 weeks, then monthly  Purpose: To treat your B12 deficiency that's affecting your walking and balance  ? Sublingual B12 1000 mcg - Place 1 tablet under your tongue daily at 6 AM  Purpose: Additional B12 support between injections   Continue Medications ? Ozempic (semaglutide) 0.5 mg - Inject once weekly as you have been doing  Purpose: For diabetes control and continued weight loss  ? Continue all your other regular medications including your blood pressure medicines, cholesterol medicine, and aspirin  Tests Scheduled ? Kidney ultrasound at [facility] location  Purpose: To check the structure of your kidneys  ? Blood tests were done today to check your B12, vitamin D, kidney function, and diabetes control  Referrals ? Eye doctor referral placed - Watch for call to schedule  Purpose: Diabetic eye exam (overdue)   Follow-up ? Return to clinic in 1 month (approximately Jun 18, 2023)  Purpose: Check your response to new medications and review test results   IMPORTANT HEALTH INFORMATION VITAMIN B12 DEFICIENCY: Your B12 levels were previously very low, which is likely causing your walking difficulties and contributing to memory problems. Your levels are now improving with treatment, but you need to continue taking B12 to maintain these levels. Some improvement in your  symptoms is expected, but changes may be slow and some effects might be permanent.  DIABETES MANAGEMENT: Your diabetes control is fair with an A1c of 6.8%. We're adding a new medication (Jardiance) to help protect your kidneys and improve your blood sugar control. Continue checking your blood sugar once weekly. The Ozempic has been helping with your weight loss - you've lost about 9 pounds since January, which is excellent progress!  KIDNEY HEALTH: Your kidney function shows moderate decline (Stage 3 CKD). The new medication (Jardiance) will help protect your kidneys. Remember to avoid ibuprofen and similar pain relievers as they can harm your kidneys. Stay well-hydrated and continue taking your blood pressure medications.  SLEEP APNEA - URGENT TREATMENT NEEDED: Your sleep study showed severe sleep apnea with oxygen levels dropping dangerously low during sleep. This could be contributing to your memory problems and overall health issues. It's very important that you follow up with the sleep doctor and start using the CPAP machine that will be prescribed.  CALL OUR OFFICE OR SEEK MEDICAL ATTENTION if you experience: Significant worsening of your walking ability or balance Confusion or severe memory changes Symptoms of low blood sugar (shakiness, sweating, hunger, confusion) Increased urination, excessive thirst, or signs of dehydration with your new medication Any concerning side effects from your medications    HEALTHY LIFESTYLE RECOMMENDATIONS RECOMMENDATIONS FOR IMPROVEMENT: ?? Alcohol Reduction: Please consider eliminating or reducing your alcohol intake (currently 2 beers daily). Alcohol interferes with B12 absorption and can worsen memory problems. ???? Physical Activity: Continue with regular physical activity as tolerated, but be careful with your balance issues. Walking with  a companion is recommended. ?? Healthy Diet: Follow a balanced diet with controlled carbohydrates to help manage your  diabetes and support weight loss. ?? Smoking Cessation: Consider quitting smoking to improve your overall health and reduce risks of complications. ?? Hydration: Drink plenty of water, especially now that you're starting Jardiance, which can increase urination.    MEDICATION CHANGES SUMMARY Started Today Continuing Discontinued  Jardiance (empagliflozin) 10 mg daily B12 injections (weekly then monthly) Sublingual B12 1000 mcg daily Ozempic (semaglutide) 0.5 mg weekly All blood pressure medications Cholesterol medication Aspirin Allergy medication Oral B12 tablets (replaced with injections and sublingual) Doxycycline (you reported not taking this)  OUR CONTACT INFORMATION Office Phone: [phone] Hours: Monday-Friday, 8:00 AM - 5:00 PM After Hours: Call main number for on-call provider Next Appointment: Please schedule in approximately one month Thank you for trusting [place] with your care!     NEXT STEPS: [x]  Early Intervention: Schedule sooner appointment, call our on-call services, or go to emergency room if there is any significant Increase in pain or discomfort New or worsening symptoms Sudden or severe changes in your health [x]  Flexible Follow-Up: We recommend a No follow-ups on file. for optimal routine care. This allows for progress monitoring and treatment adjustments. [x]  Preventive Care: Schedule your annual preventive care visit! It's typically covered by insurance and helps identify potential health issues early. [x]  Lab & X-ray Appointments: Incomplete tests scheduled today, or call to schedule. X-rays: [facility] (M-F, 8:30am-noon or 1pm-5pm). [x]  Medical Information Release: Sign a release form at front desk to obtain relevant medical information we don't have.  MAKING THE MOST OF OUR FOCUSED 20 MINUTE APPOINTMENTS: [x]   Clearly state your top concerns at the beginning of the visit to focus our discussion [x]   If you anticipate you will need more  time, please inform the front desk during scheduling - we can book multiple appointments in the same week. [x]   If you have transportation problems- use our convenient video appointments or ask about transportation support. [x]   We can get down to business faster if you use MyChart to update information before the visit and submit non-urgent questions before your visit. Thank you for taking the time to provide details through MyChart.  Let our nurse know and she can import this information into your encounter documents.  Arrival and Wait Times: [x]   Arriving on time ensures that everyone receives prompt attention. [x]   Early morning (8a) and afternoon (1p) appointments tend to have shortest wait times. [x]   Unfortunately, we cannot delay appointments for late arrivals or hold slots during phone calls.  Getting Answers and Following Up [x]   Simple Questions & Concerns: For quick questions or basic follow-up after your visit, reach [place] at [phone] or MyChart messaging. [x]   Complex Concerns: If your concern is more complex, scheduling an appointment might be best. Discuss this with the staff to find the most suitable option. [x]   Lab & Imaging Results: We'll contact you directly if results are abnormal or you don't use MyChart. Most normal results will be on MyChart within 2-3 business days, with a review message from Dr. Jon Billings. Haven't heard back in 2 weeks? Need results sooner? Contact us at [phone]. [x]   Referrals: Our referral coordinator will manage specialist referrals. The specialist's office should contact you within 2 weeks to schedule an appointment. Call us if you haven't heard from them after 2 weeks.  Staying Connected [x]   MyChart: Activate your MyChart for the fastest  way to access results and message [place]. See the last page of this paperwork for instructions on how to activate.  Bring to Your Next Appointment [x]   Medications: Please bring all your medication bottles to  your next appointment to ensure we have an accurate record of your prescriptions. [x]   Health Diaries: If you're monitoring any health conditions at home, keeping a diary of your readings can be very helpful for discussions at your next appointment.  Billing [x]   X-ray & Lab Orders: These are billed by separate companies. Contact the invoicing company directly for questions or concerns. [x]   Visit Charges: Discuss any billing inquiries with our administrative services team.  Your Satisfaction Matters [x]   Share Your Experience: We strive for your satisfaction! If you have any complaints, or preferably compliments, please let Dr. Jon Billings know directly or contact our Practice Administrators, Edwena Felty or [employer], by asking at the front desk.   Reviewing Your Records [x]   Review this early draft of your clinical encounter notes below and the final encounter summary tomorrow on MyChart after its been completed.  All orders placed so far are visible here: Subacute combined degeneration [facility]) Assessment & Plan: Suspected due to difficulty walking heel to toe and memory issues. A neurologist recommended B12 injections, suggesting possible malabsorption, potentially exacerbated by alcohol consumption. Intramuscular injections are preferred initially for better absorption. Treatment involves a regimen of daily, weekly, and monthly injections to replenish B12 levels, with partial or full symptom reversal possible but unpredictable. Administer a 1000 mcg B12 injection intramuscularly today. Prescribe sublingual B12 tablets to improve absorption and home B12 injections: daily for a week, weekly for a month, then monthly. Order blood tests to check B12 levels and antibodies. Advise reducing alcohol consumption to improve absorption.   Orders: -     Cyanocobalamin -     B-12; Place 1 tablet under the tongue daily at 6 (six) AM.  Dispense: 90 tablet; Refill: 3 -     Cyanocobalamin  B12  deficiency Assessment & Plan: Suspected due to difficulty walking heel to toe and memory issues. A neurologist recommended B12 injections, suggesting possible malabsorption, potentially exacerbated by alcohol consumption. Intramuscular injections are preferred initially for better absorption. Treatment involves a regimen of daily, weekly, and monthly injections to replenish B12 levels, with partial or full symptom reversal possible but unpredictable. Administer a 1000 mcg B12 injection intramuscularly today. Prescribe sublingual B12 tablets to improve absorption and home B12 injections: daily for a week, weekly for a month, then monthly. Order blood tests to check B12 levels and antibodies. Advise reducing alcohol consumption to improve absorption.   Orders: -     Cyanocobalamin -     B-12; Place 1 tablet under the tongue daily at 6 (six) AM.  Dispense: 90 tablet; Refill: 3 -     B12 and Folate Panel -     Intrinsic Factor Antibodies -     Cyanocobalamin  Type 2 diabetes mellitus with stage 3 chronic kidney disease, without long-term current use of insulin, unspecified whether stage 3a or 3b CKD (HCC) Assessment & Plan: A1c is 6.8%. He is on semaglutide (Ozempic) 0.5 mg weekly, which is well-tolerated and aiding in weight loss. Blood sugar control is well-managed with dietary changes and weight loss. Increasing the dose could enhance weight loss but risks severe gastrointestinal side effects. The current dose is effective, and shared decision-making led to maintaining the current regimen. Continue semaglutide 0.5 mg weekly for a year. Check blood sugar once  a week. Order lab work to check A1c and assess diabetes control.  Orders: -     Ambulatory referral to Ophthalmology -     Ozempic (0.25 or 0.5 MG/DOSE); Inject 0.5 mg into the skin once a week.  Dispense: 6 mL; Refill: 3 -     Ambulatory referral to Ophthalmology -     CBC with Differential/Platelet -     Comprehensive metabolic panel with  GFR -     Lipid panel -     Hemoglobin A1c -     Microalbumin / creatinine urine ratio -     Empagliflozin; Take 1 tablet (10 mg total) by mouth daily before breakfast.  Dispense: 90 tablet; Refill: 3  Chronic kidney disease (CKD), active medical management without dialysis, stage 3 (moderate) (HCC) -     VITAMIN D 25 Hydroxy (Vit-D Deficiency, Fractures) -     US  RENAL; Future  FH: kidney disease  Memory loss Assessment & Plan: Potentially related to B12 deficiency, with a referral to a neuropsychologist for further evaluation. Alcohol consumption may exacerbate memory issues. Addressing B12 deficiency may improve memory. Follow up with a neuropsychologist and address B12 deficiency. Advise reducing alcohol consumption.   Obesity due to energy imbalance Assessment & Plan: Due to energy imbalance. He is losing weight with the current semaglutide dose and dietary changes. If weight loss plateaus, consider dose adjustment. Continue current semaglutide dose and dietary changes. Reassess if weight loss plateaus.

## 2023-05-20 ENCOUNTER — Encounter: Payer: Self-pay | Admitting: Internal Medicine

## 2023-05-20 DIAGNOSIS — I6782 Cerebral ischemia: Secondary | ICD-10-CM | POA: Insufficient documentation

## 2023-05-20 DIAGNOSIS — R4189 Other symptoms and signs involving cognitive functions and awareness: Secondary | ICD-10-CM | POA: Insufficient documentation

## 2023-05-20 DIAGNOSIS — Z419 Encounter for procedure for purposes other than remedying health state, unspecified: Secondary | ICD-10-CM | POA: Diagnosis not present

## 2023-05-21 MED ORDER — VITAMIN D3 50 MCG (2000 UT) PO CAPS: 2000.0000 [IU] | ORAL_CAPSULE | Freq: Every day | ORAL | 3 refills | Status: AC

## 2023-05-21 NOTE — Assessment & Plan Note (Signed)
 Assessment: Well-controlled hypertension with current BP 108/60 mmHg on multi-drug regimen (amlodipine 10 mg daily, atenolol 100 mg daily, losartan 100 mg daily, spironolactone 50 mg daily). Historical values showed significantly elevated readings (170/90 in 2019, 150/108 in 2018), but all recent measurements have been at goal. Plan: Continue current antihypertensive regimen:  Amlodipine 10 mg daily Atenolol 100 mg daily Losartan 100 mg daily Spironolactone 50 mg daily Continue home BP monitoring as needed Maintain BP goal <130/80 given coexisting diabetes and CKD Reinforced lifestyle modifications:  Sodium restriction (<2g/day) Regular physical activity as tolerated Weight loss maintenance Alcohol reduction Monitor for orthostatic hypotension given multiple agents

## 2023-05-21 NOTE — Assessment & Plan Note (Signed)
 Assessment: Severe B12 deficiency previously documented (210 pg/mL in October 2024) now corrected (current level >1537 pg/mL) with injectable therapy. Clinically manifested with neurological symptoms (difficulty with tandem gait, memory impairment) indicative of subacute combined degeneration. Patient has multiple risk factors for malabsorption including regular alcohol intake (2 beers daily). Folate level now normal at 14.0. Initial oral therapy was ineffective, suggesting possible pernicious anemia or severe malabsorption, pending intrinsic factor antibody testing ordered today. Plan: Continue maintenance B12 therapy per protocol outlined above Pending intrinsic factor antibody test to evaluate for pernicious anemia Ensure lifelong maintenance therapy given severity of neurological manifestations Counsel on optimal diet rich in B12 sources (meat, eggs, dairy) while emphasizing alcohol reduction Monitor for clinical improvement in neurological symptoms, though complete recovery may not occur due to chronicity of deficiency prior to treatment Follow up on intrinsic factor antibody results to determine need for any additional gastrointestinal evaluation

## 2023-05-21 NOTE — Assessment & Plan Note (Signed)
 Assessment: Severe obstructive sleep apnea confirmed on home sleep test (January 2025) with AHI of 41.2/hour, O2 nadir of 73%, and significant hypoxemia (>30 minutes below 88% saturation). Risk factors include obesity (BMI 32.23) and alcohol consumption. Currently untreated. Sleep specialist strongly recommended urgent treatment with positive airway pressure therapy. OSA likely contributing to cognitive issues, fatigue, and possibly exacerbating vascular risk factors. Plan: Urgent implementation of positive airway pressure therapy:  Confirm status of autoPAP machine prescription and delivery Determine if already initiated by sleep specialist Patient education provided on:  Importance of CPAP compliance for cognitive function, cardiovascular health Sleep hygiene measures Relationship between OSA, cognitive function, and vascular risk Lifestyle modifications:  Continue weight loss efforts Reduce/eliminate alcohol consumption Sleep position recommendations Follow up on CPAP implementation and compliance at next visit Coordinate with sleep specialist regarding ongoing management

## 2023-05-22 LAB — INTRINSIC FACTOR ANTIBODIES: Intrinsic Factor: POSITIVE — AB

## 2023-05-22 NOTE — Telephone Encounter (Signed)
 Spoke with patient wife and was advised will review via MyChart; nothing further needed at this time. Will contact office with any questions or concerns

## 2023-05-23 ENCOUNTER — Encounter: Payer: Self-pay | Admitting: Internal Medicine

## 2023-05-23 ENCOUNTER — Telehealth: Payer: Self-pay | Admitting: *Deleted

## 2023-05-23 DIAGNOSIS — D510 Vitamin B12 deficiency anemia due to intrinsic factor deficiency: Secondary | ICD-10-CM | POA: Insufficient documentation

## 2023-05-23 NOTE — Telephone Encounter (Signed)
 I received patient's pulse oximetry report.  He had an ONO while on PAP therapy on 05/18/2023 for a total duration of test time of 4 hours and 35 minutes.  Average oxygen saturation was 93.6%, nadir was 87% with time below or at 88% saturation of 1 minute and 23 seconds.  Please advise patient that his oxygen saturations are adequate while he is on PAP therapy.  He can follow-up as scheduled.

## 2023-05-23 NOTE — Telephone Encounter (Signed)
 Called main # on chart, pt's wife (on [place]) and LVM (ok per DPR) advising pt's ONO results on 4/10 showed adequate oxygen saturations while on pap therapy. He can follow-up as scheduled, next week on 4/22 at 1:45 with Amy.

## 2023-05-23 NOTE — Telephone Encounter (Signed)
 Received [employer] ONO results from 05/18/23. Results placed in Dr Dail Drought office for review.

## 2023-05-23 NOTE — Progress Notes (Signed)
 Reviewed Intrinsic Factor Antibody test from 05/23/2023. Key findings: Positive result confirming diagnosis of pernicious anemia. Clinical interpretation: Autoimmune condition causing impaired B12 absorption, explaining previous B12 deficiency despite oral supplementation. Next steps: Continue B12 injections weekly for 4 more weeks, then monthly lifelong; gastroenterology referral to evaluate for atrophic gastritis and increased gastric cancer risk; screening for other autoimmune conditions; regular monitoring of B12 levels and CBC every 3 months initially. See Patient Message for details.

## 2023-05-24 ENCOUNTER — Other Ambulatory Visit: Payer: Self-pay

## 2023-05-24 ENCOUNTER — Other Ambulatory Visit: Payer: Self-pay | Admitting: Internal Medicine

## 2023-05-24 DIAGNOSIS — I10 Essential (primary) hypertension: Secondary | ICD-10-CM

## 2023-05-24 DIAGNOSIS — D510 Vitamin B12 deficiency anemia due to intrinsic factor deficiency: Secondary | ICD-10-CM

## 2023-05-24 NOTE — Telephone Encounter (Signed)
 Referral to [facility] placed, please advise future labs you want ordered so we can schedule lab appt prior to follow up in office.

## 2023-05-25 ENCOUNTER — Other Ambulatory Visit: Payer: Self-pay

## 2023-05-25 ENCOUNTER — Telehealth: Payer: Self-pay

## 2023-05-25 ENCOUNTER — Other Ambulatory Visit (HOSPITAL_COMMUNITY): Payer: Self-pay

## 2023-05-25 DIAGNOSIS — E538 Deficiency of other specified B group vitamins: Secondary | ICD-10-CM

## 2023-05-25 DIAGNOSIS — E559 Vitamin D deficiency, unspecified: Secondary | ICD-10-CM

## 2023-05-25 DIAGNOSIS — D510 Vitamin B12 deficiency anemia due to intrinsic factor deficiency: Secondary | ICD-10-CM

## 2023-05-25 NOTE — Telephone Encounter (Signed)
 Pharmacy Patient Advocate Encounter   Received notification from CoverMyMeds that prior authorization for Jardiance 10MG  tablets is required/requested.   Insurance verification completed.   The patient is insured through [facility] [place] .   Per test claim: PA required; PA submitted to above mentioned insurance via CoverMyMeds Key/confirmation #/EOC  (Key: [id number])   Status is pending

## 2023-05-26 ENCOUNTER — Other Ambulatory Visit (HOSPITAL_COMMUNITY): Payer: Self-pay

## 2023-05-26 ENCOUNTER — Ambulatory Visit
Admission: RE | Admit: 2023-05-26 | Discharge: 2023-05-26 | Disposition: A | Discharge status: Home / Self Care | Source: Ambulatory Visit | Attending: Internal Medicine | Admitting: Internal Medicine

## 2023-05-26 DIAGNOSIS — K769 Liver disease, unspecified: Secondary | ICD-10-CM | POA: Diagnosis not present

## 2023-05-26 DIAGNOSIS — N183 Chronic kidney disease, stage 3 unspecified: Secondary | ICD-10-CM

## 2023-05-26 DIAGNOSIS — N281 Cyst of kidney, acquired: Secondary | ICD-10-CM | POA: Diagnosis not present

## 2023-05-26 NOTE — Telephone Encounter (Signed)
 Pharmacy Patient Advocate Encounter  Received notification from [facility] that Prior Authorization for JARDIANCE  10MG  has been APPROVED from 05/11/2023 to 05/25/2023. Ran test claim, Copay is $4.00/ 90 DAYS. This test claim was processed through [facility]- copay amounts may vary at other pharmacies due to pharmacy/plan contracts, or as the patient moves through the different stages of their insurance plan.   PA #/Case ID/Reference #: [id number]

## 2023-05-29 ENCOUNTER — Telehealth: Payer: Self-pay

## 2023-05-29 ENCOUNTER — Encounter: Payer: Self-pay | Admitting: *Deleted

## 2023-05-29 ENCOUNTER — Other Ambulatory Visit: Payer: Self-pay

## 2023-05-29 NOTE — Telephone Encounter (Signed)
 Copied from CRM [id number]. Topic: Clinical - Prescription Issue >> May 29, 2023 10:14 AM DeAngela L wrote: Reason for CRM: Patient needs needles for his B 12 injections and the pharmacy says the pt needs a prescription for Needles Patient call back number [phone] (M) Patient already picked up Cyanocobalamin  (B-12) prescription at  [facility] [address] [place] - [address] [place] [address] Phone: [phone] Fax: [phone]

## 2023-05-29 NOTE — Progress Notes (Deleted)
 Ralph Hurst

## 2023-05-30 ENCOUNTER — Telehealth: Payer: Medicaid Other | Admitting: Family Medicine

## 2023-05-30 NOTE — Progress Notes (Deleted)
   PATIENT: Ralph Hurst DOB: [birth year]  REASON FOR VISIT: follow up HISTORY FROM: patient  Virtual Visit via MyChart video  I connected with Ralph Hurst on 05/30/23 at  1:45 PM EDT via MyChart video and verified that I am speaking with the correct person using two identifiers.   I discussed the limitations, risks, security and privacy concerns of performing an evaluation and management service by Mychart video and the availability of in person appointments. I also discussed with the patient that there may be a patient responsible charge related to this service. The patient expressed understanding and agreed to proceed.   History of Present Illness:  05/30/23 ALL: Ralph Hurst is a 63 y.o. male here today for follow up.    History (copied from previous note)   Observations/Objective:  Generalized: Well developed, in no acute distress  Mentation: Alert oriented to time, place, history taking. Follows all commands speech and language fluent   Assessment and Plan:  63 y.o. year old male  has a past medical history of Diabetes mellitus without complication (HCC), Essential hypertension (09/14/2006), Hyperlipidemia, Hypertension, Need for immunization against influenza (10/11/2017), Polycythemia, Polycythemia, secondary (09/14/2006), Routine general medical examination at a health care facility (10/11/2017), and Sleep stage dysfunction (10/17/2016). here with  No diagnosis found.  No orders of the defined types were placed in this encounter.   No orders of the defined types were placed in this encounter.    Follow Up Instructions:  I discussed the assessment and treatment plan with the patient. The patient was provided an opportunity to ask questions and all were answered. The patient agreed with the plan and demonstrated an understanding of the instructions.   The patient was advised to call back or seek an in-person evaluation if the symptoms worsen or if the  condition fails to improve as anticipated.  I provided *** minutes of face-to-face and non face-to-face time during this MyChart video encounter. Patient located at their place of residence. Provider is in the office.    Ralph Cuppett, NP

## 2023-06-04 ENCOUNTER — Encounter: Payer: Self-pay | Admitting: Internal Medicine

## 2023-06-04 DIAGNOSIS — N200 Calculus of kidney: Secondary | ICD-10-CM | POA: Insufficient documentation

## 2023-06-04 DIAGNOSIS — N281 Cyst of kidney, acquired: Secondary | ICD-10-CM | POA: Insufficient documentation

## 2023-06-04 DIAGNOSIS — K760 Fatty (change of) liver, not elsewhere classified: Secondary | ICD-10-CM | POA: Insufficient documentation

## 2023-06-04 NOTE — Progress Notes (Signed)
 Reviewed renal ultrasound from 06/03/2023. Key findings: 3.4 cm septated cyst in right kidney requiring further evaluation with CT or MRI, 5 mm nonobstructive kidney stone in right kidney, and increased liver echogenicity suggesting fatty liver. Clinical interpretation: These are incidental findings that need appropriate follow-up but are not emergency concerns. Next steps: We will schedule additional imaging (CT or MRI) to further evaluate the kidney cyst before your next appointment on 06/19/2023. Maintain good hydration and continue current medications. See Patient Message for details.

## 2023-06-05 ENCOUNTER — Encounter: Payer: Self-pay | Admitting: Internal Medicine

## 2023-06-06 ENCOUNTER — Other Ambulatory Visit: Payer: Self-pay | Admitting: *Deleted

## 2023-06-06 MED ORDER — BD DISP NEEDLE 25G X 1" MISC: 2 refills | Status: DC

## 2023-06-07 NOTE — Telephone Encounter (Signed)
 read by Loraine Cura at 10:20AM on 06/05/2023.

## 2023-06-08 ENCOUNTER — Other Ambulatory Visit: Payer: Self-pay | Admitting: *Deleted

## 2023-06-08 ENCOUNTER — Ambulatory Visit: Payer: Self-pay

## 2023-06-08 DIAGNOSIS — G4733 Obstructive sleep apnea (adult) (pediatric): Secondary | ICD-10-CM | POA: Diagnosis not present

## 2023-06-08 MED ORDER — LUER LOCK SAFETY SYRINGES 25G X 1" 3 ML MISC: 2 refills | Status: DC

## 2023-06-08 NOTE — Telephone Encounter (Signed)
 Rx sent to the pharmacy.

## 2023-06-08 NOTE — Telephone Encounter (Signed)
 This RN contacted patient and patient stated they are standing at pharmacy awaiting for PCP to send prescription for actual syringes to accompany B12 and needles. Pharmacy is stating they need a prescription for the syringes themselves. This RN called [facility] and was assisted with a prescription sent over to [facility]. Patient informed.

## 2023-06-16 ENCOUNTER — Encounter: Payer: Self-pay | Admitting: Psychology

## 2023-06-19 ENCOUNTER — Ambulatory Visit (INDEPENDENT_AMBULATORY_CARE_PROVIDER_SITE_OTHER): Admitting: Internal Medicine

## 2023-06-19 ENCOUNTER — Encounter: Payer: Self-pay | Admitting: Internal Medicine

## 2023-06-19 VITALS — BP 93/60 | HR 68 | Ht 70.0 in | Wt 219.4 lb

## 2023-06-19 DIAGNOSIS — E559 Vitamin D deficiency, unspecified: Secondary | ICD-10-CM

## 2023-06-19 DIAGNOSIS — D7589 Other specified diseases of blood and blood-forming organs: Secondary | ICD-10-CM | POA: Insufficient documentation

## 2023-06-19 DIAGNOSIS — E1122 Type 2 diabetes mellitus with diabetic chronic kidney disease: Secondary | ICD-10-CM

## 2023-06-19 DIAGNOSIS — G4733 Obstructive sleep apnea (adult) (pediatric): Secondary | ICD-10-CM | POA: Diagnosis not present

## 2023-06-19 DIAGNOSIS — N281 Cyst of kidney, acquired: Secondary | ICD-10-CM

## 2023-06-19 DIAGNOSIS — E1129 Type 2 diabetes mellitus with other diabetic kidney complication: Secondary | ICD-10-CM | POA: Diagnosis not present

## 2023-06-19 DIAGNOSIS — D510 Vitamin B12 deficiency anemia due to intrinsic factor deficiency: Secondary | ICD-10-CM | POA: Diagnosis not present

## 2023-06-19 DIAGNOSIS — R42 Dizziness and giddiness: Secondary | ICD-10-CM | POA: Diagnosis not present

## 2023-06-19 DIAGNOSIS — Z419 Encounter for procedure for purposes other than remedying health state, unspecified: Secondary | ICD-10-CM | POA: Diagnosis not present

## 2023-06-19 DIAGNOSIS — R809 Proteinuria, unspecified: Secondary | ICD-10-CM | POA: Diagnosis not present

## 2023-06-19 DIAGNOSIS — Z0000 Encounter for general adult medical examination without abnormal findings: Secondary | ICD-10-CM

## 2023-06-19 DIAGNOSIS — M2550 Pain in unspecified joint: Secondary | ICD-10-CM | POA: Diagnosis not present

## 2023-06-19 DIAGNOSIS — N2889 Other specified disorders of kidney and ureter: Secondary | ICD-10-CM | POA: Diagnosis not present

## 2023-06-19 DIAGNOSIS — N200 Calculus of kidney: Secondary | ICD-10-CM

## 2023-06-19 DIAGNOSIS — Z7985 Long-term (current) use of injectable non-insulin antidiabetic drugs: Secondary | ICD-10-CM

## 2023-06-19 HISTORY — DX: Dizziness and giddiness: R42

## 2023-06-19 LAB — MICROALBUMIN / CREATININE URINE RATIO
Creatinine,U: 105.6 mg/dL
Microalb Creat Ratio: UNDETERMINED mg/g (ref 0.0–30.0)
Microalb, Ur: <0.7 mg/dL

## 2023-06-19 LAB — BASIC METABOLIC PANEL WITH GFR
BUN: 23 mg/dL (ref 6–23)
CO2: 25 meq/L (ref 19–32)
Calcium: 9.3 mg/dL (ref 8.4–10.5)
Chloride: 103 meq/L (ref 96–112)
Creatinine, Ser: 1.58 mg/dL — ABNORMAL HIGH (ref 0.40–1.50)
GFR: 46.24 mL/min — ABNORMAL LOW (ref >60.00)
Glucose, Bld: 115 mg/dL — ABNORMAL HIGH (ref 70–99)
Potassium: 4.2 meq/L (ref 3.5–5.1)
Sodium: 135 meq/L (ref 135–145)

## 2023-06-19 NOTE — Assessment & Plan Note (Addendum)
 Assessment: 3.4 cm septated cyst in the right kidney superior pole discovered on recent renal ultrasound. This represents a Bosniak class II-III lesion with complexity requiring further evaluation, especially concerning given patient's smoking history. Risk of malignancy exists with complex septated cysts. Plan: MRI abdomen with renal protocol (ordered today) with and without contrast for better characterization, preferred over CT to minimize nephrotoxicity given CKD Urology referral placed today for evaluation and management Discussed potential outcomes: if benign features on MRI, likely surveillance; if suspicious features, possible need for biopsy or surgical intervention Patient to call office immediately if experiencing flank pain, hematuria, or fever

## 2023-06-19 NOTE — Progress Notes (Signed)
 ==============================   [facility]: [phone]   -- Medical Office Visit --  Patient: Ralph Hurst      Age: 63 y.o.       Sex:  male  Date:   06/19/2023 Today's Healthcare Provider: Anthon Kins, MD  ==============================   Chief Complaint: Hypertension, Hyperlipidemia, Diabetes (Patient says he has not been given Syringes for the B-12 injections, they have been just receiving the needles. ), Headache (Patient says he has been waking up with what feels like a dull headache feels like a dull hangover from what he feels may be his CPAP. ), and Dizziness (Patient says when he bends over and stands up quickly, he notices some dizziness. /)  Discussed the use of AI scribe software for clinical note transcription with the patient, who gave verbal consent to proceed.  History of Present Illness 63 year old male with history of type 2 diabetes with CKD stage 3, hypertension, and OSA who presents with multiple concerns including dizziness, kidney issues, and headaches. The patient has been experiencing dizziness primarily when changing positions, such as when standing up quickly after bending over. This symptom corresponds with a notable decrease in his blood pressure readings since March 2025. Home monitoring shows consistently low readings, with today's office reading at 93/60, down significantly from previous years when readings were often in the hypertensive range (>150/90). The patient attributes this change primarily to weight loss from Ozempic  (semaglutide ) therapy, which has resulted in approximately one pound of weight loss weekly. He proactively stopped his amlodipine  due to these low readings. Recent renal ultrasound revealed a 3.4 cm septated cyst in the right kidney superior pole, along with a 5 mm stone in the right kidney inferior pole. These findings are concerning given his stage 3 CKD (current GFR 46.24, previously 54.90 in  April). His kidney function shows some decline with current creatinine of 1.58 mg/dL compared to 0.98 mg/dL in April. He recently started Jardiance  (empagliflozin ) to better control his diabetes and provide renal protection, taking it first thing in the morning before breakfast. Notably, he denies pain from the kidney cyst or stone. He has a positive smoking history which increases concern for the cystic renal lesion. Labs also showed hyperglycemia with fasting glucose of 115 mg/dL today (improved from 119 mg/dL in April). The patient reports experiencing dull headaches upon waking, which he associates with his CPAP machine use for obstructive sleep apnea. These headaches have a "hangover" quality but typically resolve as the day progresses. He also complains of persistent joint aches, which he is uncertain whether to attribute to aging or decreased mobility. He notes these symptoms have been ongoing without significant change. The patient receives weekly B12 injections for previously diagnosed pernicious anemia with positive intrinsic factor antibodies. He reports improved energy and mental clarity with this regimen, though notes he has received needles but not syringes for home administration. Lab work confirms very high B12 levels (>1537 in April). He has significantly reduced his alcohol consumption, which he believes has contributed to his improved health status. He previously consumed approximately 12 standard drinks weekly.  BP Readings from Last 30 Encounters:  06/19/23 93/60  05/19/23 108/60  05/03/23 98/69  03/08/23 116/74  02/14/23 118/70  01/19/23 129/81  01/02/23 120/71  12/01/22 112/65  06/01/22 128/78  05/11/22 120/78  10/11/17 (!) 170/90  10/17/16 (!) 150/108  08/01/16 (!) 180/90  06/22/16 (!) 162/92  06/15/16 (!) 150/90  01/12/15 130/90  03/20/14 110/80  01/08/14 120/84  05/30/13 130/90  12/17/12 120/80  10/12/11 120/88  07/29/10 130/90   Background Reviewed: Problem  List: has Hyperlipidemia; Tobacco use disorder; Type 2 diabetes with stage 3 chronic kidney disease GFR 30-59 (HCC); Hypertension associated with diabetes (HCC); OSA (obstructive sleep apnea); Memory loss; Subacute combined degeneration (HCC); Macrocytic anemia; Obesity due to energy imbalance; Tick bite of abdomen; Allergic rhinitis; Cerebral ischemia; Persistent cognitive impairment, vascular and nutritional, disabling; Pernicious anemia; Kidney cyst, acquired; Nephrolithiasis; Hepatic steatosis; Vitamin D  deficiency; Long-term current use of injectable noninsulin antidiabetic medication; and Macrocytosis associated with alcohol on their problem list. Past Medical History:  has a past medical history of Diabetes mellitus without complication (HCC), Essential hypertension (09/14/2006), Hyperlipidemia, Hypertension, Need for immunization against influenza (10/11/2017), Polycythemia, Polycythemia, secondary (09/14/2006), Routine general medical examination at a health care facility (10/11/2017), and Sleep stage dysfunction (10/17/2016). Past Surgical History:   has no past surgical history on file. Social History:   reports that he has been smoking cigarettes. He has never used smokeless tobacco. He reports current alcohol use of about 12.0 standard drinks of alcohol per week. He reports that he does not use drugs. Family History:  family history includes Asthma in his father and mother; COPD in his brother and mother; Diabetes in his mother and sister; Hearing loss in his brother and mother; Hyperlipidemia in his father, mother, and sister; Hypertension in his brother, father, mother, and sister; Kidney disease in his mother; Sleep apnea in his father, mother, and son. Allergies:  has no known allergies.   Medication Reconciliation: Current Outpatient Medications on File Prior to Visit  Medication Sig   aspirin  EC 81 MG tablet Take 1 tablet (81 mg total) by mouth daily.   atenolol  (TENORMIN ) 100 MG tablet  Take 1 tablet (100 mg total) by mouth daily.   Blood Glucose Monitoring Suppl DEVI 1 each by Does not apply route in the morning, at noon, and at bedtime. May substitute to any manufacturer covered by patient's insurance.   Cholecalciferol (VITAMIN D3) 50 MCG (2000 UT) capsule Take 1 capsule (2,000 Units total) by mouth daily.   Cyanocobalamin  (B-12) 1000 MCG SUBL Place 1 tablet under the tongue daily at 6 (six) AM.   empagliflozin  (JARDIANCE ) 10 MG TABS tablet Take 1 tablet (10 mg total) by mouth daily before breakfast.   fexofenadine (ALLEGRA ALLERGY) 180 MG tablet Take 180 mg by mouth daily.   Lancets (ONETOUCH ULTRASOFT) lancets Use once daily. Dx E11.9   losartan  (COZAAR ) 100 MG tablet Take 1 tablet (100 mg total) by mouth daily.   NEEDLE, DISP, 25 G (B-D DISP NEEDLE 25GX1") 25G X 1" MISC Use to give with B 12   Omega-3 Fatty Acids (FISH OIL) 1200 MG CAPS Take by mouth.   rosuvastatin  (CRESTOR ) 10 MG tablet Take 1 tablet (10 mg total) by mouth daily.   Semaglutide ,0.25 or 0.5MG /DOS, (OZEMPIC , 0.25 OR 0.5 MG/DOSE,) 2 MG/3ML SOPN Inject 0.5 mg into the skin once a week.   spironolactone  (ALDACTONE ) 50 MG tablet Take 1 tablet (50 mg total) by mouth daily.   SYRINGE-NEEDLE, DISP, 3 ML (LUER LOCK SAFETY SYRINGES) 25G X 1" 3 ML MISC Use to take B 12   Current Facility-Administered Medications on File Prior to Visit  Medication   cyanocobalamin  (VITAMIN B12) injection 1,000 mcg   cyanocobalamin  (VITAMIN B12) injection 1,000 mcg   Medications Discontinued During This Encounter  Medication Reason   doxycycline  (VIBRA -TABS) 100 MG tablet    amLODipine  (NORVASC ) 10 MG tablet Completed Course  Physical Exam:    06/19/2023   10:49 AM 06/19/2023   10:41 AM 05/19/2023    8:57 AM  Vitals with BMI  Height  5\' 10"  5\' 10"   Weight  219 lbs 6 oz 224 lbs 10 oz  BMI  31.48 32.23  Systolic 93 98 108  Diastolic 60 68 60  Pulse  68 78  Vital signs reviewed.  Nursing notes reviewed. Weight trend  reviewed. Physical Exam General Appearance:  No acute distress appreciable.   Well-groomed, healthy-appearing male.  Well proportioned with no abnormal fat distribution.  Good muscle tone. Pulmonary:  Normal work of breathing at rest, no respiratory distress apparent. SpO2: 96 %  Musculoskeletal: All extremities are intact.  Neurological:  Awake, alert, oriented, and engaged.  No obvious focal neurological deficits or cognitive impairments.  Sensorium seems unclouded.   Speech is clear and coherent with logical content. Psychiatric:  Appropriate mood, pleasant and cooperative demeanor, thoughtful and engaged during the exam Physical Exam Seems mentally sharper than in prior visits. Gait seems more stable.      Results for orders placed or performed in visit on 06/19/23  Basic Metabolic Panel (BMET)  Result Value Ref Range   Sodium 135 135 - 145 mEq/L   Potassium 4.2 3.5 - 5.1 mEq/L   Chloride 103 96 - 112 mEq/L   CO2 25 19 - 32 mEq/L   Glucose, Bld 115 (H) 70 - 99 mg/dL   BUN 23 6 - 23 mg/dL   Creatinine, Ser 0.98 (H) 0.40 - 1.50 mg/dL   GFR 11.91 (L) >47.82 mL/min   Calcium  9.3 8.4 - 10.5 mg/dL  Microalbumin / creatinine urine ratio  Result Value Ref Range   Microalb, Ur <0.7 mg/dL   Creatinine,U 956.2 mg/dL   Microalb Creat Ratio Unable to calculate 0.0 - 30.0 mg/g   Office Visit on 06/19/2023  Component Date Value   Sodium 06/19/2023 135    Potassium 06/19/2023 4.2    Chloride 06/19/2023 103    CO2 06/19/2023 25    Glucose, Bld 06/19/2023 115 (H)    BUN 06/19/2023 23    Creatinine, Ser 06/19/2023 1.58 (H)    GFR 06/19/2023 46.24 (L)    Calcium  06/19/2023 9.3    Microalb, Ur 06/19/2023 <0.7    Creatinine,U 06/19/2023 105.6    Microalb Creat Ratio 06/19/2023 Unable to calculate   Office Visit on 05/19/2023  Component Date Value   WBC 05/19/2023 7.5    RBC 05/19/2023 4.69    Hemoglobin 05/19/2023 15.9    HCT 05/19/2023 45.9    MCV 05/19/2023 97.8    MCHC  05/19/2023 34.7    RDW 05/19/2023 11.8    Platelets 05/19/2023 221.0    Neutrophils Relative % 05/19/2023 70.5    Lymphocytes Relative 05/19/2023 18.9    Monocytes Relative 05/19/2023 6.4    Eosinophils Relative 05/19/2023 3.4    Basophils Relative 05/19/2023 0.8    Neutro Abs 05/19/2023 5.3    Lymphs Abs 05/19/2023 1.4    Monocytes Absolute 05/19/2023 0.5    Eosinophils Absolute 05/19/2023 0.3    Basophils Absolute 05/19/2023 0.1    Sodium 05/19/2023 137    Potassium 05/19/2023 4.7    Chloride 05/19/2023 101    CO2 05/19/2023 28    Glucose, Bld 05/19/2023 176 (H)    BUN 05/19/2023 20    Creatinine, Ser 05/19/2023 1.37    Total Bilirubin 05/19/2023 0.5    Alkaline Phosphatase 05/19/2023 55    AST 05/19/2023 18  ALT 05/19/2023 25    Total Protein 05/19/2023 6.6    Albumin 05/19/2023 4.5    GFR 05/19/2023 54.90 (L)    Calcium  05/19/2023 9.6    Cholesterol 05/19/2023 91    Triglycerides 05/19/2023 275.0 (H)    HDL 05/19/2023 25.60 (L)    VLDL 05/19/2023 55.0 (H)    LDL Cholesterol 05/19/2023 11    Total CHOL/HDL Ratio 05/19/2023 4    NonHDL 05/19/2023 65.55    Hgb A1c MFr Bld 05/19/2023 6.8 (H)    Microalb, Ur 05/19/2023 <0.7    Creatinine,U 05/19/2023 146.0    Microalb Creat Ratio 05/19/2023 Unable to calculate    Vitamin B-12 05/19/2023 >1537 (H)    Folate 05/19/2023 14.0    Intrinsic Factor 05/19/2023 Positive (A)    VITD 05/19/2023 23.09 (L)   Office Visit on 12/01/2022  Component Date Value   Microalb, Ur 12/01/2022 <0.7    Creatinine,U 12/01/2022 111.6    Microalb Creat Ratio 12/01/2022 0.6    Sodium 12/01/2022 136    Potassium 12/01/2022 4.1    Chloride 12/01/2022 100    CO2 12/01/2022 28    Glucose, Bld 12/01/2022 264 (H)    BUN 12/01/2022 18    Creatinine, Ser 12/01/2022 1.41    Total Bilirubin 12/01/2022 0.6    Alkaline Phosphatase 12/01/2022 53    AST 12/01/2022 18    ALT 12/01/2022 21    Total Protein 12/01/2022 6.7    Albumin 12/01/2022 4.2     GFR 12/01/2022 53.21 (L)    Calcium  12/01/2022 9.4    Hgb A1c MFr Bld 12/01/2022 6.8 (H)    WBC 12/01/2022 6.1    RBC 12/01/2022 4.53    Platelets 12/01/2022 197.0    Hemoglobin 12/01/2022 15.3    HCT 12/01/2022 45.9    MCV 12/01/2022 101.4 (H)    MCHC 12/01/2022 33.4    RDW 12/01/2022 12.5    Vitamin B-12 12/01/2022 210 (L)   No image results found. US  Renal Result Date: 06/03/2023 CLINICAL DATA:  ckd3, fh kidney dz EXAM: RENAL / URINARY TRACT ULTRASOUND COMPLETE COMPARISON:  None Available. FINDINGS: Right Kidney: Renal measurements: 12.2 x 5.9 x 4.5 cm = volume: 170 mL. Echogenicity within normal limits. No hydronephrosis visualized. In the superior pole of the RIGHT kidney, there is an anechoic mass with several prominent internal septations which measures 3.4 x 2.3 x 2.5 cm. Echogenic focus with posterior acoustic shadowing in the inferior pole measuring up to 5 mm. Left Kidney: Renal measurements: 11.8 x 6.1 x 5.2 cm = volume: 195 mL. Echogenicity within normal limits. No mass or hydronephrosis visualized. Bladder: Appears normal for degree of bladder distention. Other: Increased hepatic echogenicity. IMPRESSION: 1. There is a 3.4 cm septated cyst in the superior pole of the RIGHT kidney. This likely reflects a mildly complicated cyst. Recommend further evaluation with dedicated renal protocol CT or MRI with and without contrast. 2. Nonobstructive RIGHT nephrolithiasis versus vascular calcification. 3. Increased hepatic echogenicity as can be seen in hepatic steatosis. These results will be called to the ordering clinician or representative by the Radiologist Assistant, and communication documented in the PACS or [employer]. Electronically Signed   By: Clancy Crimes M.D.   On: 06/03/2023 11:18        02/14/2023   10:49 AM 01/19/2023   11:05 AM 12/12/2022    8:59 AM 12/01/2022    8:28 AM  PHQ 2/9 Scores  PHQ - 2 Score 0 0 0 0  PHQ- 9  Score 2   0   Results LABS Glucose:  elevated Cholesterol: LDL 11 (April 2025)  RADIOLOGY Ultrasound renal: 3.4 cm septated cyst right kidney, 5 mm stone right kidney, hepatic steatosis (May 19 2023)    Assessment & Plan Complex renal cyst Assessment: 3.4 cm septated cyst in the right kidney superior pole discovered on recent renal ultrasound. This represents a Bosniak class II-III lesion with complexity requiring further evaluation, especially concerning given patient's smoking history. Risk of malignancy exists with complex septated cysts. Plan: MRI abdomen with renal protocol (ordered today) with and without contrast for better characterization, preferred over CT to minimize nephrotoxicity given CKD Urology referral placed today for evaluation and management Discussed potential outcomes: if benign features on MRI, likely surveillance; if suspicious features, possible need for biopsy or surgical intervention Patient to call office immediately if experiencing flank pain, hematuria, or fever Diabetes mellitus with proteinuria (HCC) Assessment: Diabetes with renal manifestations including GFR 46.24 (declined from 54.90 in April). Current creatinine elevated at 1.58 mg/dL (was 1.61 mg/dL in April). Recent A1c 6.8% (April) represents improved glycemic control. Blood glucose today 115 mg/dL, improved from 096 mg/dL in April. Recently started on Jardiance  for dual purpose of glycemic control and renal protection. Microalbumin undetectable on repeated testing, indicating no significant albuminuria despite CKD. Plan: Continue Jardiance  10 mg daily before breakfast Continue Ozempic  0.5 mg weekly for glycemic control and weight management Ordered urine protein/creatinine ratio to further assess kidney damage Ordered basic metabolic panel and urinalysis Increase fluid intake (target 2-3 liters daily) to prevent Jardiance -associated dehydration Monitor blood glucose 1-2 times daily Follow up in 3 months with repeat  labs Dizziness Assessment: Patient experiencing dizziness with positional changes, likely orthostatic hypotension. Today's blood pressure 93/60, significantly lower than previous readings. Likely multifactorial: weight loss effect on blood pressure, Jardiance  effect (induces osmotic diuresis), and possibly excessive antihypertensive therapy. Symptoms correspond with medication changes and weight loss. Plan: Discontinue amlodipine  completely - this was appropriate and proactive on patient's part Consider reducing atenolol  if BP remains consistently below 100/60 Encourage slow positional changes (sitting up slowly, pausing before standing) Ensure adequate hydration (2-3 liters daily) Monitor home BP daily until symptoms resolve, then 2-3 times weekly Contact office if dizziness worsens or if experiencing syncope/near-syncope Vitamin D  deficiency Assessment: Vitamin D  level low at 23.09 ng/mL in April. Associated with chronic kidney disease, which impairs vitamin D  metabolism, and limited sun exposure. No evidence of secondary hyperparathyroidism as calcium  levels remain normal (9.3 mg/dL today). Plan: Continue current vitamin D  supplementation at 2,000 IU daily Consider increasing to 4,000 IU daily for 3 months if not improved at next visit Encourage 15-30 minutes of sun exposure several times weekly as weather permits Recheck vitamin D  level in 3 months with next lab panel  Kidney stone Assessment: 5 mm non-obstructing stone in the right kidney inferior pole found on renal ultrasound. Asymptomatic currently. May represent increased risk given CKD. Plan: Urology referral placed (same referral addressing renal cyst) Increase fluid intake to 2-3 liters daily Use urine strainer if stone passage suspected to identify stone type Follow low-sodium diet and moderate protein intake Limit oxalate-rich foods if stone suspected to be calcium  oxalate Return immediately to care if experiencing severe  flank pain, nausea/vomiting, or hematuria OSA (obstructive sleep apnea) Assessment: Known OSA on CPAP therapy. Morning headaches likely related to CPAP use, possibly due to sinus congestion, mask leak, or incorrect pressure settings. No signs of hypercapnia. May also be contributing to orthostatic hypotension symptoms if sleep  fragmentation occurring. Plan: Use CPAP humidifier if available to reduce nasal congestion Clean CPAP equipment regularly with vinegar solution to reduce irritation Consider saline nasal spray before bedtime to reduce nasal congestion If headaches persist, consider CPAP pressure adjustment through sleep medicine Monitor morning blood pressure readings to assess correlation with headaches Arthralgia, unspecified joint Assessment: Multiple joint pain without evidence of inflammatory arthritis. Possible contributing factors include statin therapy, age-related changes, and decreased physical activity. No synovitis or limitations on examination. Plan: Continue rosuvastatin  given cardiovascular benefits outweigh risks in context of diabetes Use acetaminophen up to 3,000 mg daily as needed for pain (avoiding NSAIDs due to CKD) Gentle resistance exercise program with bands or water-based exercise Consider physical therapy if symptoms limit function Apply local heat to affected joints Pernicious anemia Assessment: History of pernicious anemia with positive intrinsic factor antibodies in April. Currently on B12 supplementation with very high B12 levels (>1537 in April). Reports improved energy and cognition with therapy. MCV normal at 97.8. Plan: Continue weekly B12 injections Provide proper syringes for self-administration (was missing) Continue sublingual B12 1000 mcg daily as supplementation Recheck B12 levels and CBC in 6 months Monitor neurological symptoms Healthcare maintenance Tobacco cessation encouraged - discussed smoking cessation options Continue moderate alcohol  reduction efforts Annual diabetic eye exam due Continue aspirin  81 mg daily for cardiovascular protection Next A1c in 3 months Influenza vaccination due in fall Type 2 diabetes mellitus with stage 3 chronic kidney disease, without long-term current use of insulin, unspecified whether stage 3a or 3b CKD (HCC)        Orders Placed During this Encounter:   Protein / creatinine ratio, urine         MR Abdomen W Wo Contrast       Comments: Specimen [id number]: "MRI abdomen (renal protocol) with and without contrast to further evaluate a complex cystic mass (3.4 cm) in the superior pole of the right kidney seen on prior ultrasound. Patient has low GFR due to diabetic nephropathy; please assess feasibility of gadolinium use."     Ambulatory referral to Urology           Microalbumin / creatinine urine ratio         Urinalysis w microscopic + reflex cultur         Basic Metabolic Panel (BMET)              This document was synthesized by artificial intelligence ([employer]) using HIPAA-compliant recording of the clinical interaction;   We discussed the use of AI scribe software for clinical note transcription with the patient, who gave verbal consent to proceed. additional Info: This encounter employed state-of-the-art, real-time, collaborative documentation. The patient actively reviewed and assisted in updating their electronic medical record on a shared screen, ensuring transparency and facilitating joint problem-solving for the problem list, overview, and plan. This approach promotes accurate, informed care. The treatment plan was discussed and reviewed in detail, including medication safety, potential side effects, and all patient questions. We confirmed understanding and comfort with the plan. Follow-up instructions were established, including contacting the office for any concerns, returning if symptoms worsen, persist, or new symptoms develop, and precautions for potential emergency  department visits.   Total face-to-face time spent with patient: 45 minutes, including counseling on diagnostic results, extensive medication management, risk assessment for renal cyst, and coordination of care with urology. Medical Decision Making Attestation Medical decision making for today's encounter demonstrates high complexity (Level 5) based on the following elements: 1. Number and Complexity of  Problems Addressed Acute or chronic illness with severe exacerbation: Type 2 diabetes with demonstrated progression of chronic kidney disease (GFR decline from 54.90 to 46.24, creatinine increase from 1.37 to 1.58) Condition threatening bodily function: Complex 3.4 cm septated renal cyst (Bosniak II-III) requiring urgent evaluation due to malignancy risk, especially concerning with smoking history Undiagnosed new problem with uncertain prognosis: New finding of complex renal cyst requiring differential consideration between benign cyst, renal cell carcinoma, and other neoplastic processes Multiple chronic illnesses with progression: Management of multiple interacting conditions including diabetes, CKD, hypertension, renal cyst, renal calculus, and orthostatic hypotension 2. Amount/Complexity of Data Reviewed and Analyzed Independent interpretation of tests: Comprehensive analysis and interpretation of renal ultrasound showing complex cyst and stone, with assessment of malignancy risk Review of prior external note: Assessment of ultrasound report findings and correlation with current clinical presentation Assessment requiring independent historian: Detailed blood pressure history requiring correlation between home measurements and clinical findings Review and analysis of unique test results: Comprehensive metabolic panel, microalbumin/creatinine ratio Analysis of physiologic data: Interpretation of blood pressure trends over time including significant pattern change requiring medication adjustment 3.  Risk of Complications, Morbidity, or Mortality High risk of morbidity from diagnostic testing: Decision regarding contrast administration for MRI in setting of compromised renal function (GFR 46) Decision regarding hospitalization: Evaluation of complex renal cyst with consideration of whether outpatient management is appropriate versus hospital admission Diagnosis or treatment significantly limited by social determinants: Management of multiple medical problems in setting of medication access issues (missing syringes for B12) Drug therapy requiring intensive monitoring: Management of SGLT2 inhibitor (Jardiance ) in setting of CKD with hypotensive symptoms Decision regarding elective major surgery: Evaluation and referral for potential surgical intervention for complex renal cyst Time Statement Total time personally spent by the physician on the date of encounter: 45 minutes, including pre-visit chart review, examination, medical decision making, and post-visit documentation. The extended duration of this visit was medically necessary due to the complexity of multiple medical problems, need for careful evaluation of potentially serious renal findings, and extensive counseling regarding diagnostic results and treatment options.

## 2023-06-19 NOTE — Assessment & Plan Note (Addendum)
 Assessment: Patient experiencing dizziness with positional changes, likely orthostatic hypotension. Today's blood pressure 93/60, significantly lower than previous readings. Likely multifactorial: weight loss effect on blood pressure, Jardiance  effect (induces osmotic diuresis), and possibly excessive antihypertensive therapy. Symptoms correspond with medication changes and weight loss. Plan: Discontinue amlodipine  completely - this was appropriate and proactive on patient's part Consider reducing atenolol  if BP remains consistently below 100/60 Encourage slow positional changes (sitting up slowly, pausing before standing) Ensure adequate hydration (2-3 liters daily) Monitor home BP daily until symptoms resolve, then 2-3 times weekly Contact office if dizziness worsens or if experiencing syncope/near-syncope

## 2023-06-19 NOTE — Patient Instructions (Addendum)
 VISIT SUMMARY AND INSTRUCTIONS   Thank you for your visit today. Below is a summary of your health conditions, medication changes, and important instructions. Today's Key Findings: Test Your Value Target Range  Blood Pressure 93/60 [LOW] 110-130/70-80  Blood Sugar 115 [ELEVATED] 70-99 mg/dL  Kidney Function (GFR) 46.24 [REDUCED] Greater than 60   MEDICATION CHANGES   Stop Taking Amlodipine  10 mg - Discontinue completely due to low blood pressure  Continue Taking - Jardiance  10 mg daily (take before breakfast) - Ozempic  0.5 mg weekly injection - All other medications as previously prescribed   New Items Syringes for B12 injections will be provided  KIDNEY CYST INFORMATION: The ultrasound found a 3.4 cm cyst with internal septations in your right kidney. This requires further evaluation with an MRI. This type of cyst is often benign, but we need to confirm this with additional imaging.  YOUR CARE PLAN:    Kidney Cyst and Stone: MRI has been ordered to better evaluate the kidney cyst. Referral to urology has been placed.    Low Blood Pressure: Monitor your blood pressure daily at home. Rise slowly from sitting or lying positions.    Diabetes: Continue Jardiance  and Ozempic . Check blood sugar 1-2 times daily.    Headaches: Use CPAP humidifier if available. Clean equipment regularly with vinegar solution. IMPORTANT HYDRATION GUIDANCE: Drink 2-3 liters (8-12 cups) of water daily. This is especially important while taking Jardiance  to prevent dehydration and help manage your kidney stone.  FOLLOW-UP APPOINTMENTS: ?? Urology appointment: The office will call you to schedule ?? MRI abdomen: The imaging center will call to schedule ?? Primary care follow-up: 3 months with lab work before visit     CALL IMMEDIATELY or GO TO ER if you experience: Severe pain in your back or side Blood in your urine Fainting or severe dizziness Fever with back pain Inability to keep fluids down     Additional Instructions: Use acetaminophen (Tylenol) as needed for joint pain, up to 3,000 mg daily Consider gentle exercise like walking or water-based activities for joint pain If you pass a kidney stone, try to collect it in a strainer for analysis Continue weekly B12 injections using the new syringes Avoid NSAIDs like ibuprofen (Advil, Motrin) and naproxen (Aleve) due to kidney disease Please call our office at [phone] if you have any questions or concerns.  ALLERGY MANAGEMENT PLAN  This plan is designed to help manage your allergic rhinitis (nasal allergies) effectively. Follow these steps daily for best results.  Sinus saline sprays- use nightly, and after sneezing episodes or exposure to allergen.  Insert deeply and spray mist into nose while leaning over sink at 45 degrees,  while gently breathing. Also blow out onto tissue while leaning forward 45 degrees. Once daily, after a sinus rinse, use sensimist.  Just before bedtime is best. This only needed if allergies acting up.  If this is inadequate add-on once daily for levocetirizine / xyzal 5 mg for nondrowsy antihistamine Take benadryl 25 mg at bedtime also if allergic mucus is persisting  When allergies cause chronic swelling in sinuses, it leads to sinus infections:    DAILY TREATMENT ROUTINE   Time of Day Treatment Steps  Morning 1. Saline Nasal Spray - Use to cleanse nasal passages 2. Xyzal (levocetirizine) - Take one tablet daily   Throughout Day Saline Nasal Spray - Use 2 additional times (mid-day and afternoon)   Evening/Bedtime 1. Saline Nasal Rinse - Thoroughly clean nasal passages 2. Flonase Sensimist - Apply  after nasal rinse 3. Benadryl (diphenhydramine) - Take 25mg  if experiencing persistent congestion    PROPER TECHNIQUE GUIDE       Saline Nasal Spray/Rinse Technique: Lean forward over sink at a 45-degree angle Turn head slightly to one side Insert spray tip into upper nostril Spray gently while  breathing lightly through your nose Repeat on other side Gently blow nose to clear excess solution Use saline spray 3 times daily to keep nasal passages moist and clear allergens.       Flonase Sensimist Technique: Shake bottle gently before each use Prime the bottle if it's new or hasn't been used for a week Tilt your head forward slightly Insert tip into nostril, pointing away from the center of your nose Spray while inhaling gently Repeat in other nostril Use Flonase Sensimist once daily, preferably at bedtime after using saline rinse. It may take several days of regular use to feel maximum benefit.   WHY FLONASE SENSIMIST?   Benefits of Flonase Sensimist:  Alcohol-free and scent-free formula - gentler on sensitive nasal passages Fine mist application - more comfortable with less dripping down throat Effectively relieves nasal congestion, sneezing, runny nose, and even eye symptoms 24-hour relief with once-daily dosing Uses a more potent form of fluticasone that works at a lower dose Less liquid per spray means less discomfort  UNDERSTANDING YOUR MEDICATIONS   Medication How It Works Important Notes  Flonase Sensimist (fluticasone furoate) Reduces inflammation in nasal passages, addressing the underlying cause of allergy symptoms - Takes several days for full effect - Use daily for best results - Safe for long-term use   Xyzal (levocetirizine) Blocks histamine to reduce allergy symptoms like sneezing and itching - Take at the same time each day - May cause drowsiness in some people - Once-daily dosing   Benadryl (diphenhydramine) Antihistamine that provides additional relief for breakthrough symptoms - Causes drowsiness - Use only at bedtime - For occasional use when needed   Saline Spray/Rinse Physically removes allergens and moistens nasal passages - Safe to use frequently - Improves effectiveness of other treatments - Reduces nasal irritation    CONTACT YOUR PROVIDER  IF: Your symptoms do not improve after 1-2 weeks of following this plan You develop sinus pain with fever or green/yellow discharge You experience frequent nosebleeds You develop new or worsening symptoms You have questions about your treatment plan     ADDITIONAL ALLERGY MANAGEMENT TIPS   HELPFUL STRATEGIES: ?? Keep windows closed during high pollen seasons ??? Use allergen-proof covers for pillows and mattresses ?? Vacuum regularly with a HEPA filter vacuum ?? Shower and change clothes after spending time outdoors ?? Check local pollen counts and limit outdoor time when counts are high ?? Stay well-hydrated to help keep mucous membranes moist

## 2023-06-19 NOTE — Assessment & Plan Note (Addendum)
 Assessment: Known OSA on CPAP therapy. Morning headaches likely related to CPAP use, possibly due to sinus congestion, mask leak, or incorrect pressure settings. No signs of hypercapnia. May also be contributing to orthostatic hypotension symptoms if sleep fragmentation occurring. Plan: Use CPAP humidifier if available to reduce nasal congestion Clean CPAP equipment regularly with vinegar solution to reduce irritation Consider saline nasal spray before bedtime to reduce nasal congestion If headaches persist, consider CPAP pressure adjustment through sleep medicine Monitor morning blood pressure readings to assess correlation with headaches

## 2023-06-19 NOTE — Assessment & Plan Note (Addendum)
 Assessment: Multiple joint pain without evidence of inflammatory arthritis. Possible contributing factors include statin therapy, age-related changes, and decreased physical activity. No synovitis or limitations on examination. Plan: Continue rosuvastatin  given cardiovascular benefits outweigh risks in context of diabetes Use acetaminophen up to 3,000 mg daily as needed for pain (avoiding NSAIDs due to CKD) Gentle resistance exercise program with bands or water-based exercise Consider physical therapy if symptoms limit function Apply local heat to affected joints

## 2023-06-19 NOTE — Assessment & Plan Note (Addendum)
 Assessment: 5 mm non-obstructing stone in the right kidney inferior pole found on renal ultrasound. Asymptomatic currently. May represent increased risk given CKD. Plan: Urology referral placed (same referral addressing renal cyst) Increase fluid intake to 2-3 liters daily Use urine strainer if stone passage suspected to identify stone type Follow low-sodium diet and moderate protein intake Limit oxalate-rich foods if stone suspected to be calcium  oxalate Return immediately to care if experiencing severe flank pain, nausea/vomiting, or hematuria

## 2023-06-19 NOTE — Assessment & Plan Note (Signed)
 Assessment: History of pernicious anemia with positive intrinsic factor antibodies in April. Currently on B12 supplementation with very high B12 levels (>1537 in April). Reports improved energy and cognition with therapy. MCV normal at 97.8. Plan: Continue weekly B12 injections Provide proper syringes for self-administration (was missing) Continue sublingual B12 1000 mcg daily as supplementation Recheck B12 levels and CBC in 6 months Monitor neurological symptoms

## 2023-06-19 NOTE — Assessment & Plan Note (Addendum)
 Assessment: Vitamin D  level low at 23.09 ng/mL in April. Associated with chronic kidney disease, which impairs vitamin D  metabolism, and limited sun exposure. No evidence of secondary hyperparathyroidism as calcium  levels remain normal (9.3 mg/dL today). Plan: Continue current vitamin D  supplementation at 2,000 IU daily Consider increasing to 4,000 IU daily for 3 months if not improved at next visit Encourage 15-30 minutes of sun exposure several times weekly as weather permits Recheck vitamin D  level in 3 months with next lab panel

## 2023-06-20 ENCOUNTER — Ambulatory Visit: Payer: Self-pay | Admitting: Internal Medicine

## 2023-06-20 DIAGNOSIS — N281 Cyst of kidney, acquired: Secondary | ICD-10-CM

## 2023-06-20 LAB — PROTEIN / CREATININE RATIO, URINE
Creatinine, Urine: 106 mg/dL (ref 20–320)
Protein/Creat Ratio: 104 mg/g{creat} (ref 25–148)
Protein/Creatinine Ratio: 0.104 mg/mg{creat} (ref 0.025–0.148)
Total Protein, Urine: 11 mg/dL (ref 5–25)

## 2023-06-20 LAB — NO CULTURE INDICATED

## 2023-06-20 LAB — URINALYSIS W MICROSCOPIC + REFLEX CULTURE
Bacteria, UA: NONE SEEN /HPF
Bilirubin Urine: NEGATIVE
Hgb urine dipstick: NEGATIVE
Hyaline Cast: NONE SEEN /LPF
Ketones, ur: NEGATIVE
Leukocyte Esterase: NEGATIVE
Nitrites, Initial: NEGATIVE
Protein, ur: NEGATIVE
RBC / HPF: NONE SEEN /HPF (ref 0–2)
Specific Gravity, Urine: 1.026 (ref 1.001–1.035)
Squamous Epithelial / HPF: NONE SEEN /HPF (ref <=5)
WBC, UA: NONE SEEN /HPF (ref 0–5)
pH: 5.5 (ref 5.0–8.0)

## 2023-06-20 NOTE — Progress Notes (Signed)
 I know we planned 2 months follow up but we see some possibly worsening kiidney function so I think we should check in  in just 1-2 weeks, drink lots of fluids, no ibuprofen or NSAIDs. Also want to go ahead and put nephrology and urology referrals urgently if agreeable.  Kidney filtering rate (GFR) Lab Results      Component                Value               Date                      GFR                      46.24 (L)           06/19/2023                GFR                      54.90 (L)           05/19/2023                GFR                      53.21 (L)           12/01/2022                GFR                      61.71               05/25/2022                GFR                      87.64               10/11/2017

## 2023-06-21 ENCOUNTER — Other Ambulatory Visit: Payer: Self-pay

## 2023-06-21 DIAGNOSIS — Z841 Family history of disorders of kidney and ureter: Secondary | ICD-10-CM

## 2023-06-21 DIAGNOSIS — N183 Chronic kidney disease, stage 3 unspecified: Secondary | ICD-10-CM

## 2023-06-21 DIAGNOSIS — Z8419 Family history of other disorders of kidney and ureter: Secondary | ICD-10-CM

## 2023-06-22 DIAGNOSIS — G4733 Obstructive sleep apnea (adult) (pediatric): Secondary | ICD-10-CM | POA: Diagnosis not present

## 2023-06-27 ENCOUNTER — Encounter: Payer: Self-pay | Admitting: Psychology

## 2023-06-27 ENCOUNTER — Encounter: Attending: Psychology | Admitting: Psychology

## 2023-06-27 DIAGNOSIS — R413 Other amnesia: Secondary | ICD-10-CM | POA: Insufficient documentation

## 2023-06-27 NOTE — Progress Notes (Signed)
 NEUROPSYCHOLOGICAL EVALUATION [place]  Physical Medicine and Rehabilitation     Patient: Ralph Hurst  MRN: [id number] DOB: Jun 20, 1960  Age: 63 y.o. Sex: male  Race/Ethnicity: White or Caucasian  Years of Education: 12 Handedness: Right  Collateral Information Source: Spouse Marily Shows)  Referring Provider: Debbra Fairy, MD  Provider/Clinical Neuropsychologist: Loletta Ripple, PsyD  Date of Service: 06/27/2023 Start Time: 8 AM End Time: 10 AM  Location of Service:  [facility] Physical Medicine & Rehabilitation Department [place] [address] [place] [place], [place] [address] Phone: [phone]  Billing Code/Service:            96116/96121  Individuals Present: Patient was seen, accompanied by his wife with his permission, in-person, by the provider and the providers outpatient office. 1 hour and 15 minutes spent in face-to-face clinical interview and remaining 45 minutes was spent in record review, documentation, and testing protocol construction.    PATIENT CONSENT AND CONFIDENTIALITY The patient's understanding of the reason for referral was intact. Discussed limits of confidentiality including, but not limited to, posting of final evaluation report in the patient's electronic medical record for both the patient and for the referring provider and appropriate medical professionals. Patient was given the opportunity to have their questions answered. The neuropsychological evaluation process was discussed with the patient and they consented to proceed with the evaluation.  Consent for Evaluation and Treatment: Signed: Yes Explanation of Privacy Policies: Signed: Yes Discussion of Confidentiality Limits: Yes  REASON FOR REFERRAL:The patient is a 63 year old right-handed man with medical history of severe obstructive sleep apnea, hypertension, anemia, polycythemia, diabetes, vitamin B-12 deficiency, obesity, and  polycythemia, who was referred for neuropsychological evaluation due to concerns for memory loss and other cognitive changes over the last 2 years. Notes from his March 2025 visit with Dr. Omar Bibber note the patient has been using autoPAP reliably since receiving it, vitamin B12 deficiency required management, reductions/abstinence from alcohol consumption was recommended.  MRI of the brain in December 2024 was reportedly unremarkable.  Upon interview, the patient and his spouse indicated desire to understand the cause of the patient's cognitive changes and what can be done to address it.  The patient has initiated the process of applying for disability.  The writer clarified that the purpose of this evaluation was for clinical purposes rather than forensic and the patient and collateral expressed their understanding and desire to proceed with the evaluation for clinical purposes.  HISTORY OF PRESENTING CONCERNS: The following was obtained from the patient via clinical interview.  Collateral information was provided by the patient's wife during the interview, with the patient's permission.  The patient primarily deferred to his wife and descriptions of cognitive complaints, although his reports, when provided, did not differ significantly from hers.  Cognitive Symptom Onset & Course: Cognitive changes reportedly gradual in onset but steadily progressive in course.  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  No significant or notable precipitating events were reported around the time symptoms became evident. Improvements in energy were reported upon having started the autoPAP and receiving the B12 injections, but no significant changes in cognitive symptoms were reported.  Current Cognitive Complaints:  Memory: Endorsed difficulties with short-term memory.  Endorsed problems with remembering conversations.  Denied significant difficulties with remembering  recent events, frequently misplacing things, unintentionally repeating statements/questions, remembering medications, or driving. Processing Speed: Endorsed slight declines in processing speed. Attention & Concentration:  Endorsed frequently losing train of thought.Denied significant difficulties with basic attention and sustained attention.  Language: Endorsed mild word-finding difficulties.  Endorsed difficulties with mental arithmetic and keeping track of details (when adding up receipts, working on the voices).  Difficulties with reported language comprehension appear likely to be secondary to inattention rather than difficulties with receptive language itself.  Some difficulties with reading, although problems appear to reflect an attention/poor encoding (needing to reread something several times in order to retain it). Writing via text-messaging is reportedly much more abbreviated and "cryptic" but the patient is able to clarify/elaborate when required to do so.  Denied paraphasic errors. Visual-Spatial: No clear indications of deficits in visual-spatial abilities.  Drives without issues.  Is able to assemble things.  Executive Functioning: No significant changes in planning and organization.  The patient described intact problem-solving abilities although collateral indicated some concerns in that respect.  No significant concerns were reported with respect to marked changes in judgment or impulsivity.  Behavior changes were primarily related to reduced frustration tolerance.    Motor/Sensory Complaints:   Sensory changes: No changes in sense of smell or taste.  No changes in hearing.  Some progressive changes in vision, possibly due to diabetes, and patient is scheduled with a medical provider in September of this year to evaluate. Balance/coordination difficulties: No significant changes or problems in balance or coordination. Frequent instances of dizziness/vertigo: Recent history of  lightheadedness which improved substantially following changes in blood pressure management. Other motor difficulties: No tremor.  No changes in handwriting.  Emotional and Behavioral Functioning:  Depression: Denied. Anxiety: Denied. Other: Denied current or past suicidal ideation, homicidal ideation, paranoia, hallucinations, or other significant mental health difficulties.  Denied any current or past psychiatric treatment. Sleep: Reportedly improved since starting with autoPAP.  No difficulties with sleep onset.  No difficulties returning to sleep upon waking.  There was one instance of confusion at night that appeared to be related to alcohol intoxication during medication 1 year ago.  Around 6 months ago there was sleepwalking that reportedly ceased around 2 months ago. Denied any clear indications of dream enactment/RBDs.  Appetite: Good Caffeine: 2 cups of coffee per day Alcohol Use: Abstained from alcohol starting 2 months ago. Reported 1 bottle of 80 proof liquor per two weeks and two beers per night (= ~4 drinks per night) Tobacco Use: Currently working to quit smoking. Pack a day smoker for many years. Recreational Substance Use: Denied   Level of Functional Independence: The patient is intact with basic activities of daily living.  Finances: Wife has taken over management of finances due to cognitive difficulties. Shopping / Meal Preparation: Household Maintenance / Chores: Therapist, nutritional / Future Obligations: Medication Management: Intact Driving: Intact  Medical History/Record Review: Per records and based on information obtained during interview: History of traumatic brain injury/concussion: No History of stroke: No History of heart attack: No History of cancer/chemotherapy: No History of seizure activity: No Symptoms of chronic pain: No Experience of frequent headaches/migraines: No  Past Medical History:  Diagnosis Date   Diabetes mellitus without  complication (HCC)    Essential hypertension 09/14/2006   Qualifier: Diagnosis of   By: Ena Harries, RN, Willetta Harpin       Hyperlipidemia    Hypertension    Need for immunization against influenza 10/11/2017   Polycythemia    Polycythemia, secondary 09/14/2006   Lab Results      Component    Value    Date/Time  HGB    15.9    05/19/2023 09:56 AM           HGB    15.3    12/01/2022 09:03 AM           HGB    14.9    05/25/2022 08:30 AM           HGB    17.4 (H)    10/11/2017 10:38 AM           HGB    17.2 (H)    06/10/2016 09:11 AM  Takes 2 baby aspirin  daily     Secondary Polycythemia (D75.1) #ChronicCondition #OSARelated  STATUS: 05/20/2023: Long   Routine general medical examination at a health care facility 10/11/2017   Sleep stage dysfunction 10/17/2016   Patient Active Problem List   Diagnosis Date Noted   Vitamin D  deficiency 06/19/2023   Long-term current use of injectable noninsulin antidiabetic medication 06/19/2023   Macrocytosis associated with alcohol 06/19/2023   Dizziness 06/19/2023   Complex renal cyst 06/04/2023   Kidney stone 06/04/2023   Hepatic steatosis 06/04/2023   Pernicious anemia 05/23/2023   Cerebral ischemia 05/20/2023   Persistent cognitive impairment, vascular and nutritional, disabling 05/20/2023   Allergic rhinitis 03/09/2023   Tick bite of abdomen 01/19/2023   OSA (obstructive sleep apnea) 12/01/2022   Memory loss 12/01/2022   Subacute combined degeneration (HCC) 12/01/2022   Macrocytic anemia 12/01/2022   Obesity due to energy imbalance 12/01/2022   Type 2 diabetes with stage 3 chronic kidney disease GFR 30-59 (HCC) 10/17/2016   Hypertension associated with diabetes (HCC) 10/17/2016   Hyperlipidemia 10/25/2007   Arthralgia 06/26/2007   Tobacco use disorder 09/14/2006   Imaging/Lab Results:  12/24/2022 MRI brain W/WO contrast EXAM: MRI HEAD WITHOUT AND WITH CONTRAST   FINDINGS: Brain: No restricted diffusion to suggest acute or subacute  infarct. No abnormal parenchymal or meningeal enhancement.   No acute hemorrhage, mass, mass effect, or midline shift. No hydrocephalus or extra-axial collection. Craniocervical junction within normal limits.   No hemosiderin deposition to suggest remote hemorrhage. No superficial siderosis. Normal cerebral volume for age. No disproportionate lobar atrophy. Scattered T2 hyperintense signal in the periventricular white matter and pons, likely the sequela of moderate chronic small vessel ischemic disease. Dilated perivascular spaces in the basal ganglia.   Vascular: Normal arterial flow voids. Normal arterial and venous enhancement.   Skull and upper cervical spine: Normal marrow signal.   Sinuses/Orbits: Mild mucosal thickening in the right maxillary sinus and ethmoid air cells. No acute finding in the orbits.   Other: The mastoid air cells are well aerated.   IMPRESSION: No acute intracranial process. No etiology is seen for the patient's memory loss.   Family Neurologic/Medical Hx: No known family history of dementia. Family History  Problem Relation Age of Onset   Kidney disease Mother    Hyperlipidemia Mother    Hearing loss Mother    COPD Mother    Hypertension Mother    Diabetes Mother    Asthma Mother    Sleep apnea Mother    Sleep apnea Father    Hyperlipidemia Father    Asthma Father    Hypertension Father    Hypertension Sister    Hyperlipidemia Sister    Diabetes Sister    Hypertension Brother    Hearing loss Brother    COPD Brother    Sleep apnea Son        Medications:    Academic/Vocational History: Highest level  of educational attainment: High school History of grade repetition: Second grade 1x Enrollment in special education courses: None History of LD/ADHD: Denied indications of ADHD symptomology.  Endorsed some difficulties with reading but was unable to recall the nature of those difficulties.  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  Employment: Conservator, museum/gallery, Event organiser.  Continues to work smaller jobs.  There has been a decline in the amount of work/customers that he has been receiving.  He also noted that he does take smaller jobs now as he feels less comfortable doing bigger tasks or climbing very tall ladders.  His wife indicated that she suspects some of the decline in customers was related to his difficulties.  No significant complaints were reported however.  Psychosocial: Marital Status: 39 years married. Children/Grandchildren: Two adult children, one grandchild and one expected. Living Situation: Lives with spouse. Daily Activities/Hobbies: Hunts during about half the year.  Spends time building things and occasionally golfing.  Mental Status/Behavioral Observations: The patient was seen on an outpatient basis in the [facility] PM&R office for the clinical interview accompanied by his spouse with his permission. Sensorium/Arousal: Alert.  Hearing and vision grossly intact. Appearance: Appropriate dress and hygiene. Behavior: Attentive and cooperative. Speech/Language: Conversational speech was prosodic, fluent, and well-articulated.  No frank word-finding difficulties were noted, behaviorally.  No difficulties with receptive language were noted within the context of casual conversation. Motor: Unremarkable. Social Comportment: Appropriate Mood/Affect: Some minor indications of frustration but mood was the largely euthymic and affect was congruent. Thought Process/Content: Coherent, linear, goal directed. Ability to Participate in Interview: Largely deferred to his wife for responses.  Patient initially showed slight irritation at the examiners questioning directed to him, but this was reduced when the examiner explained that, despite the memory difficulties, it is helpful for the examiner to  understand what the patient is able to remember rather than directing all questions to his wife.   SUMMARY / CLINICAL IMPRESSIONS The patient is a 63 year old right-handed man with medical history of severe obstructive sleep apnea, hypertension, anemia, polycythemia, diabetes, vitamin B-12 deficiency, obesity, and polycythemia, who was referred for neuropsychological evaluation due to concerns for memory loss and other cognitive changes over the last 2 years. Notes from his March 2025 visit with Dr. Omar Bibber note the patient has been using autoPAP reliably since receiving it, vitamin B12 deficiency required management, reductions/abstinence from alcohol consumption was recommended.  MRI of the brain in December 2024 was reportedly unremarkable. Upon interview, the patient and his spouse indicated desire to understand the cause of the patient's cognitive changes and what can be done to address it.  The patient has initiated the process of applying for disability.  The writer clarified that the purpose of this evaluation was for clinical purposes rather than forensic and the patient and collateral expressed their understanding and desire to proceed with the evaluation for clinical purposes.  The patient and collateral described cognitive changes occurring over the last 2 years with a progressive course following a gradual onset.  Symptoms involve short-term memory, some difficulties with mental arithmetic, and descriptions suggestive of interference from inattention.  The patient has had some functional declines primarily related to managing finances and bookkeeping.  There have also reportedly been changes in his work to some degree. The patient has cardiovascular risk factors and is receiving treatment for vitamin B12 deficiency. He began to  use autoPAP this year and has reportedly had benefits. He denies any significant psychiatric symptoms. Primary emotional/behavioral changes involve reduced frustration  tolerance, frequently within the context of cognitive struggles. Primary stressors at this time involve health and finances. Neurocognitive testing will provide clarity regarding the patient's cognitive profile evaluation for signs of unidentified neurocognitive pathology. Similarly, testing will provide insight into the aspects of cognition most likely to under reported symptoms, which is necessary for identifying any patterns aligning with particular etiologies.   DISPOSITION / PLAN  The patient has been set up for a formal neuropsychological assessment to objectively assess her cognitive functioning across domains to establish the patient's cognitive profile. This data, in conjunction with information obtained via clinical interview and medical record review, will help clarify likely etiology and guide treatment recommendations. Once data collection and interpretation have been completed, the findings / diagnosis and recommendations will be reviewed and discussed with the patient during a feedback appointment with the neuropsychologist. Based on the collaborative dialogue with the patient during the feedback, recommendations may be adjusted / tailored as needed. A formal report will be produced and provided to the patient and the referring provider.   Diagnosis: Memory Loss Cognitive Changes    This report was generated using voice recognition software. While this document has been carefully reviewed, transcription errors may be present. I apologize in advance for any inconvenience. Please contact me if further clarification is needed.             Loletta Ripple, PsyD             Neuropsychologist

## 2023-06-28 ENCOUNTER — Encounter: Payer: Self-pay | Admitting: Internal Medicine

## 2023-06-30 ENCOUNTER — Other Ambulatory Visit

## 2023-07-02 ENCOUNTER — Other Ambulatory Visit: Payer: Self-pay | Admitting: Internal Medicine

## 2023-07-02 DIAGNOSIS — E785 Hyperlipidemia, unspecified: Secondary | ICD-10-CM

## 2023-07-05 ENCOUNTER — Encounter: Payer: Self-pay | Admitting: Internal Medicine

## 2023-07-05 ENCOUNTER — Ambulatory Visit: Admitting: Internal Medicine

## 2023-07-05 VITALS — BP 106/68 | HR 81 | Temp 98.0°F | Ht 70.0 in | Wt 215.0 lb

## 2023-07-05 DIAGNOSIS — N179 Acute kidney failure, unspecified: Secondary | ICD-10-CM | POA: Diagnosis not present

## 2023-07-05 DIAGNOSIS — F17200 Nicotine dependence, unspecified, uncomplicated: Secondary | ICD-10-CM

## 2023-07-05 DIAGNOSIS — N183 Chronic kidney disease, stage 3 unspecified: Secondary | ICD-10-CM | POA: Diagnosis not present

## 2023-07-05 DIAGNOSIS — N281 Cyst of kidney, acquired: Secondary | ICD-10-CM | POA: Diagnosis not present

## 2023-07-05 LAB — MICROALBUMIN / CREATININE URINE RATIO
Creatinine,U: 113.2 mg/dL
Microalb Creat Ratio: UNDETERMINED mg/g (ref 0.0–30.0)
Microalb, Ur: <0.7 mg/dL

## 2023-07-05 MED ORDER — BUPROPION HCL ER (SR) 100 MG PO TB12: 100.0000 mg | ORAL_TABLET | Freq: Every morning | ORAL | 3 refills | Status: AC

## 2023-07-05 NOTE — Assessment & Plan Note (Signed)
 He is attempting to quit smoking, currently not inhaling but holding cigarettes, with oral and hand fixations present. Smoking cessation is critical to reduce kidney cancer risk and protect kidney function. He prefers to avoid nicotine replacement therapy and is considering sugar-free gum. Wellbutrin is prescribed, and a potential referral to a smoking cessation coach is discussed. Provide a handout on smoking cessation strategies. Recommend sugar-free gum, hard candies, toothpicks, straws, and holding a pen or pencil for oral and hand fixations. Encourage physical activities like walking and stretching. Discuss mindfulness and thought replacement techniques.

## 2023-07-05 NOTE — Progress Notes (Signed)
 ==============================  [facility]: [phone]   -- Medical Office Visit --  Patient: Ralph Hurst      Age: 63 y.o.       Sex:  male  Date:   07/05/2023 Today's Healthcare Provider: Anthon Kins, MD  ==============================   Chief Complaint: Chronic Kidney Disease (Pt did not have scan done waiting on insurance to approval.pt has appt with urology June 11th in [place]. Also stated that he does not have appt with Nephrology til 2 to 4 weeks out they review his chart and stated he is 3 is what wife stated. Pt is trying his best to stop smoking.)  Main reason for close follow up today  is the recent dropoff in GFR in association with new kidney lesion. Lab Results  Component Value Date   GFR 46.24 (L) 06/19/2023   GFR 54.90 (L) 05/19/2023   GFR 53.21 (L) 12/01/2022   GFR 61.71 05/25/2022   GFR 87.64 10/11/2017    Discussed the use of AI scribe software for clinical note transcription with the patient, who gave verbal consent to proceed.  CALL REFERRAL COORDINTATOR TO MOVE History of Present Illness  63 year old male with chronic kidney disease who presents for a kidney follow-up.  His kidney function has progressively declined over the years, with a decrease from 87% in 2019 to 61% in 2024, and most recently from 54% to 46%. He is awaiting further imaging to assess a renal cyst, which has been delayed due to insurance authorization issues. He has an upcoming appointment with a urologist and nephrologist for this issue:   He has a history of smoking and is actively working on cessation. He has nearly quit smoking and is employing strategies such as holding the cigarette without inhaling and considering sugar-free gum to manage oral fixation. He wants to eliminate nicotine entirely from his system and prefers not to use nicotine replacement products.   I fully support his claim for  disability but he did not bring  paperwork today. He has intellectual disability in my medical opinion likely secondary to B12 deficiency and obstructive sleep apnea possibly additional medical issues.  He is currently taking B12 injections and using a CPAP machine. He is scheduled for cognitive testing with a neuropsychologist and is in the process of completing disability paperwork.    Background Reviewed: Problem List: has Hyperlipidemia; Tobacco use disorder; Arthralgia; Type 2 diabetes with stage 3 chronic kidney disease GFR 30-59 (HCC); Hypertension associated with diabetes (HCC); OSA (obstructive sleep apnea); Memory loss; Subacute combined degeneration (HCC); Macrocytic anemia; Obesity due to energy imbalance; Tick bite of abdomen; Allergic rhinitis; Cerebral ischemia; Persistent cognitive impairment, vascular and nutritional, disabling; Pernicious anemia; Complex renal cyst; Kidney stone; Hepatic steatosis; Vitamin D  deficiency; Long-term current use of injectable noninsulin antidiabetic medication; Macrocytosis associated with alcohol; and Dizziness on their problem list. Past Medical History:  has a past medical history of Diabetes mellitus without complication (HCC), Essential hypertension (09/14/2006), Hyperlipidemia, Hypertension, Need for immunization against influenza (10/11/2017), Polycythemia, Polycythemia, secondary (09/14/2006), Routine general medical examination at a health care facility (10/11/2017), and Sleep stage dysfunction (10/17/2016). Past Surgical History:   has no past surgical history on file. Social History:   reports that he has been smoking cigarettes. He has never used smokeless tobacco. He reports current alcohol use of about 12.0 standard drinks of alcohol per week. He reports that he does not use drugs. Family History:  family history includes Asthma in  his father and mother; COPD in his brother and mother; Diabetes in his mother and sister; Hearing loss in his brother and mother; Hyperlipidemia in  his father, mother, and sister; Hypertension in his brother, father, mother, and sister; Kidney disease in his mother; Sleep apnea in his father, mother, and son. Allergies:  has no known allergies.   Medication Reconciliation: Current Outpatient Medications on File Prior to Visit  Medication Sig   aspirin  EC 81 MG tablet Take 1 tablet (81 mg total) by mouth daily.   atenolol  (TENORMIN ) 100 MG tablet Take 1 tablet (100 mg total) by mouth daily.   Blood Glucose Monitoring Suppl DEVI 1 each by Does not apply route in the morning, at noon, and at bedtime. May substitute to any manufacturer covered by patient's insurance.   Cholecalciferol (VITAMIN D3) 50 MCG (2000 UT) capsule Take 1 capsule (2,000 Units total) by mouth daily.   Cyanocobalamin  (B-12) 1000 MCG SUBL Place 1 tablet under the tongue daily at 6 (six) AM.   empagliflozin  (JARDIANCE ) 10 MG TABS tablet Take 1 tablet (10 mg total) by mouth daily before breakfast.   fexofenadine (ALLEGRA ALLERGY) 180 MG tablet Take 180 mg by mouth daily.   Lancets (ONETOUCH ULTRASOFT) lancets Use once daily. Dx E11.9   losartan  (COZAAR ) 100 MG tablet Take 1 tablet (100 mg total) by mouth daily.   NEEDLE, DISP, 25 G (B-D DISP NEEDLE 25GX1") 25G X 1" MISC Use to give with B 12   Omega-3 Fatty Acids (FISH OIL) 1200 MG CAPS Take by mouth.   rosuvastatin  (CRESTOR ) 10 MG tablet Take 1 tablet by mouth once daily   Semaglutide ,0.25 or 0.5MG /DOS, (OZEMPIC , 0.25 OR 0.5 MG/DOSE,) 2 MG/3ML SOPN Inject 0.5 mg into the skin once a week.   spironolactone  (ALDACTONE ) 50 MG tablet Take 1 tablet (50 mg total) by mouth daily.   SYRINGE-NEEDLE, DISP, 3 ML (LUER LOCK SAFETY SYRINGES) 25G X 1" 3 ML MISC Use to take B 12   Current Facility-Administered Medications on File Prior to Visit  Medication   cyanocobalamin  (VITAMIN B12) injection 1,000 mcg   cyanocobalamin  (VITAMIN B12) injection 1,000 mcg  There are no discontinued medications.   Physical Exam:    07/05/2023     8:53 AM 06/19/2023   10:49 AM 06/19/2023   10:41 AM  Vitals with BMI  Height 5\' 10"   5\' 10"   Weight 215 lbs  219 lbs 6 oz  BMI 30.85  31.48  Systolic 106 93 98  Diastolic 68 60 68  Pulse 81  68  Vital signs reviewed.  Nursing notes reviewed. Weight trend reviewed. Physical Exam General Appearance:  No acute distress appreciable.   Well-groomed, healthy-appearing male.  Well proportioned with no abnormal fat distribution.  Good muscle tone. Pulmonary:  Normal work of breathing at rest, no respiratory distress apparent. SpO2: 98 %  Musculoskeletal: All extremities are intact.  Neurological:  Awake, alert, oriented, and engaged.  No obvious focal neurological deficits or cognitive impairments.  Sensorium seems unclouded.   Speech is clear and coherent with logical content. Psychiatric:  Appropriate mood, pleasant and cooperative demeanor, thoughtful and engaged during the exam     Results for orders placed or performed in visit on 07/05/23  Microalbumin / creatinine urine ratio  Result Value Ref Range   Microalb, Ur <0.7 mg/dL   Creatinine,U 161.0 mg/dL   Microalb Creat Ratio Unable to calculate 0.0 - 30.0 mg/g   Office Visit on 07/05/2023  Component Date Value  Microalb, Ur 07/05/2023 <0.7    Creatinine,U 07/05/2023 113.2    Microalb Creat Ratio 07/05/2023 Unable to calculate   Office Visit on 06/19/2023  Component Date Value   Creatinine, Urine 06/19/2023 106    Protein/Creat Ratio 06/19/2023 104    Protein/Creatinine Ratio 06/19/2023 0.104    Total Protein, Urine 06/19/2023 11    Color, Urine 06/19/2023 YELLOW    APPearance 06/19/2023 CLEAR    Specific Gravity, Urine 06/19/2023 1.026    pH 06/19/2023 5.5    Glucose, UA 06/19/2023 3+ (A)    Bilirubin Urine 06/19/2023 NEGATIVE    Ketones, ur 06/19/2023 NEGATIVE    Hgb urine dipstick 06/19/2023 NEGATIVE    Protein, ur 06/19/2023 NEGATIVE    Nitrites, Initial 06/19/2023 NEGATIVE    Leukocyte Esterase 06/19/2023 NEGATIVE     WBC, UA 06/19/2023 NONE SEEN    RBC / HPF 06/19/2023 NONE SEEN    Squamous Epithelial / HPF 06/19/2023 NONE SEEN    Bacteria, UA 06/19/2023 NONE SEEN    Hyaline Cast 06/19/2023 NONE SEEN    Note 06/19/2023     Sodium 06/19/2023 135    Potassium 06/19/2023 4.2    Chloride 06/19/2023 103    CO2 06/19/2023 25    Glucose, Bld 06/19/2023 115 (H)    BUN 06/19/2023 23    Creatinine, Ser 06/19/2023 1.58 (H)    GFR 06/19/2023 46.24 (L)    Calcium  06/19/2023 9.3    Microalb, Ur 06/19/2023 <0.7    Creatinine,U 06/19/2023 105.6    Microalb Creat Ratio 06/19/2023 Unable to calculate    Reflexve Urine Culture 06/19/2023    Office Visit on 05/19/2023  Component Date Value   WBC 05/19/2023 7.5    RBC 05/19/2023 4.69    Hemoglobin 05/19/2023 15.9    HCT 05/19/2023 45.9    MCV 05/19/2023 97.8    MCHC 05/19/2023 34.7    RDW 05/19/2023 11.8    Platelets 05/19/2023 221.0    Neutrophils Relative % 05/19/2023 70.5    Lymphocytes Relative 05/19/2023 18.9    Monocytes Relative 05/19/2023 6.4    Eosinophils Relative 05/19/2023 3.4    Basophils Relative 05/19/2023 0.8    Neutro Abs 05/19/2023 5.3    Lymphs Abs 05/19/2023 1.4    Monocytes Absolute 05/19/2023 0.5    Eosinophils Absolute 05/19/2023 0.3    Basophils Absolute 05/19/2023 0.1    Sodium 05/19/2023 137    Potassium 05/19/2023 4.7    Chloride 05/19/2023 101    CO2 05/19/2023 28    Glucose, Bld 05/19/2023 176 (H)    BUN 05/19/2023 20    Creatinine, Ser 05/19/2023 1.37    Total Bilirubin 05/19/2023 0.5    Alkaline Phosphatase 05/19/2023 55    AST 05/19/2023 18    ALT 05/19/2023 25    Total Protein 05/19/2023 6.6    Albumin 05/19/2023 4.5    GFR 05/19/2023 54.90 (L)    Calcium  05/19/2023 9.6    Cholesterol 05/19/2023 91    Triglycerides 05/19/2023 275.0 (H)    HDL 05/19/2023 25.60 (L)    VLDL 05/19/2023 55.0 (H)    LDL Cholesterol 05/19/2023 11    Total CHOL/HDL Ratio 05/19/2023 4    NonHDL 05/19/2023 65.55    Hgb A1c MFr Bld  05/19/2023 6.8 (H)    Microalb, Ur 05/19/2023 <0.7    Creatinine,U 05/19/2023 146.0    Microalb Creat Ratio 05/19/2023 Unable to calculate    Vitamin B-12 05/19/2023 >1537 (H)    Folate 05/19/2023 14.0  Intrinsic Factor 05/19/2023 Positive (A)    VITD 05/19/2023 23.09 (L)   Office Visit on 12/01/2022  Component Date Value   Microalb, Ur 12/01/2022 <0.7    Creatinine,U 12/01/2022 111.6    Microalb Creat Ratio 12/01/2022 0.6    Sodium 12/01/2022 136    Potassium 12/01/2022 4.1    Chloride 12/01/2022 100    CO2 12/01/2022 28    Glucose, Bld 12/01/2022 264 (H)    BUN 12/01/2022 18    Creatinine, Ser 12/01/2022 1.41    Total Bilirubin 12/01/2022 0.6    Alkaline Phosphatase 12/01/2022 53    AST 12/01/2022 18    ALT 12/01/2022 21    Total Protein 12/01/2022 6.7    Albumin 12/01/2022 4.2    GFR 12/01/2022 53.21 (L)    Calcium  12/01/2022 9.4    Hgb A1c MFr Bld 12/01/2022 6.8 (H)    WBC 12/01/2022 6.1    RBC 12/01/2022 4.53    Platelets 12/01/2022 197.0    Hemoglobin 12/01/2022 15.3    HCT 12/01/2022 45.9    MCV 12/01/2022 101.4 (H)    MCHC 12/01/2022 33.4    RDW 12/01/2022 12.5    Vitamin B-12 12/01/2022 210 (L)   No image results found. US  Renal Result Date: 06/03/2023 CLINICAL DATA:  ckd3, fh kidney dz EXAM: RENAL / URINARY TRACT ULTRASOUND COMPLETE COMPARISON:  None Available. FINDINGS: Right Kidney: Renal measurements: 12.2 x 5.9 x 4.5 cm = volume: 170 mL. Echogenicity within normal limits. No hydronephrosis visualized. In the superior pole of the RIGHT kidney, there is an anechoic mass with several prominent internal septations which measures 3.4 x 2.3 x 2.5 cm. Echogenic focus with posterior acoustic shadowing in the inferior pole measuring up to 5 mm. Left Kidney: Renal measurements: 11.8 x 6.1 x 5.2 cm = volume: 195 mL. Echogenicity within normal limits. No mass or hydronephrosis visualized. Bladder: Appears normal for degree of bladder distention. Other: Increased hepatic  echogenicity. IMPRESSION: 1. There is a 3.4 cm septated cyst in the superior pole of the RIGHT kidney. This likely reflects a mildly complicated cyst. Recommend further evaluation with dedicated renal protocol CT or MRI with and without contrast. 2. Nonobstructive RIGHT nephrolithiasis versus vascular calcification. 3. Increased hepatic echogenicity as can be seen in hepatic steatosis. These results will be called to the ordering clinician or representative by the Radiologist Assistant, and communication documented in the PACS or [employer]. Electronically Signed   By: Clancy Crimes M.D.   On: 06/03/2023 11:18        02/14/2023   10:49 AM 01/19/2023   11:05 AM 12/12/2022    8:59 AM 12/01/2022    8:28 AM  PHQ 2/9 Scores  PHQ - 2 Score 0 0 0 0  PHQ- 9 Score 2   0   Results LABS Kidney function: 87% (2019) Kidney function: 61% (2024) Kidney function: 54% (05/2023) Kidney function: 46% (06/2023)    Assessment & Plan Complex renal cyst The complex renal cyst may be contributing to chronic kidney disease. Imaging approval is pending for further evaluation, which is necessary for urology to decide on interventions like biopsy or surgery. Expedite imaging approval through the referral coordinator and provide an educational handout on kidney cysts.The complex renal cyst poses a risk for kidney cancer. Smoking cessation is crucial to reduce the risk of cancer progression. Encourage smoking cessation. Chronic kidney disease (CKD), active medical management without dialysis, stage 3 (moderate) (HCC) Chronic kidney disease with a decline in kidney function from 87%  in 2019 to 46% currently. This may be due to a lab artifact or ongoing kidney damage, with a complex renal cyst possibly contributing. A nephrology appointment is pending but not urgent. Order Cystatin C, microalbumin, and a basic metabolic panel. Recheck blood and urine, and follow up based on lab results. Tobacco use disorder He is  attempting to quit smoking, currently not inhaling but holding cigarettes, with oral and hand fixations present. Smoking cessation is critical to reduce kidney cancer risk and protect kidney function. He prefers to avoid nicotine replacement therapy and is considering sugar-free gum. Wellbutrin is prescribed, and a potential referral to a smoking cessation coach is discussed. Provide a handout on smoking cessation strategies. Recommend sugar-free gum, hard candies, toothpicks, straws, and holding a pen or pencil for oral and hand fixations. Encourage physical activities like walking and stretching. Discuss mindfulness and thought replacement techniques. Acute kidney injury (HCC) Chronic kidney disease with a decline in kidney function from 87% in 2019 to 46% currently. This may be due to a lab artifact or ongoing kidney damage, with a complex renal cyst possibly contributing. A nephrology appointment is pending but not urgent. Order Cystatin C, microalbumin, and a basic metabolic panel. Recheck blood and urine, and follow up based on lab results.       Orders Placed During this Encounter:   Orders Placed This Encounter  Procedures   BASIC METABOLIC PANEL WITHOUT eGFR   Microalbumin / creatinine urine ratio    [place]   Cystatin C with Glomerular Filtration Rate, Estimated (eGFR)   Ambulatory referral to Virtual Care Smoking Cessation    Referral Priority:   Routine    Referral Type:   Consultation    Referral Reason:   Specialty Services Required    Number of Visits Requested:   1   Meds ordered this encounter  Medications   buPROPion ER (WELLBUTRIN SR) 100 MG 12 hr tablet    Sig: Take 1 tablet (100 mg total) by mouth in the morning.    Dispense:  90 tablet    Refill:  3   Medical Decision Making: 1 or more chronic illnesses with exacerbation,  progression, or side effects of treatment 2 or more stable chronic illnesses     Ordering of each unique test; Prescription drug management        This document was synthesized by artificial intelligence ([employer]bridge) using HIPAA-compliant recording of the clinical interaction;   We discussed the use of AI scribe software for clinical note transcription with the patient, who gave verbal consent to proceed. additional Info: This encounter employed state-of-the-art, real-time, collaborative documentation. The patient actively reviewed and assisted in updating their electronic medical record on a shared screen, ensuring transparency and facilitating joint problem-solving for the problem list, overview, and plan. This approach promotes accurate, informed care. The treatment plan was discussed and reviewed in detail, including medication safety, potential side effects, and all patient questions. We confirmed understanding and comfort with the plan. Follow-up instructions were established, including contacting the office for any concerns, returning if symptoms worsen, persist, or new symptoms develop, and precautions for potential emergency department visits.

## 2023-07-05 NOTE — Patient Instructions (Addendum)
 Quitting smoking guidance:  When a patient exhibits behaviors such as holding a cigarette in their mouth or hand and then discarding it during smoking cessation, it's important to recognize that these actions may be manifestations of oral and hand fixations associated with the smoking habit. Addressing these behaviors effectively can significantly aid in the cessation process. Understanding the Behavior The act of holding a cigarette, even without smoking it, can be a deeply ingrained habit for many smokers. This behavior often stems from the need to satisfy oral and hand fixations developed over years of smoking. Such actions can serve as a coping mechanism to manage cravings and the habitual nature of smoking. Recommended Strategies Substitute with Oral and Hand Alternatives Encourage the patient to replace the cigarette with other items that can mimic the hand-to-mouth motion. Options include sugarless gum, hard candies, toothpicks, straws, or even holding a pen or pencil. These substitutes can help satisfy the oral and hand fixations without resorting to smoking.  Engage in Physical Activities Physical activities such as walking, stretching, or other forms of exercise can serve as effective distractions. They not only divert attention from the urge to smoke but also release endorphins, which can improve mood and reduce stress.  Practice Mindfulness and Relaxation Techniques Mindfulness practices, including deep breathing exercises and meditation, can help the patient become more aware of their cravings and manage them effectively. These techniques promote relaxation and can reduce the intensity of the urge to smoke.  Modify the Environment Advise the patient to remove all smoking-related items, such as cigarettes, lighters, and ashtrays, from their immediate environment. Creating a smoke-free space can reduce triggers and temptations, making it easier to resist the urge to smoke.  Utilize  Behavioral Therapy Techniques Implementing behavioral strategies like stimulus control can be beneficial. This involves identifying and avoiding situations that trigger the urge to smoke and replacing the smoking behavior with healthier alternatives. Additionally, cognitive-behavioral therapy (CBT) can help address the underlying psychological aspects of smoking addiction.  Provide Support and Encouragement Regular follow-up and encouragement are crucial. Reassure the patient that setbacks are common and that persistence is key. Celebrate small victories and progress to maintain motivation.  Conclusion The behavior of holding a cigarette without smoking it is a common challenge during smoking cessation. By implementing strategies that address the underlying habits and providing continuous support, patients can effectively manage these behaviors and increase their chances of successfully quitting smoking.  Below are the key [facility]) forms you can download, complete, and bring to your appointment. These are all fillable PDFs--you can type directly into them or print and hand-write.  I prefer that you type them and refine your answer to be as accurate as possible using artificial intelligence to help guide you to the most accurate responses. 1. Adult Disability Starter Kit (Checklist & Worksheets) A concise "starter kit" to help gather everything needed before applying. It includes: A checklist of documents (medical records, work history, IDs) An optional Medical & Job Worksheet to organize details in advance A fact sheet on the application process Download here: (Social Security) 2. Form SSA-3368-BK: Disability Report - Adult This is the core "Disability Report" where the claimant describes their medical conditions, treatments, how daily activities are limited, and onset dates. It drives the state Disability Determination Service's evaluation. Download the fillable PDF:  (Social Security) 3. Form SSA-3369-BK: Work History Report Details past jobs, duties, hours, and earnings--critical for assessing "substantial gainful activity." If he's worked since his alleged onset date, this  must be completed. Download here: (Tree surgeon) 4. Form SSA-827: Authorization to [employer] SSA permission to obtain his medical records from hospitals, clinics, and other providers. Staff cannot pull any PHI without this signed release. Download here: (Tree surgeon)   How to Use These Forms Fill in as much as possible before the visit--dates, names of providers, job duties, Lexicographer. Bring the completed PDFs (or printed copies) to your office on appointment day. You can review together any sections he's unsure about and you can clarify or add clinical details. After your appointment, he (or your office) can submit them to the local SSA field office (find by ZIP at [email or web address] or upload via his my Social Security account if he's set that up. Having these forms mostly pre-filled will make your visit far smoother and ensure all necessary legal paperwork is ready for your supporting statement.

## 2023-07-05 NOTE — Assessment & Plan Note (Signed)
 The complex renal cyst may be contributing to chronic kidney disease. Imaging approval is pending for further evaluation, which is necessary for urology to decide on interventions like biopsy or surgery. Expedite imaging approval through the referral coordinator and provide an educational handout on kidney cysts.The complex renal cyst poses a risk for kidney cancer. Smoking cessation is crucial to reduce the risk of cancer progression. Encourage smoking cessation.

## 2023-07-06 ENCOUNTER — Encounter

## 2023-07-06 ENCOUNTER — Ambulatory Visit: Payer: Self-pay | Admitting: Internal Medicine

## 2023-07-06 DIAGNOSIS — N179 Acute kidney failure, unspecified: Secondary | ICD-10-CM | POA: Insufficient documentation

## 2023-07-06 DIAGNOSIS — R413 Other amnesia: Secondary | ICD-10-CM

## 2023-07-06 DIAGNOSIS — E1122 Type 2 diabetes mellitus with diabetic chronic kidney disease: Secondary | ICD-10-CM

## 2023-07-06 DIAGNOSIS — N281 Cyst of kidney, acquired: Secondary | ICD-10-CM

## 2023-07-06 DIAGNOSIS — N1831 Chronic kidney disease, stage 3a: Secondary | ICD-10-CM | POA: Insufficient documentation

## 2023-07-06 LAB — BASIC METABOLIC PANEL WITHOUT GFR
BUN/Creatinine Ratio: 16 (calc) (ref 6–22)
BUN: 25 mg/dL (ref 7–25)
CO2: 24 mmol/L (ref 20–32)
Calcium: 10 mg/dL (ref 8.6–10.3)
Chloride: 104 mmol/L (ref 98–110)
Creat: 1.58 mg/dL — ABNORMAL HIGH (ref 0.70–1.35)
Glucose, Bld: 115 mg/dL — ABNORMAL HIGH (ref 65–99)
Potassium: 4.1 mmol/L (ref 3.5–5.3)
Sodium: 139 mmol/L (ref 135–146)

## 2023-07-06 LAB — CYSTATIN C WITH GLOMERULAR FILTRATION RATE, ESTIMATED (EGFR)
CYSTATIN C: 2.08 mg/L — ABNORMAL HIGH (ref 0.52–1.20)
eGFR: 29 mL/min/{1.73_m2} — ABNORMAL LOW (ref >=60)

## 2023-07-06 NOTE — Telephone Encounter (Signed)
 Spoke with wife about labs understood well states she will make sure husband stops jardiance  and get appt with provider in two weeks and that the referral team will try to get appt sooner with the urology with kidney MRI done.

## 2023-07-06 NOTE — Progress Notes (Signed)
 PERFORMANCE VALIDITY: The patient completed three measures with embedded performance validity metrics and two stand-alone performance validity tests. Performances on the three embedded PVT metrics were all outside of normal limits. Performance on one stand-alone PVT was outside of normal limits and metrics within the second stand-alone PVT were variable / not consistently within normal limits. Behavioral observations were notable for difficulties with frustration tolerance and history is notable for test-taking anxiety. Overall, performance validity metrics and behavioral observations raise significant concern for sub-optimal task engagement. Validity concerns are such that test performances cannot be with reasonable confidence, interpreted as a reliable estimate of the patient's cognitive capabilities. In this circumstance, performances which were scored within the average range or better can be interpreted and reported, but such performances are interpreted as low limit performance estimate.   Behavioral Observations:  The patient was oriented to self, some aspects of time (able to provide year, but not the month/day/date), and some aspects of place (was able to provide city but not exact "type" of place or name of medical organization). His hearing and vision were adequate for testing. He ambulated independently and without issue. A slight hand tremor was noted on both hands during testing. His speech was prosodic, fluent, and well-articulated. He displayed no clear indications of word-finding difficulties in conversational speech and no paraphasic errors were noted. Receptive language appeared intact. The patient's affect was congruent with mood and his mood was largely neutral to anxious. The patient was alert and participated in testing as instructed. He was mostly cooperative throughout the session. His pace was relatively steady. The patient showed some difficulties with frustration tolerance  throughout testing as demonstrated by tossing his pen on the table when experiencing difficulty with a task or attempting to "give up" before a task was completed.  Other social interactions were unremarkable and consistent with the setting. No frank attentional lapses were appreciated.  Neuropsychology Note  Ralph Hurst completed 170 minutes of neuropsychological testing with technician, Rhett Cella, BA, under the supervision of Aleene Hurry, PsyD., Clinical Neuropsychologist. The patient did not appear overtly distressed by the testing session, per behavioral observation or via self-report to the technician. Rest breaks were offered.   Clinical Decision Making: In considering the patient's current level of functioning, level of presumed impairment, nature of symptoms, emotional and behavioral responses during clinical interview, level of literacy, and observed level of motivation/effort, a battery of tests was selected by Dr. Georgeanne King during initial consultation on 06/27/2023. This was communicated to the technician. Communication between the neuropsychologist and technician was ongoing throughout the testing session and changes were made as deemed necessary based on patient performance on testing, technician observations and additional pertinent factors such as those listed above.  Tests Administered: Automatic Data Edition (BNT-2) Brief Visuospatial Memory Test-Revised (BVMT-R) Benton Judgment of Line Orientation (JOLO) Clock Drawing Test Controlled Oral Word Association Test (FAS & Animals) Orthoptist System (D-KEFS), select subtests Grooved Pegboard Test [employer] Verbal Learning Test - Revised (HVLT-R) Rey Complex Figure Test (RCFT), select subtests Trail Making Test (TMT; Part A & B) Wechsler Adult Intelligence Scale-Fourth Edition (WAIS-IV), select subtests Wechsler Memory Scale-Fourth Edition (WMS-IV) , select subtests Geriatric Depression Scale-Short  Form (GDS-SF) Geriatric Anxiety Inventory (GAI)  Results: Note: This summary of test scores accompanies the interpretive report and should not be interpreted by unqualified individuals or in isolation without reference to the report. Test scores are relative to age, gender, and educational history as available and appropriate.   Measurement properties of  test scores: IQ, Index, and Standard Scores (SS): Mean = 100; Standard Deviation = 15; Scaled Scores (ss): Mean = 10; Standard Deviation = 3; Z scores (Z): Mean = 0; Standard Deviation = 1; T scores (T); Mean = 50; Standard Deviation = 10      ATTENTION AND WORKING MEMORY    Norm Score Percentile  Range  WAIS-IV          Digit Span  ss = 3 1 %ile Exceptionally Low   DSF  ss = 7 16 %ile Low Average   Span:    5      DSB  ss = 6 9 %ile Low Average   Span:    3      DSS  ss = 2 0.4 %ile Exceptionally Low   Span:    2      PROCESSING SPEED    Norm Score Percentile  Range  WAIS-IV          Coding  ss = 3 1 %ile Exceptionally Low   Symbol Search  ss = 3 1 %ile Exceptionally Low   PSYCHOMOTION    Norm Score Percentile  Range  Grooved Pegboard - Dominant  t = 30 2 %ile Below Average   # of drops    2     Grooved Pegboard - Nondominant  t = 34 5 %ile Below Average   # of drops    0      LANGUAGE    Norm Score Percentile  Range  [place] Naming Test (BNT-2)  t = 47 37 %ile Average  COWAT          FAS  t = 26 1 %ile Exceptionally Low   Animals  t = 18 0.1 %ile Exceptionally Low   EXECUTIVE FUNCTIONING    Norm Score Percentile  Range  DKEFS - Color-Word Interference          Color Naming  ss = 1 0.1 %ile Exceptionally Low   Word Reading  ss = 1 0.1 %ile Exceptionally Low   Inhibition  ss =   %ile Discontinued (unable to complete practice trial)   Errors  ss =   %ile    Inhibition Switching  ss =   %ile Discontinued on condition 3   Errors  ss =   %ile   Trails A  t = 16 <0.1 %ile Exceptionally Low   Errors    0     Trails B  t =    %ile Discontinued (time limit reached)   Errors    0      MEMORY    Norm Score Percentile  Range  BVMT-R          Trial 1  t = 29.0 2 %ile Exceptionally Low   Trial 2  t = 22 0.2 %ile Exceptionally Low   Trial 3  t = <20            <0.1 %ile Exceptionally Low   Total Recall  t = <20 <0.1 %ile Exceptionally Low   Learning  t = 35.0 7 %ile Below Average   Delayed Recall  t = <20 <0.1 %ile Exceptionally Low   % Retained    0 <1 %ile Exceptionally Low   Hits     6-10 %ile Low to Below Average   False Alarms     <1 %ile Exceptionally Low   Recognition Discriminability     1-2 %ile Below to Exceptionally Low  HVLT          Total Recall  t = <20            <0.1 %ile Exceptionally Low   Delayed Recall  t = <20 <0.1 %ile Exceptionally Low   %Retention  t = <20 <0.1 %ile Exceptionally Low   Recognition Discriminability  t = t<=20 <=0.1 %ile Exceptionally Low  Wechsler Memory Scale, 4th Edition (WMS-4)         Log. Mem. Immediate Recall  ss = 1 0.1 %ile Exceptionally Low   Logical Memory Delayed Recall  ss = 1 0.1 %ile Exceptionally Low   Logical Recognition    3rd-9th  %ile Low to Below Average   VISUAL-SPATIAL    Norm Score Percentile  Range  WAIS-IV          Block Design  ss =   %ile Discontinued (unable to continue)   Matrix Reasoning  ss = 6 9 %ile Low Average  Benton JOLO  ss = 5 5 %ile Below Average  Rey Complex Figure Copy       <1  %ile Exceptionally Low  Clock          PERSONALITY AND BEHAVIORAL FUNCTIONING      Score/Interpretation  GDS-SF Raw       5  GDS-SF Severity       Mild.  GAI Raw       3  GAI Severity       Minimal.    Feedback to Patient: Ralph Hurst will return on 07/14/2023 for an interactive feedback session with Dr. Georgeanne King at which time his test performances, clinical impressions and treatment recommendations will be reviewed in detail. The patient understands he can contact our office should he require our assistance before this time.  170 minutes spent  face-to-face with patient administering standardized tests, 30 minutes spent scoring Radiographer, therapeutic). [CPT [id number], [id number]]  Full report to follow.

## 2023-07-07 NOTE — Progress Notes (Signed)
 Cystatin C returned confirming kidneys are doing poorly and that urgent urology/nephrology needed. Will share with referral coordinator to expedite and include in referrals.

## 2023-07-09 DIAGNOSIS — G4733 Obstructive sleep apnea (adult) (pediatric): Secondary | ICD-10-CM | POA: Diagnosis not present

## 2023-07-10 ENCOUNTER — Encounter: Payer: Self-pay | Admitting: Internal Medicine

## 2023-07-11 ENCOUNTER — Encounter: Attending: Psychology | Admitting: Psychology

## 2023-07-11 DIAGNOSIS — R4189 Other symptoms and signs involving cognitive functions and awareness: Secondary | ICD-10-CM | POA: Diagnosis not present

## 2023-07-14 ENCOUNTER — Encounter: Admitting: Psychology

## 2023-07-14 DIAGNOSIS — R4189 Other symptoms and signs involving cognitive functions and awareness: Secondary | ICD-10-CM

## 2023-07-18 ENCOUNTER — Ambulatory Visit
Admission: RE | Admit: 2023-07-18 | Discharge: 2023-07-18 | Disposition: A | Discharge status: Home / Self Care | Source: Ambulatory Visit | Attending: Internal Medicine | Admitting: Internal Medicine

## 2023-07-18 DIAGNOSIS — N2889 Other specified disorders of kidney and ureter: Secondary | ICD-10-CM

## 2023-07-18 DIAGNOSIS — N281 Cyst of kidney, acquired: Secondary | ICD-10-CM | POA: Diagnosis not present

## 2023-07-18 MED ORDER — GADOPICLENOL 0.5 MMOL/ML IV SOLN
10.0000 mL | Freq: Once | INTRAVENOUS | Status: AC | PRN
  Administered 2023-07-18: 10 mL via INTRAVENOUS

## 2023-07-19 ENCOUNTER — Ambulatory Visit: Admitting: Urology

## 2023-07-20 DIAGNOSIS — Z419 Encounter for procedure for purposes other than remedying health state, unspecified: Secondary | ICD-10-CM | POA: Diagnosis not present

## 2023-07-20 DIAGNOSIS — G4733 Obstructive sleep apnea (adult) (pediatric): Secondary | ICD-10-CM | POA: Diagnosis not present

## 2023-07-21 ENCOUNTER — Other Ambulatory Visit: Payer: Self-pay | Admitting: Internal Medicine

## 2023-07-21 DIAGNOSIS — I10 Essential (primary) hypertension: Secondary | ICD-10-CM

## 2023-07-23 NOTE — Progress Notes (Signed)
 Negative MRI reviewed. Updated problem overview for this problem to improve longitudinal management

## 2023-07-24 ENCOUNTER — Other Ambulatory Visit: Payer: Self-pay | Admitting: Internal Medicine

## 2023-07-24 ENCOUNTER — Other Ambulatory Visit (HOSPITAL_COMMUNITY): Payer: Self-pay

## 2023-07-24 ENCOUNTER — Encounter: Payer: Self-pay | Admitting: Internal Medicine

## 2023-07-24 ENCOUNTER — Telehealth: Payer: Self-pay

## 2023-07-24 DIAGNOSIS — I10 Essential (primary) hypertension: Secondary | ICD-10-CM

## 2023-07-24 NOTE — Telephone Encounter (Signed)
 Pharmacy Patient Advocate Encounter   Received notification from Onbase that prior authorization for Ozempic  (0.25 or 0.5 MG/DOSE) 2MG /3ML pen-injectors is required/requested.   Insurance verification completed.   The patient is insured through [facility] [place] .   Per test claim: Refill too soon. PA is not needed at this time. Medication was filled 05/19/23. Next eligible fill date is 07/28/23.

## 2023-07-25 ENCOUNTER — Encounter: Payer: Self-pay | Admitting: Internal Medicine

## 2023-07-25 ENCOUNTER — Ambulatory Visit (INDEPENDENT_AMBULATORY_CARE_PROVIDER_SITE_OTHER): Admitting: Internal Medicine

## 2023-07-25 VITALS — BP 118/64 | HR 71 | Temp 97.7°F | Ht 70.0 in | Wt 211.4 lb

## 2023-07-25 DIAGNOSIS — E538 Deficiency of other specified B group vitamins: Secondary | ICD-10-CM | POA: Diagnosis not present

## 2023-07-25 DIAGNOSIS — N281 Cyst of kidney, acquired: Secondary | ICD-10-CM | POA: Diagnosis not present

## 2023-07-25 DIAGNOSIS — D510 Vitamin B12 deficiency anemia due to intrinsic factor deficiency: Secondary | ICD-10-CM

## 2023-07-25 DIAGNOSIS — R2689 Other abnormalities of gait and mobility: Secondary | ICD-10-CM | POA: Insufficient documentation

## 2023-07-25 DIAGNOSIS — N183 Chronic kidney disease, stage 3 unspecified: Secondary | ICD-10-CM

## 2023-07-25 DIAGNOSIS — G320 Subacute combined degeneration of spinal cord in diseases classified elsewhere: Secondary | ICD-10-CM

## 2023-07-25 DIAGNOSIS — Z7189 Other specified counseling: Secondary | ICD-10-CM

## 2023-07-25 DIAGNOSIS — R4189 Other symptoms and signs involving cognitive functions and awareness: Secondary | ICD-10-CM | POA: Diagnosis not present

## 2023-07-25 MED ORDER — B-12 1000 MCG SL SUBL
1.0000 | SUBLINGUAL_TABLET | Freq: Every day | SUBLINGUAL | 3 refills | Status: AC
Start: 2023-07-25 — End: ?

## 2023-07-25 MED ORDER — B-12 COMPLIANCE INJECTION 1000 MCG/ML IJ KIT: 1000.0000 mg | PACK | INTRAMUSCULAR | 3 refills | Status: DC

## 2023-07-25 NOTE — Assessment & Plan Note (Signed)
 Pernicious anemia results from an inability to absorb B12, requiring lifelong B12 injections. Subacute combined degeneration affects nerves and balance, necessitating increased B12 to promote nerve regeneration. Administer B12 injections weekly. Monitor B12 levels and adjust treatment as needed based on blood work.
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
## 2023-07-25 NOTE — Assessment & Plan Note (Signed)
 Heel to toe imperfect butimproving

## 2023-07-25 NOTE — Assessment & Plan Note (Signed)
 A 3 cm benign-by-MRI complex cyst on the right kidney, with previous concerns about potential kidney cancer ruled out by MRI. Slow progression of kidney disease with declining GFR over the past few years. Follow up with a urologist to confirm MRI findings and discuss management of the kidney cyst. Preference for monitoring the cyst again in a year or sooner, despite MRI's benign findings. Monitor kidney function with regular blood tests as needed. Await nephrologist appointment for further evaluation of kidney function.

## 2023-07-25 NOTE — Assessment & Plan Note (Signed)
 Increase supplement toweekly monthly is not enough

## 2023-07-25 NOTE — Progress Notes (Signed)
 ==============================  [facility]: [phone]   -- Medical Office Visit --  Patient: Ralph Hurst      Age: 63 y.o.       Sex:  male  Date:   07/25/2023 Today's Healthcare Provider: Anthon Kins, MD  ==============================   Chief Complaint: Chronic Kidney Disease (Has urology appt tomorrow )  Discussed the use of AI scribe software for clinical note transcription with the patient, who gave verbal consent to proceed.  History of Present Illness 63 year old male who presents for disability evaluation and follow-up on kidney health.  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 A neuropsychological evaluation was inconclusive due to test anxiety and incomplete data. He is on B12 injections once a month and uses a CPAP machine for sleep apnea, but has not noticed significant cognitive improvement.  He has a history of kidney issues, including a cyst on his right kidney and fluctuating kidney function. Recent MRI results showed no cancer. His kidney function has been declining slowly over the past few years, with recent GFR readings around 46-54%. He is scheduled to see a urologist for further evaluation.  He is taking B12 injections once a month for pernicious anemia. No tingling sensations or nerve pain are reported. He uses a CPAP machine for sleep apnea, which has not yet resulted in significant cognitive improvement.   Updated Problem List Entries: Problem  Balance Disorder  Cognitive Impairment  B12 Deficiency    Background Reviewed: Problem List: has Hyperlipidemia; Tobacco use disorder; Arthralgia; Type 2 diabetes with stage 3 chronic kidney disease GFR 30-59 (HCC); Hypertension associated  with diabetes (HCC); OSA (obstructive sleep apnea); Memory loss; Subacute combined degeneration (HCC); Macrocytic anemia; Obesity due to energy imbalance; Tick bite of abdomen; Allergic rhinitis; Cerebral ischemia; Persistent cognitive impairment, vascular and nutritional, disabling; Pernicious anemia; Complex renal cyst; Kidney stone; Hepatic steatosis; Vitamin D  deficiency; Long-term current use of injectable noninsulin antidiabetic medication; Macrocytosis associated with alcohol; Dizziness; Acute kidney injury superimposed on chronic kidney disease (HCC); Balance disorder; Cognitive impairment; and B12 deficiency on their problem list. Past Medical History:  has a past medical history of Diabetes mellitus without complication (HCC), Essential hypertension (09/14/2006), Hyperlipidemia, Hypertension, Need for immunization against influenza (10/11/2017), Polycythemia, Polycythemia, secondary (09/14/2006), Routine general medical examination at a health care facility (10/11/2017), and Sleep stage dysfunction (10/17/2016). Past Surgical History:   has no past surgical history on file. Social History:   reports that he has been smoking cigarettes. He has never used smokeless tobacco. He reports current alcohol use of about 12.0 standard drinks of alcohol per week. He reports that he does not use drugs. Family History:  family history includes Asthma in his father and mother; COPD in his brother and mother; Diabetes in his mother and sister; Hearing loss in his brother and mother; Hyperlipidemia in his father, mother, and sister; Hypertension in his brother, father, mother, and sister; Kidney disease in his mother; Sleep apnea in his father, mother, and son. Allergies:  has no known allergies.   Medication Reconciliation: Current Outpatient Medications on File Prior to Visit  Medication Sig   aspirin  EC 81 MG tablet Take 1 tablet (81 mg total) by mouth daily.   atenolol  (TENORMIN ) 100 MG tablet Take 1 tablet  (  100 mg total) by mouth daily.   Blood Glucose Monitoring Suppl DEVI 1 each by Does not apply route in the morning, at noon, and at bedtime. May substitute to any manufacturer covered by patient's insurance.   buPROPion  ER (WELLBUTRIN  SR) 100 MG 12 hr tablet Take 1 tablet (100 mg total) by mouth in the morning.   Cholecalciferol (VITAMIN D3) 50 MCG (2000 UT) capsule Take 1 capsule (2,000 Units total) by mouth daily.   empagliflozin  (JARDIANCE ) 10 MG TABS tablet Take 1 tablet (10 mg total) by mouth daily before breakfast.   fexofenadine (ALLEGRA ALLERGY) 180 MG tablet Take 180 mg by mouth daily.   Lancets (ONETOUCH ULTRASOFT) lancets Use once daily. Dx E11.9   losartan  (COZAAR ) 100 MG tablet Take 1 tablet by mouth once daily   NEEDLE, DISP, 25 G (B-D DISP NEEDLE 25GX1) 25G X 1 MISC Use to give with B 12   Omega-3 Fatty Acids (FISH OIL) 1200 MG CAPS Take by mouth.   rosuvastatin  (CRESTOR ) 10 MG tablet Take 1 tablet by mouth once daily   Semaglutide ,0.25 or 0.5MG /DOS, (OZEMPIC , 0.25 OR 0.5 MG/DOSE,) 2 MG/3ML SOPN Inject 0.5 mg into the skin once a week.   spironolactone  (ALDACTONE ) 50 MG tablet Take 1 tablet (50 mg total) by mouth daily.   SYRINGE-NEEDLE, DISP, 3 ML (LUER LOCK SAFETY SYRINGES) 25G X 1 3 ML MISC Use to take B 12   Current Facility-Administered Medications on File Prior to Visit  Medication   cyanocobalamin  (VITAMIN B12) injection 1,000 mcg   cyanocobalamin  (VITAMIN B12) injection 1,000 mcg   Medications Discontinued During This Encounter  Medication Reason   Cyanocobalamin  (B-12) 1000 MCG SUBL Reorder     Physical Exam:    07/25/2023   10:49 AM 07/05/2023    8:53 AM 06/19/2023   10:49 AM  Vitals with BMI  Height 5' 10 5' 10   Weight 211 lbs 6 oz 215 lbs   BMI 30.33 30.85   Systolic 118 106 93  Diastolic 64 68 60  Pulse 71 81   Vital signs reviewed.  Nursing notes reviewed. Weight trend reviewed. Physical Exam General Appearance:  No acute distress appreciable.    Well-groomed, healthy-appearing male.  Well proportioned with no abnormal fat distribution.  Good muscle tone. Pulmonary:  Normal work of breathing at rest, no respiratory distress apparent. SpO2: 98 %  Musculoskeletal: All extremities are intact.  Neurological:  Awake, alert, oriented, and engaged.  No obvious focal neurological deficits or cognitive impairments.  Sensorium seems unclouded.   Speech is clear and coherent with logical content. Psychiatric:  Appropriate mood, pleasant and cooperative demeanor, thoughtful and engaged during the exam Physical Exam NEUROLOGICAL: Cranial nerves grossly intact. Moves all extremities without gross motor or sensory deficit. Heel to toe gait is unsteady.  Results:    02/14/2023   10:49 AM 01/19/2023   11:05 AM 12/12/2022    8:59 AM 12/01/2022    8:28 AM  PHQ 2/9 Scores  PHQ - 2 Score 0 0 0 0  PHQ- 9 Score 2   0   Results LABS GFR: 46% (06/19/2023)  RADIOLOGY Kidney MRI: No malignancy, benign 3 cm cyst on the right kidney (07/23/2023)    No results found for any visits on 07/25/23. Office Visit on 07/05/2023  Component Date Value Ref Range Status   Glucose, Bld 07/05/2023 115 (H)  65 - 99 mg/dL Final   BUN 11/91/4782 25  7 - 25 mg/dL Final   Creat 95/62/1308 1.58 (  H)  0.70 - 1.35 mg/dL Final   BUN/Creatinine Ratio 07/05/2023 16  6 - 22 (calc) Final   Sodium 07/05/2023 139  135 - 146 mmol/L Final   Potassium 07/05/2023 4.1  3.5 - 5.3 mmol/L Final   Chloride 07/05/2023 104  98 - 110 mmol/L Final   CO2 07/05/2023 24  20 - 32 mmol/L Final   Calcium  07/05/2023 10.0  8.6 - 10.3 mg/dL Final   Microalb, Ur 40/98/1191 <0.7  mg/dL Final   Creatinine,U 47/82/9562 113.2  mg/dL Final   Microalb Creat Ratio 07/05/2023 Unable to calculate  0.0 - 30.0 mg/g Final   CYSTATIN C 07/05/2023 2.08 (H)  0.52 - 1.20 mg/L Final   eGFR 07/05/2023 29 (L)  >=60 mL/min/1.68m2 Final  Office Visit on 06/19/2023  Component Date Value Ref Range Status   Creatinine,  Urine 06/19/2023 106  20 - 320 mg/dL Final   Protein/Creat Ratio 06/19/2023 104  25 - 148 mg/g creat Final   Protein/Creatinine Ratio 06/19/2023 0.104  0.025 - 0.148 mg/mg creat Final   Total Protein, Urine 06/19/2023 11  5 - 25 mg/dL Final   Color, Urine 13/09/6576 YELLOW  YELLOW Final   APPearance 06/19/2023 CLEAR  CLEAR Final   Specific Gravity, Urine 06/19/2023 1.026  1.001 - 1.035 Final   pH 06/19/2023 5.5  5.0 - 8.0 Final   Glucose, UA 06/19/2023 3+ (A)  NEGATIVE Final   Bilirubin Urine 06/19/2023 NEGATIVE  NEGATIVE Final   Ketones, ur 06/19/2023 NEGATIVE  NEGATIVE Final   Hgb urine dipstick 06/19/2023 NEGATIVE  NEGATIVE Final   Protein, ur 06/19/2023 NEGATIVE  NEGATIVE Final   Nitrites, Initial 06/19/2023 NEGATIVE  NEGATIVE Final   Leukocyte Esterase 06/19/2023 NEGATIVE  NEGATIVE Final   WBC, UA 06/19/2023 NONE SEEN  0 - 5 /HPF Final   RBC / HPF 06/19/2023 NONE SEEN  0 - 2 /HPF Final   Squamous Epithelial / HPF 06/19/2023 NONE SEEN  < OR = 5 /HPF Final   Bacteria, UA 06/19/2023 NONE SEEN  NONE SEEN /HPF Final   Hyaline Cast 06/19/2023 NONE SEEN  NONE SEEN /LPF Final   Note 06/19/2023    Final   Sodium 06/19/2023 135  135 - 145 mEq/L Final   Potassium 06/19/2023 4.2  3.5 - 5.1 mEq/L Final   Chloride 06/19/2023 103  96 - 112 mEq/L Final   CO2 06/19/2023 25  19 - 32 mEq/L Final   Glucose, Bld 06/19/2023 115 (H)  70 - 99 mg/dL Final   BUN 46/96/2952 23  6 - 23 mg/dL Final   Creatinine, Ser 06/19/2023 1.58 (H)  0.40 - 1.50 mg/dL Final   GFR 84/13/2440 46.24 (L)  >60.00 mL/min Final   Calcium  06/19/2023 9.3  8.4 - 10.5 mg/dL Final   Microalb, Ur 12/04/2534 <0.7  mg/dL Final   Creatinine,U 64/40/3474 105.6  mg/dL Final   Microalb Creat Ratio 06/19/2023 Unable to calculate  0.0 - 30.0 mg/g Final   Reflexve Urine Culture 06/19/2023    Final  Office Visit on 05/19/2023  Component Date Value Ref Range Status   WBC 05/19/2023 7.5  4.0 - 10.5 K/uL Final   RBC 05/19/2023 4.69  4.22 -  5.81 Mil/uL Final   Hemoglobin 05/19/2023 15.9  13.0 - 17.0 g/dL Final   HCT 25/95/6387 45.9  39.0 - 52.0 % Final   MCV 05/19/2023 97.8  78.0 - 100.0 fl Final   MCHC 05/19/2023 34.7  30.0 - 36.0 g/dL Final   RDW 56/43/3295 11.8  11.5 - 15.5 % Final   Platelets 05/19/2023 221.0  150.0 - 400.0 K/uL Final   Neutrophils Relative % 05/19/2023 70.5  43.0 - 77.0 % Final   Lymphocytes Relative 05/19/2023 18.9  12.0 - 46.0 % Final   Monocytes Relative 05/19/2023 6.4  3.0 - 12.0 % Final   Eosinophils Relative 05/19/2023 3.4  0.0 - 5.0 % Final   Basophils Relative 05/19/2023 0.8  0.0 - 3.0 % Final   Neutro Abs 05/19/2023 5.3  1.4 - 7.7 K/uL Final   Lymphs Abs 05/19/2023 1.4  0.7 - 4.0 K/uL Final   Monocytes Absolute 05/19/2023 0.5  0.1 - 1.0 K/uL Final   Eosinophils Absolute 05/19/2023 0.3  0.0 - 0.7 K/uL Final   Basophils Absolute 05/19/2023 0.1  0.0 - 0.1 K/uL Final   Sodium 05/19/2023 137  135 - 145 mEq/L Final   Potassium 05/19/2023 4.7  3.5 - 5.1 mEq/L Final   Chloride 05/19/2023 101  96 - 112 mEq/L Final   CO2 05/19/2023 28  19 - 32 mEq/L Final   Glucose, Bld 05/19/2023 176 (H)  70 - 99 mg/dL Final   BUN 91/47/8295 20  6 - 23 mg/dL Final   Creatinine, Ser 05/19/2023 1.37  0.40 - 1.50 mg/dL Final   Total Bilirubin 05/19/2023 0.5  0.2 - 1.2 mg/dL Final   Alkaline Phosphatase 05/19/2023 55  39 - 117 U/L Final   AST 05/19/2023 18  0 - 37 U/L Final   ALT 05/19/2023 25  0 - 53 U/L Final   Total Protein 05/19/2023 6.6  6.0 - 8.3 g/dL Final   Albumin 62/13/0865 4.5  3.5 - 5.2 g/dL Final   GFR 78/46/9629 54.90 (L)  >60.00 mL/min Final   Calcium  05/19/2023 9.6  8.4 - 10.5 mg/dL Final   Cholesterol 52/84/1324 91  0 - 200 mg/dL Final   Triglycerides 40/11/2723 275.0 (H)  0.0 - 149.0 mg/dL Final   HDL 36/64/4034 25.60 (L)  >74.25 mg/dL Final   VLDL 95/63/8756 55.0 (H)  0.0 - 40.0 mg/dL Final   LDL Cholesterol 05/19/2023 11  0 - 99 mg/dL Final   Total CHOL/HDL Ratio 05/19/2023 4   Final   NonHDL  05/19/2023 65.55   Final   Hgb A1c MFr Bld 05/19/2023 6.8 (H)  4.6 - 6.5 % Final   Microalb, Ur 05/19/2023 <0.7  mg/dL Final   Creatinine,U 43/32/9518 146.0  mg/dL Final   Microalb Creat Ratio 05/19/2023 Unable to calculate  0.0 - 30.0 mg/g Final   Vitamin B-12 05/19/2023 >1537 (H)  211 - 911 pg/mL Final   Folate 05/19/2023 14.0  >5.9 ng/mL Final   Intrinsic Factor 05/19/2023 Positive (A)  Negative Final   VITD 05/19/2023 23.09 (L)  30.00 - 100.00 ng/mL Final  Office Visit on 12/01/2022  Component Date Value Ref Range Status   Microalb, Ur 12/01/2022 <0.7  0.0 - 1.9 mg/dL Final   Creatinine,U 84/16/6063 111.6  mg/dL Final   Microalb Creat Ratio 12/01/2022 0.6  0.0 - 30.0 mg/g Final   Sodium 12/01/2022 136  135 - 145 mEq/L Final   Potassium 12/01/2022 4.1  3.5 - 5.1 mEq/L Final   Chloride 12/01/2022 100  96 - 112 mEq/L Final   CO2 12/01/2022 28  19 - 32 mEq/L Final   Glucose, Bld 12/01/2022 264 (H)  70 - 99 mg/dL Final   BUN 01/60/1093 18  6 - 23 mg/dL Final   Creatinine, Ser 12/01/2022 1.41  0.40 - 1.50 mg/dL Final  Total Bilirubin 12/01/2022 0.6  0.2 - 1.2 mg/dL Final   Alkaline Phosphatase 12/01/2022 53  39 - 117 U/L Final   AST 12/01/2022 18  0 - 37 U/L Final   ALT 12/01/2022 21  0 - 53 U/L Final   Total Protein 12/01/2022 6.7  6.0 - 8.3 g/dL Final   Albumin 40/98/1191 4.2  3.5 - 5.2 g/dL Final   GFR 47/82/9562 53.21 (L)  >60.00 mL/min Final   Calcium  12/01/2022 9.4  8.4 - 10.5 mg/dL Final   Hgb Z3Y MFr Bld 12/01/2022 6.8 (H)  4.6 - 6.5 % Final   WBC 12/01/2022 6.1  4.0 - 10.5 K/uL Final   RBC 12/01/2022 4.53  4.22 - 5.81 Mil/uL Final   Platelets 12/01/2022 197.0  150.0 - 400.0 K/uL Final   Hemoglobin 12/01/2022 15.3  13.0 - 17.0 g/dL Final   HCT 86/57/8469 45.9  39.0 - 52.0 % Final   MCV 12/01/2022 101.4 (H)  78.0 - 100.0 fl Final   MCHC 12/01/2022 33.4  30.0 - 36.0 g/dL Final   RDW 62/95/2841 12.5  11.5 - 15.5 % Final   Vitamin B-12 12/01/2022 210 (L)  211 - 911 pg/mL  Final  Office Visit on 06/01/2022  Component Date Value Ref Range Status   PSA 06/01/2022 0.69  0.10 - 4.00 ng/mL Final  Lab on 05/25/2022  Component Date Value Ref Range Status   Microalb, Ur 05/25/2022 1.0  0.0 - 1.9 mg/dL Final   Creatinine,U 32/44/0102 147.3  mg/dL Final   Microalb Creat Ratio 05/25/2022 0.7  0.0 - 30.0 mg/g Final   TSH 05/25/2022 1.67  0.35 - 5.50 uIU/mL Final   Hgb A1c MFr Bld 05/25/2022 6.5  4.6 - 6.5 % Final   WBC 05/25/2022 8.3  4.0 - 10.5 K/uL Final   RBC 05/25/2022 4.35  4.22 - 5.81 Mil/uL Final   Platelets 05/25/2022 206.0  150.0 - 400.0 K/uL Final   Hemoglobin 05/25/2022 14.9  13.0 - 17.0 g/dL Final   HCT 72/53/6644 42.1  39.0 - 52.0 % Final   MCV 05/25/2022 96.9  78.0 - 100.0 fl Final   MCHC 05/25/2022 35.3  30.0 - 36.0 g/dL Final   RDW 03/47/4259 12.9  11.5 - 15.5 % Final   Sodium 05/25/2022 139  135 - 145 mEq/L Final   Potassium 05/25/2022 4.5  3.5 - 5.1 mEq/L Final   Chloride 05/25/2022 104  96 - 112 mEq/L Final   CO2 05/25/2022 27  19 - 32 mEq/L Final   Glucose, Bld 05/25/2022 130 (H)  70 - 99 mg/dL Final   BUN 56/38/7564 15  6 - 23 mg/dL Final   Creatinine, Ser 05/25/2022 1.25  0.40 - 1.50 mg/dL Final   Total Bilirubin 05/25/2022 0.5  0.2 - 1.2 mg/dL Final   Alkaline Phosphatase 05/25/2022 48  39 - 117 U/L Final   AST 05/25/2022 19  0 - 37 U/L Final   ALT 05/25/2022 26  0 - 53 U/L Final   Total Protein 05/25/2022 6.3  6.0 - 8.3 g/dL Final   Albumin 33/29/5188 4.2  3.5 - 5.2 g/dL Final   GFR 41/66/0630 61.71  >60.00 mL/min Final   Calcium  05/25/2022 9.1  8.4 - 10.5 mg/dL Final   Cholesterol 16/02/930 97  0 - 200 mg/dL Final   Triglycerides 35/57/3220 151.0 (H)  0.0 - 149.0 mg/dL Final   HDL 25/42/7062 30.80 (L)  >37.62 mg/dL Final   VLDL 83/15/1761 30.2  0.0 - 40.0 mg/dL Final  LDL Cholesterol 05/25/2022 36  0 - 99 mg/dL Final   Total CHOL/HDL Ratio 05/25/2022 3   Final   NonHDL 05/25/2022 66.39   Final  No image results found. MR Abdomen  W Wo Contrast Result Date: 07/23/2023 CLINICAL DATA:  Renal mass/cyst on ultrasound EXAM: MRI ABDOMEN WITHOUT AND WITH CONTRAST TECHNIQUE: Multiplanar multisequence MR imaging of the abdomen was performed both before and after the administration of intravenous contrast. CONTRAST:  10 mL Vueway  IV COMPARISON:  Renal ultrasound dated 05/26/2023 FINDINGS: Lower chest: Lung bases are clear. Hepatobiliary: Liver is within normal limits.  No hepatic steatosis. Gallbladder is unremarkable. No intrahepatic or extrahepatic ductal dilatation. Pancreas:  Within normal limits. Spleen:  Within normal limits. Adrenals/Urinary Tract:  Adrenal glands are within normal limits. Simple bilateral renal cysts measuring up to 16 mm in the left lower kidney (series 4/image 33), benign (Bosniak I). Mildly complex/septated right upper pole renal cyst measuring 3.0 cm (series 4/image 25), corresponding to the sonographic abnormality, benign (Bosniak II). No follow-up is recommended. No hydronephrosis. Stomach/Bowel: Stomach is within normal limits. Visualized bowel is grossly unremarkable. Vascular/Lymphatic:  No evidence of abdominal aortic aneurysm. No suspicious abdominal lymphadenopathy. Other:  No abdominal ascites. Musculoskeletal: No focal osseous lesions. IMPRESSION: Mildly complex/septated right upper pole renal cyst measuring 3.0 cm, corresponding to the sonographic abnormality, benign (Bosniak II). No follow-up is recommended. Additional simple bilateral renal cysts, benign (Bosniak I). Electronically Signed   By: Zadie Herter M.D.   On: 07/23/2023 01:22   US  Renal Result Date: 06/03/2023 CLINICAL DATA:  ckd3, fh kidney dz EXAM: RENAL / URINARY TRACT ULTRASOUND COMPLETE COMPARISON:  None Available. FINDINGS: Right Kidney: Renal measurements: 12.2 x 5.9 x 4.5 cm = volume: 170 mL. Echogenicity within normal limits. No hydronephrosis visualized. In the superior pole of the RIGHT kidney, there is an anechoic mass with several  prominent internal septations which measures 3.4 x 2.3 x 2.5 cm. Echogenic focus with posterior acoustic shadowing in the inferior pole measuring up to 5 mm. Left Kidney: Renal measurements: 11.8 x 6.1 x 5.2 cm = volume: 195 mL. Echogenicity within normal limits. No mass or hydronephrosis visualized. Bladder: Appears normal for degree of bladder distention. Other: Increased hepatic echogenicity. IMPRESSION: 1. There is a 3.4 cm septated cyst in the superior pole of the RIGHT kidney. This likely reflects a mildly complicated cyst. Recommend further evaluation with dedicated renal protocol CT or MRI with and without contrast. 2. Nonobstructive RIGHT nephrolithiasis versus vascular calcification. 3. Increased hepatic echogenicity as can be seen in hepatic steatosis. These results will be called to the ordering clinician or representative by the Radiologist Assistant, and communication documented in the PACS or [employer]. Electronically Signed   By: Clancy Crimes M.D.   On: 06/03/2023 11:18        Assessment & Plan Cognitive impairment Cognitive impairment persists with inconclusive neuropsychological test results, potentially affected by test anxiety. No Alzheimer's diagnosis. B12 deficiency and sleep apnea may contribute. Current treatment includes CPAP and B12 injections. Balance issues may relate to subacute combined degeneration from B12 deficiency. Underemployment due to cognitive and balance issues affects his ability to perform complex tasks and work on ladders. Refer to a new neuropsychologist for further evaluation to address test anxiety and obtain conclusive results. Follow up with a neurologist to reassess cognitive function and balance issues. Increase B12 injections to weekly to address deficiency and improve cognitive function. Encourage continued use of CPAP for sleep apnea management. Consider physical therapy to improve  balance and provide additional data for a disability  claim. Subacute combined degeneration (HCC) Was due to late diagnosis pernicious anemia.  Now compliant with injectable B12 but monthly isn't likely enough. B12 deficiency Increase supplement toweekly monthly is not enough Balance disorder Heel to toe imperfect butimproving Pernicious anemia Pernicious anemia results from an inability to absorb B12, requiring lifelong B12 injections. Subacute combined degeneration affects nerves and balance, necessitating increased B12 to promote nerve regeneration. Administer B12 injections weekly. Monitor B12 levels and adjust treatment as needed based on blood work. Complex renal cyst A 3 cm benign-by-MRI complex cyst on the right kidney, with previous concerns about potential kidney cancer ruled out by MRI. Slow progression of kidney disease with declining GFR over the past few years. Follow up with a urologist to confirm MRI findings and discuss management of the kidney cyst. Preference for monitoring the cyst again in a year or sooner, despite MRI's benign findings. Monitor kidney function with regular blood tests as needed. Await nephrologist appointment for further evaluation of kidney function. Goals of care, counseling/discussion Goals of Care   Discussion focused on supporting a disability claim due to cognitive and balance issues impacting employment. Emphasis on obtaining necessary medical documentation and evaluations to support the claim. He is unable to perform complex tasks or work on ladders, limiting employment opportunities. Assist with obtaining necessary medical documentation for the disability claim. Coordinate with a social security disability representative for further steps. Stage 3 chronic kidney disease, unspecified whether stage 3a or 3b CKD (HCC) Following up closely is encouraged and planned  Lab Results  Component Value Date   GFR 46.24 (L) 06/19/2023   GFR 54.90 (L) 05/19/2023   GFR 53.21 (L) 12/01/2022   GFR 61.71 05/25/2022    GFR 87.64 10/11/2017   EGFR 29 (L) 07/05/2023          Orders Placed in Encounter:   Dr. Georgeanne King provided comprehensive testing but it was inconclusive and he felt anxiety interfered with results.  We will sent to alternative neuropsychologist and include his results.  We will increase B12 supplement as I think he is not responding well.      Referral Orders         Ambulatory referral to Neuropsychology    Meds ordered this encounter  Medications   Cyanocobalamin  (B-12 COMPLIANCE INJECTION) 1000 MCG/ML KIT    Sig: Inject 1,000 mg as directed once a week.    Dispense:  12 kit    Refill:  3   Cyanocobalamin  (B-12) 1000 MCG SUBL    Sig: Place 1 tablet under the tongue daily at 6 (six) AM.    Dispense:  90 tablet    Refill:  3     Ambulatory referral to Neuropsychology       Comments: Previously seen by neuropsychology Dr. Georgeanne King who declined to retest for 1 year or support Social Security Disability Insurance (SSDI) disability claim- advised due to incomplete testing and test anxiety issues. Primary Care Provider (PCP) strongly supports disability claim, primarily on neuropsychological grounds. Request 2nd opinion.          This document was synthesized by artificial intelligence ([employer]) using HIPAA-compliant recording of the clinical interaction;   We discussed the use of AI scribe software for clinical note transcription with the patient, who gave verbal consent to proceed. additional Info: This encounter employed state-of-the-art, real-time, collaborative documentation. The patient actively reviewed and assisted in updating their electronic medical record on a shared screen, ensuring transparency and  facilitating joint problem-solving for the problem list, overview, and plan. This approach promotes accurate, informed care. The treatment plan was discussed and reviewed in detail, including medication safety, potential side effects, and all patient questions. We confirmed understanding  and comfort with the plan. Follow-up instructions were established, including contacting the office for any concerns, returning if symptoms worsen, persist, or new symptoms develop, and precautions for potential emergency department visits.

## 2023-07-25 NOTE — Progress Notes (Signed)
 Chief Complaint: No chief complaint on file.   History of Present Illness:  Ralph Hurst is a 63 y.o. male who is seen in consultation from Anthon Kins, MD for evaluation of ultrasound evidence of a right kidney stone as well as a complex cyst.  He does have mild renal insufficiency.  He is waiting for consultation with [facility].  He has no prior history of kidney stones or kidney problems.  No blood in his urine.   Past Medical History:  Past Medical History:  Diagnosis Date   Diabetes mellitus without complication (HCC)    Essential hypertension 09/14/2006   Qualifier: Diagnosis of   By: Ena Harries, RN, Ellen       Hyperlipidemia    Hypertension    Need for immunization against influenza 10/11/2017   Polycythemia    Polycythemia, secondary 09/14/2006   Lab Results      Component    Value    Date/Time           HGB    15.9    05/19/2023 09:56 AM           HGB    15.3    12/01/2022 09:03 AM           HGB    14.9    05/25/2022 08:30 AM           HGB    17.4 (H)    10/11/2017 10:38 AM           HGB    17.2 (H)    06/10/2016 09:11 AM  Takes 2 baby aspirin  daily     Secondary Polycythemia (D75.1) #ChronicCondition #OSARelated  STATUS: 05/20/2023: Long   Routine general medical examination at a health care facility 10/11/2017   Sleep stage dysfunction 10/17/2016    Past Surgical History:  No past surgical history on file.  Allergies:  No Known Allergies  Family History:  Family History  Problem Relation Age of Onset   Kidney disease Mother    Hyperlipidemia Mother    Hearing loss Mother    COPD Mother    Hypertension Mother    Diabetes Mother    Asthma Mother    Sleep apnea Mother    Sleep apnea Father    Hyperlipidemia Father    Asthma Father    Hypertension Father    Hypertension Sister    Hyperlipidemia Sister    Diabetes Sister    Hypertension Brother    Hearing loss Brother    COPD Brother    Sleep apnea Son     Social History:   Social History   Tobacco Use   Smoking status: Every Day    Current packs/day: 0.50    Types: Cigarettes   Smokeless tobacco: Never  Vaping Use   Vaping status: Never Used  Substance Use Topics   Alcohol use: Yes    Alcohol/week: 12.0 standard drinks of alcohol    Types: 8 Cans of beer, 4 Shots of liquor per week   Drug use: No    Review of symptoms:  Constitutional:  Negative for unexplained weight loss, night sweats, fever, chills ENT:  Negative for nose bleeds, sinus pain, painful swallowing CV:  Negative for chest pain, shortness of breath, exercise intolerance, palpitations, loss of consciousness Resp:  Negative for cough, wheezing, shortness of breath GI:  Negative for nausea, vomiting, diarrhea, bloody stools GU:  Positives noted in HPI; otherwise negative for gross hematuria, dysuria, urinary incontinence Neuro:  Negative for seizures, poor balance, limb weakness, slurred speech Psych:  Negative for lack of energy, depression, anxiety Endocrine:  Negative for polydipsia, polyuria, symptoms of hypoglycemia (dizziness, hunger, sweating) Hematologic:  Negative for anemia, purpura, petechia, prolonged or excessive bleeding, use of anticoagulants  Allergic:  Negative for difficulty breathing or choking as a result of exposure to anything; no shellfish allergy; no allergic response (rash/itch) to materials, foods  Physical exam: There were no vitals taken for this visit. GENERAL APPEARANCE:  Well appearing, well developed, well nourished, NAD HEENT: Atraumatic, Normocephalic. NECK: Normal appearance LUNGS: Normal inspiratory and expiratory excursion HEART: Regular Rate  EXTREMITIES: Moves all extremities well.  Without clubbing, cyanosis, or edema. NEUROLOGIC:  Alert and oriented x 3, normal gait, CN II-XII grossly intact.  MENTAL STATUS:  Appropriate. SKIN:  Warm, dry and intact.    Results: No results found for this or any previous visit (from the past 24 hours).  I  have reviewed referring/prior physicians notes  I have reviewed urinalysis--clear  I have reviewed PSA results--0.69 in April of last year  I have reviewed prior imaging--ultrasound images reviewed.  We also reviewed his MRI images/reading.  He has bilateral simple renal cysts, Bosniak category 1, and each kidney.  There is a mildly complex/septated right upper pole cyst measuring 3 cm.  I think that these are 2 adjacent simple cysts, Bosniak category 2   Assessment: 1.  Bilateral simple renal cysts, 1 cyst, the largest is considered a Bosniak category 2.  2.  Possible right renal calculus versus vascular calcification   Plan: 1.  I reassured the patient and his wife, who attended the visit with him that I do not think he needs further follow-up with these cysts  2.  Similar with regards to the possible right renal stone  3.  Return as needed

## 2023-07-25 NOTE — Patient Instructions (Signed)
 VISIT SUMMARY:  During your visit, we discussed your cognitive impairment, kidney health, and pernicious anemia. We also talked about supporting your disability claim due to the impact of these health issues on your employment.  YOUR PLAN:  -COGNITIVE IMPAIRMENT: Cognitive impairment means having trouble with memory, learning new things, concentrating, or making decisions. We will refer you to a new neuropsychologist to address test anxiety and get conclusive results. You should also follow up with a neurologist to reassess your cognitive function and balance issues. We will increase your B12 injections to weekly to help improve your cognitive function and encourage you to continue using your CPAP machine for sleep apnea. Physical therapy may help improve your balance and provide additional data for your disability claim.  -PERNICIOUS ANEMIA: Pernicious anemia is a condition where your body can't absorb enough vitamin B12, which is necessary for making red blood cells. This requires lifelong B12 injections. We will increase your B12 injections to weekly to help with nerve regeneration and improve your balance. We will also monitor your B12 levels and adjust treatment as needed based on blood work.  -KIDNEY CYST: A kidney cyst is a fluid-filled sac within the kidney. Your recent MRI showed a benign cyst on your right kidney, and there is no cancer. We will follow up with a urologist to confirm the MRI findings and discuss management of the cyst. We prefer to monitor the cyst again in a year or sooner if needed. We will also monitor your kidney function with regular blood tests and await your nephrologist appointment for further evaluation.  -GOALS OF CARE: We discussed supporting your disability claim due to cognitive and balance issues affecting your employment. We will assist you in obtaining the necessary medical documentation and evaluations to support your claim and coordinate with a social security  disability representative for further steps.  INSTRUCTIONS:  Please follow up with a urologist on July 26, 2023, and maintain your appointment on August 14, 2023, for further evaluation of your disability and kidney issues. Also, monitor for communication from the nephrologist for appointment scheduling.

## 2023-07-25 NOTE — Assessment & Plan Note (Signed)
 Cognitive impairment persists with inconclusive neuropsychological test results, potentially affected by test anxiety. No Alzheimer's diagnosis. B12 deficiency and sleep apnea may contribute. Current treatment includes CPAP and B12 injections. Balance issues may relate to subacute combined degeneration from B12 deficiency. Underemployment due to cognitive and balance issues affects his ability to perform complex tasks and work on ladders. Refer to a new neuropsychologist for further evaluation to address test anxiety and obtain conclusive results. Follow up with a neurologist to reassess cognitive function and balance issues. Increase B12 injections to weekly to address deficiency and improve cognitive function. Encourage continued use of CPAP for sleep apnea management. Consider physical therapy to improve balance and provide additional data for a disability claim.

## 2023-07-25 NOTE — Assessment & Plan Note (Signed)
 Was due to late diagnosis pernicious anemia.  Now compliant with injectable B12 but monthly isn't likely enough.

## 2023-07-25 NOTE — Progress Notes (Signed)
   NEUROPSYCHOLOGICAL EVALUATION [place]  Physical Medicine and Rehabilitation     Patient: Ralph Hurst  MRN: [id number] DOB: 1961-01-04   Service Provider/Clinical Neuropsychologist: Loletta Ripple, PsyD  Date of Service: 07/14/23 Start Time: 2 PM End Time: 3 PM  Location of Service:  [facility] Physical Medicine & Rehabilitation Department [place]. [facility] [address] [place] [place], [place] [address] Phone: [phone]   Billing Code/Service: [phone]    Individuals present: Patient, patient's spouse (with permission), Provider Loletta Ripple, PsyD)  Provider conducted the 60-minute interactive feedback appointment in-person with the patient and his spouse.  The provider reviewed and discussed the results of neuropsychological evaluation. Follow-up interviewing was conducted as needed to refine interpretation of findings as needed. Review of results included overall findings, diagnosis, and treatment planning/recommendations that were derived from integration of patient data, interpretation of standardized rest results and clinical data, and clinical decision making, which are documented in the patient's electronic medical record with the full report (date listed below). A copy of the full report will also be mailed to the patient.   The patient expressed understanding of the information reviewed. The patient was provided opportunity to ask questions which were then answered by the provider. The provider worked collaboratively to tailor treatment recommendations to the patient when possible. The patient was informed they could reach out to the provider should additional questions related to the evaluation arise.    The final neuropsychological evaluation report, documented in the patient's chart (DATE 07/11/23) was amended to reflect any additional information obtained during the feedback appointment including treatment planning  collaboration.    This report was generated using voice recognition software. While this document has been carefully reviewed, transcription errors may be present. I apologize in advance for any inconvenience. Please contact me if further clarification is needed.             Loletta Ripple, PsyD             Neuropsychologist

## 2023-07-26 ENCOUNTER — Ambulatory Visit (INDEPENDENT_AMBULATORY_CARE_PROVIDER_SITE_OTHER): Admitting: Urology

## 2023-07-26 VITALS — BP 116/79 | HR 76 | Ht 70.0 in | Wt 210.0 lb

## 2023-07-26 DIAGNOSIS — N281 Cyst of kidney, acquired: Secondary | ICD-10-CM | POA: Diagnosis not present

## 2023-07-26 DIAGNOSIS — R93421 Abnormal radiologic findings on diagnostic imaging of right kidney: Secondary | ICD-10-CM | POA: Diagnosis not present

## 2023-07-26 DIAGNOSIS — N200 Calculus of kidney: Secondary | ICD-10-CM

## 2023-07-26 LAB — URINALYSIS, ROUTINE W REFLEX MICROSCOPIC
Bilirubin, UA: NEGATIVE
Glucose, UA: NEGATIVE
Leukocytes,UA: NEGATIVE
Nitrite, UA: NEGATIVE
Protein,UA: NEGATIVE
RBC, UA: NEGATIVE
Specific Gravity, UA: 1.02 (ref 1.005–1.030)
Urobilinogen, Ur: 1 mg/dL (ref 0.2–1.0)
pH, UA: 5.5 (ref 5.0–7.5)

## 2023-07-26 LAB — MICROSCOPIC EXAMINATION

## 2023-08-08 DIAGNOSIS — G4733 Obstructive sleep apnea (adult) (pediatric): Secondary | ICD-10-CM | POA: Diagnosis not present

## 2023-08-09 DIAGNOSIS — D631 Anemia in chronic kidney disease: Secondary | ICD-10-CM | POA: Diagnosis not present

## 2023-08-09 DIAGNOSIS — N2581 Secondary hyperparathyroidism of renal origin: Secondary | ICD-10-CM | POA: Diagnosis not present

## 2023-08-09 DIAGNOSIS — N281 Cyst of kidney, acquired: Secondary | ICD-10-CM | POA: Diagnosis not present

## 2023-08-09 DIAGNOSIS — N200 Calculus of kidney: Secondary | ICD-10-CM | POA: Diagnosis not present

## 2023-08-09 DIAGNOSIS — E1122 Type 2 diabetes mellitus with diabetic chronic kidney disease: Secondary | ICD-10-CM | POA: Diagnosis not present

## 2023-08-09 DIAGNOSIS — N1831 Chronic kidney disease, stage 3a: Secondary | ICD-10-CM | POA: Diagnosis not present

## 2023-08-09 DIAGNOSIS — I129 Hypertensive chronic kidney disease with stage 1 through stage 4 chronic kidney disease, or unspecified chronic kidney disease: Secondary | ICD-10-CM | POA: Diagnosis not present

## 2023-08-10 ENCOUNTER — Other Ambulatory Visit: Payer: Self-pay | Admitting: Internal Medicine

## 2023-08-10 ENCOUNTER — Encounter: Payer: Self-pay | Admitting: Internal Medicine

## 2023-08-10 DIAGNOSIS — I10 Essential (primary) hypertension: Secondary | ICD-10-CM

## 2023-08-14 ENCOUNTER — Ambulatory Visit: Admitting: Internal Medicine

## 2023-08-14 ENCOUNTER — Other Ambulatory Visit (HOSPITAL_COMMUNITY): Payer: Self-pay

## 2023-08-14 ENCOUNTER — Telehealth: Payer: Self-pay

## 2023-08-14 ENCOUNTER — Encounter: Payer: Self-pay | Admitting: Internal Medicine

## 2023-08-14 VITALS — BP 92/62 | HR 84 | Temp 98.2°F | Ht 70.0 in | Wt 207.6 lb

## 2023-08-14 DIAGNOSIS — E538 Deficiency of other specified B group vitamins: Secondary | ICD-10-CM | POA: Diagnosis not present

## 2023-08-14 DIAGNOSIS — E1122 Type 2 diabetes mellitus with diabetic chronic kidney disease: Secondary | ICD-10-CM

## 2023-08-14 DIAGNOSIS — I10 Essential (primary) hypertension: Secondary | ICD-10-CM | POA: Diagnosis not present

## 2023-08-14 DIAGNOSIS — E785 Hyperlipidemia, unspecified: Secondary | ICD-10-CM

## 2023-08-14 DIAGNOSIS — R4189 Other symptoms and signs involving cognitive functions and awareness: Secondary | ICD-10-CM | POA: Diagnosis not present

## 2023-08-14 DIAGNOSIS — N183 Chronic kidney disease, stage 3 unspecified: Secondary | ICD-10-CM

## 2023-08-14 DIAGNOSIS — I9589 Other hypotension: Secondary | ICD-10-CM | POA: Diagnosis not present

## 2023-08-14 DIAGNOSIS — N179 Acute kidney failure, unspecified: Secondary | ICD-10-CM

## 2023-08-14 DIAGNOSIS — Z7985 Long-term (current) use of injectable non-insulin antidiabetic drugs: Secondary | ICD-10-CM | POA: Diagnosis not present

## 2023-08-14 MED ORDER — ATENOLOL 25 MG PO TABS: 25.0000 mg | ORAL_TABLET | Freq: Every day | ORAL | 3 refills | Status: AC

## 2023-08-14 MED ORDER — ROSUVASTATIN CALCIUM 20 MG PO TABS: 20.0000 mg | ORAL_TABLET | Freq: Every day | ORAL | 3 refills | Status: AC

## 2023-08-14 MED ORDER — SEMAGLUTIDE (1 MG/DOSE) 4 MG/3ML ~~LOC~~ SOPN
1.0000 mg | PEN_INJECTOR | SUBCUTANEOUS | 4 refills | Status: DC
Start: 2023-08-14 — End: 2023-09-15

## 2023-08-14 NOTE — Patient Instructions (Signed)
 VISIT SUMMARY:  You came in today for a follow-up on your kidney function and medication management. We discussed your recent blood work and urinalysis, your current medications, and your overall health, including your blood pressure, diabetes, cholesterol, and memory concerns.  YOUR PLAN:  -CHRONIC KIDNEY DISEASE: Chronic kidney disease means your kidneys are not working as well as they should. We are waiting for your lab results to decide if you can restart Jardiance . For now, continue to hold off on taking Jardiance  until we get further recommendations from your nephrologist.  -TYPE 2 DIABETES MELLITUS: Type 2 diabetes means your body has trouble using insulin properly. You are currently on 0.5 mg of Ozempic , and we recommend increasing the dose to 1.0 mg to help control your blood sugar, lose weight, and protect your heart. Be aware that this may cause nausea and vomiting. If these side effects are too much, you can go back to the 0.5 mg dose.  -HYPERTENSION: Hypertension means high blood pressure. Your recent readings have been low, so we recommend stopping atenolol  to reduce the number of pills you take. Monitor your blood pressure, and if it goes up, you can take 25 mg of atenolol  as needed.  -HYPERLIPIDEMIA: Hyperlipidemia means you have high cholesterol levels. We suggest increasing your rosuvastatin  dose from 10 mg to 20 mg to better manage your cholesterol and reduce your risk of heart disease. If you tolerate this well, we may increase it to 40 mg in the future.  -COGNITIVE IMPAIRMENT: Cognitive impairment means you are having trouble with memory and thinking. This may be due to vitamin deficiency and small blood vessel damage. We expect your memory to improve over the next two to three years with your current treatment, including B12 supplements. If needed, we can consider further neuropsychological evaluation.  -VITAMIN B12 DEFICIENCY: Vitamin B12 deficiency means you don't have enough  of this vitamin, which is important for nerve health and memory. Continue with your B12 injections and sublingual supplements to keep your levels high.  -GENERAL HEALTH MAINTENANCE: You have made significant lifestyle changes, such as quitting smoking and reducing alcohol consumption, which will improve your overall health. Keep up these good habits and engage in activities like puzzles to help improve your memory.  INSTRUCTIONS:  Please follow up in one month to assess how you are doing with the medication adjustments and your overall health.

## 2023-08-14 NOTE — Assessment & Plan Note (Signed)
 Receiving B12 injections and sublingual supplements. B12 levels are maintained at high levels, beneficial for nerve health and memory improvement. Continue B12 injections and sublingual supplements.

## 2023-08-14 NOTE — Assessment & Plan Note (Signed)
 Significant memory issues likely due to vitamin deficiency and microvascular damage. Optimism for improvement with ongoing treatment, including B12 supplementation and improved vascular health. Memory expected to improve over two to three years with consistent medication use. Continue current treatment regimen and consider neuropsychological evaluation if needed.

## 2023-08-14 NOTE — Telephone Encounter (Signed)
 Pharmacy Patient Advocate Encounter   Received notification from Onbase that prior authorization for Ozempic  (1 MG/DOSE) 4MG /3ML pen-injectors is required/requested.   Insurance verification completed.   The patient is insured through [facility] [place] .   Per test claim: PA required; PA submitted to above mentioned insurance via CoverMyMeds Key/confirmation #/EOC [id number] Status is pending

## 2023-08-14 NOTE — Assessment & Plan Note (Signed)
 Currently on 0.5 mg of Ozempic . The nephrologist recommends increasing to 1.0 mg for improved glycemic control, weight loss, and cardiovascular protection. Risks include nausea and vomiting, potentially leading to dehydration and worsening kidney function. Increased dose may also enhance memory and kidney protection. Increase Ozempic  to 1.0 mg and monitor for nausea and vomiting. If intolerable, revert to 0.5 mg dose.

## 2023-08-14 NOTE — Assessment & Plan Note (Signed)
 Recent nephrology consultation and extensive blood work and urinalysis suggest no active kidney damage. The nephrologist is considering restarting Jardiance  if lab results are favorable. Hold Jardiance  until advised otherwise and await nephrologist's lab results and recommendations.

## 2023-08-14 NOTE — Progress Notes (Signed)
 ==============================  [facility]: [phone]   -- Medical Office Visit --  Patient: Ralph Hurst      Age: 63 y.o.       Sex:  male  Date:   08/14/2023 Today's Healthcare Provider: Bernardino KANDICE Cone, MD  ==============================   Chief Complaint: Diabetes  Discussed the use of AI scribe software for clinical note transcription with the patient, who gave verbal consent to proceed.  History of Present Illness 63 year old male with chronic kidney disease who presents for follow-up on kidney function and medication management. He is accompanied by a friend.  He recently underwent extensive blood work and urinalysis to assess his kidney function, with results pending. He is currently off Jardiance  due to concerns about potential kidney damage, with the possibility of restarting it depending on lab results.  He has been on Ozempic  0.5 mg for a long time and is considering increasing the dose to 1.0 mg. He has experienced nausea and vomiting in the past with higher doses. He administers Ozempic  on Fridays and B12 injections on Mondays.  His blood pressure has been low, with recent readings of 92/62 mmHg. He has been taking atenolol  100 mg for many years, initially prescribed for blood pressure management, but is unsure of the exact reason. He also takes losartan . No dizziness or lightheadedness despite the low blood pressure. He has lost weight recently, which may have contributed to the decreased need for blood pressure medication.  He is on spironolactone  as a diuretic, which was confirmed to be continued by the kidney doctor. He is also taking rosuvastatin  10 mg for cholesterol management. He has been compliant with B12 supplementation, both through injections and sublingual forms, and reports no issues with administration.  He has a history of smoking and alcohol use but has quit smoking completely and significantly reduced alcohol  consumption, having only a few beers in the last month and a half. He reports a healthier diet. He is currently experiencing financial stress due to joblessness but is doing some under-the-table work to manage.  His memory has been a concern, with difficulties in recalling dates and performing memory tests. He has been referred to a neuropsychologist for further evaluation, but the initial report was inconclusive. He is open to further testing to assess his memory issues.   Background Reviewed: Problem List: has Hyperlipidemia; Tobacco use disorder; Arthralgia; Type 2 diabetes with stage 3 chronic kidney disease GFR 30-59 (HCC); Hypertension associated with diabetes (HCC); OSA (obstructive sleep apnea); Memory loss; Subacute combined degeneration (HCC); Macrocytic anemia; Obesity due to energy imbalance; Tick bite of abdomen; Allergic rhinitis; Cerebral ischemia; Persistent cognitive impairment, vascular and nutritional, disabling; Pernicious anemia; Complex renal cyst; Kidney stone; Hepatic steatosis; Vitamin D  deficiency; Long-term current use of injectable noninsulin antidiabetic medication; Macrocytosis associated with alcohol; Dizziness; Acute kidney injury superimposed on chronic kidney disease (HCC); Balance disorder; Cognitive impairment; and B12 deficiency on their problem list. Past Medical History:  has a past medical history of Diabetes mellitus without complication (HCC), Essential hypertension (09/14/2006), Hyperlipidemia, Hypertension, Need for immunization against influenza (10/11/2017), Polycythemia, Polycythemia, secondary (09/14/2006), Routine general medical examination at a health care facility (10/11/2017), and Sleep stage dysfunction (10/17/2016). Past Surgical History:   has no past surgical history on file. Social History:   reports that he has been smoking cigarettes. He has never used smokeless tobacco. He reports current alcohol use of about 12.0 standard drinks of alcohol per  week. He reports that he  does not use drugs. Family History:  family history includes Asthma in his father and mother; COPD in his brother and mother; Diabetes in his mother and sister; Hearing loss in his brother and mother; Hyperlipidemia in his father, mother, and sister; Hypertension in his brother, father, mother, and sister; Kidney disease in his mother; Sleep apnea in his father, mother, and son. Allergies:  has no known allergies.   Medication Reconciliation: Current Outpatient Medications on File Prior to Visit  Medication Sig   aspirin  EC 81 MG tablet Take 1 tablet (81 mg total) by mouth daily.   Blood Glucose Monitoring Suppl DEVI 1 each by Does not apply route in the morning, at noon, and at bedtime. May substitute to any manufacturer covered by patient's insurance.   buPROPion  ER (WELLBUTRIN  SR) 100 MG 12 hr tablet Take 1 tablet (100 mg total) by mouth in the morning.   Cholecalciferol (VITAMIN D3) 50 MCG (2000 UT) capsule Take 1 capsule (2,000 Units total) by mouth daily.   Cyanocobalamin  (B-12 COMPLIANCE INJECTION) 1000 MCG/ML KIT Inject 1,000 mg as directed once a week.   Cyanocobalamin  (B-12) 1000 MCG SUBL Place 1 tablet under the tongue daily at 6 (six) AM.   empagliflozin  (JARDIANCE ) 10 MG TABS tablet Take 1 tablet (10 mg total) by mouth daily before breakfast.   fexofenadine (ALLEGRA ALLERGY) 180 MG tablet Take 180 mg by mouth daily.   Lancets (ONETOUCH ULTRASOFT) lancets Use once daily. Dx E11.9   losartan  (COZAAR ) 100 MG tablet Take 1 tablet by mouth once daily   NEEDLE, DISP, 25 G (B-D DISP NEEDLE 25GX1) 25G X 1 MISC Use to give with B 12   Omega-3 Fatty Acids (FISH OIL) 1200 MG CAPS Take by mouth.   spironolactone  (ALDACTONE ) 50 MG tablet Take 1 tablet (50 mg total) by mouth daily.   SYRINGE-NEEDLE, DISP, 3 ML (LUER LOCK SAFETY SYRINGES) 25G X 1 3 ML MISC Use to take B 12   Current Facility-Administered Medications on File Prior to Visit  Medication   cyanocobalamin   (VITAMIN B12) injection 1,000 mcg   cyanocobalamin  (VITAMIN B12) injection 1,000 mcg   Medications Discontinued During This Encounter  Medication Reason   Semaglutide ,0.25 or 0.5MG /DOS, (OZEMPIC , 0.25 OR 0.5 MG/DOSE,) 2 MG/3ML SOPN Dose change   atenolol  (TENORMIN ) 100 MG tablet    rosuvastatin  (CRESTOR ) 10 MG tablet      Physical Exam:    08/14/2023   10:29 AM 07/26/2023    2:22 PM 07/25/2023   10:49 AM  Vitals with BMI  Height 5' 10 5' 10 5' 10  Weight 207 lbs 10 oz 210 lbs 211 lbs 6 oz  BMI 29.79 30.13 30.33  Systolic 92 116 118  Diastolic 62 79 64  Pulse 84 76 71  Vital signs reviewed.  Nursing notes reviewed. Weight trend reviewed. Physical Exam General Appearance:  No acute distress appreciable.   Well-groomed, healthy-appearing male.  Well proportioned with no abnormal fat distribution.  Good muscle tone. Pulmonary:  Normal work of breathing at rest, no respiratory distress apparent. SpO2: 98 %  Musculoskeletal: All extremities are intact.  Neurological:  Awake, alert, oriented, and engaged.  No obvious focal neurological deficits or cognitive impairments.  Sensorium seems unclouded.   Speech is clear and coherent with logical content. Psychiatric:  Appropriate mood, pleasant and cooperative demeanor, thoughtful and engaged during the exam     08/14/2023   10:53 AM  6CIT Screen  What Year? 4 points  What month? 3 points  What time? 0 points  Count back from 20 2 points  Months in reverse 4 points  Repeat phrase 10 points  Total Score 23 points      Results:    02/14/2023   10:49 AM 01/19/2023   11:05 AM 12/12/2022    8:59 AM 12/01/2022    8:28 AM  PHQ 2/9 Scores  PHQ - 2 Score 0 0 0 0  PHQ- 9 Score 2   0   Results     No results found for any visits on 08/14/23. Office Visit on 07/26/2023  Component Date Value Ref Range Status   Specific Gravity, UA 07/26/2023 1.020  1.005 - 1.030 Final   pH, UA 07/26/2023 5.5  5.0 - 7.5 Final   Color, UA 07/26/2023  Yellow  Yellow Final   Appearance Ur 07/26/2023 Clear  Clear Final   Leukocytes,UA 07/26/2023 Negative  Negative Final   Protein,UA 07/26/2023 Negative  Negative/Trace Final   Glucose, UA 07/26/2023 Negative  Negative Final   Ketones, UA 07/26/2023 Trace (A)  Negative Final   RBC, UA 07/26/2023 Negative  Negative Final   Bilirubin, UA 07/26/2023 Negative  Negative Final   Urobilinogen, Ur 07/26/2023 1.0  0.2 - 1.0 mg/dL Final   Nitrite, UA 93/81/7974 Negative  Negative Final   Microscopic Examination 07/26/2023 See below:   Final   WBC, UA 07/26/2023 0-5  0 - 5 /hpf Final   RBC, Urine 07/26/2023 0-2  0 - 2 /hpf Final   Epithelial Cells (non renal) 07/26/2023 0-10  0 - 10 /hpf Final   Casts 07/26/2023 Present  None seen /lpf Final   Cast Type 07/26/2023 Hyaline casts  N/A Final   Mucus, UA 07/26/2023 Present (A)  Not Estab. Final   Bacteria, UA 07/26/2023 Few  None seen/Few Final  Office Visit on 07/05/2023  Component Date Value Ref Range Status   Glucose, Bld 07/05/2023 115 (H)  65 - 99 mg/dL Final   BUN 94/71/7974 25  7 - 25 mg/dL Final   Creat 94/71/7974 1.58 (H)  0.70 - 1.35 mg/dL Final   BUN/Creatinine Ratio 07/05/2023 16  6 - 22 (calc) Final   Sodium 07/05/2023 139  135 - 146 mmol/L Final   Potassium 07/05/2023 4.1  3.5 - 5.3 mmol/L Final   Chloride 07/05/2023 104  98 - 110 mmol/L Final   CO2 07/05/2023 24  20 - 32 mmol/L Final   Calcium  07/05/2023 10.0  8.6 - 10.3 mg/dL Final   Microalb, Ur 94/71/7974 <0.7  mg/dL Final   Creatinine,U 94/71/7974 113.2  mg/dL Final   Microalb Creat Ratio 07/05/2023 Unable to calculate  0.0 - 30.0 mg/g Final   CYSTATIN C 07/05/2023 2.08 (H)  0.52 - 1.20 mg/L Final   eGFR 07/05/2023 29 (L)  >=60 mL/min/1.54m2 Final  Office Visit on 06/19/2023  Component Date Value Ref Range Status   Creatinine, Urine 06/19/2023 106  20 - 320 mg/dL Final   Protein/Creat Ratio 06/19/2023 104  25 - 148 mg/g creat Final   Protein/Creatinine Ratio 06/19/2023 0.104   0.025 - 0.148 mg/mg creat Final   Total Protein, Urine 06/19/2023 11  5 - 25 mg/dL Final   Color, Urine 94/87/7974 YELLOW  YELLOW Final   APPearance 06/19/2023 CLEAR  CLEAR Final   Specific Gravity, Urine 06/19/2023 1.026  1.001 - 1.035 Final   pH 06/19/2023 5.5  5.0 - 8.0 Final   Glucose, UA 06/19/2023 3+ (A)  NEGATIVE Final   Bilirubin Urine 06/19/2023 NEGATIVE  NEGATIVE Final   Ketones, ur 06/19/2023 NEGATIVE  NEGATIVE Final   Hgb urine dipstick 06/19/2023 NEGATIVE  NEGATIVE Final   Protein, ur 06/19/2023 NEGATIVE  NEGATIVE Final   Nitrites, Initial 06/19/2023 NEGATIVE  NEGATIVE Final   Leukocyte Esterase 06/19/2023 NEGATIVE  NEGATIVE Final   WBC, UA 06/19/2023 NONE SEEN  0 - 5 /HPF Final   RBC / HPF 06/19/2023 NONE SEEN  0 - 2 /HPF Final   Squamous Epithelial / HPF 06/19/2023 NONE SEEN  < OR = 5 /HPF Final   Bacteria, UA 06/19/2023 NONE SEEN  NONE SEEN /HPF Final   Hyaline Cast 06/19/2023 NONE SEEN  NONE SEEN /LPF Final   Note 06/19/2023    Final   Sodium 06/19/2023 135  135 - 145 mEq/L Final   Potassium 06/19/2023 4.2  3.5 - 5.1 mEq/L Final   Chloride 06/19/2023 103  96 - 112 mEq/L Final   CO2 06/19/2023 25  19 - 32 mEq/L Final   Glucose, Bld 06/19/2023 115 (H)  70 - 99 mg/dL Final   BUN 94/87/7974 23  6 - 23 mg/dL Final   Creatinine, Ser 06/19/2023 1.58 (H)  0.40 - 1.50 mg/dL Final   GFR 94/87/7974 46.24 (L)  >60.00 mL/min Final   Calcium  06/19/2023 9.3  8.4 - 10.5 mg/dL Final   Microalb, Ur 94/87/7974 <0.7  mg/dL Final   Creatinine,U 94/87/7974 105.6  mg/dL Final   Microalb Creat Ratio 06/19/2023 Unable to calculate  0.0 - 30.0 mg/g Final   Reflexve Urine Culture 06/19/2023    Final  Office Visit on 05/19/2023  Component Date Value Ref Range Status   WBC 05/19/2023 7.5  4.0 - 10.5 K/uL Final   RBC 05/19/2023 4.69  4.22 - 5.81 Mil/uL Final   Hemoglobin 05/19/2023 15.9  13.0 - 17.0 g/dL Final   HCT 95/88/7974 45.9  39.0 - 52.0 % Final   MCV 05/19/2023 97.8  78.0 - 100.0 fl  Final   MCHC 05/19/2023 34.7  30.0 - 36.0 g/dL Final   RDW 95/88/7974 11.8  11.5 - 15.5 % Final   Platelets 05/19/2023 221.0  150.0 - 400.0 K/uL Final   Neutrophils Relative % 05/19/2023 70.5  43.0 - 77.0 % Final   Lymphocytes Relative 05/19/2023 18.9  12.0 - 46.0 % Final   Monocytes Relative 05/19/2023 6.4  3.0 - 12.0 % Final   Eosinophils Relative 05/19/2023 3.4  0.0 - 5.0 % Final   Basophils Relative 05/19/2023 0.8  0.0 - 3.0 % Final   Neutro Abs 05/19/2023 5.3  1.4 - 7.7 K/uL Final   Lymphs Abs 05/19/2023 1.4  0.7 - 4.0 K/uL Final   Monocytes Absolute 05/19/2023 0.5  0.1 - 1.0 K/uL Final   Eosinophils Absolute 05/19/2023 0.3  0.0 - 0.7 K/uL Final   Basophils Absolute 05/19/2023 0.1  0.0 - 0.1 K/uL Final   Sodium 05/19/2023 137  135 - 145 mEq/L Final   Potassium 05/19/2023 4.7  3.5 - 5.1 mEq/L Final   Chloride 05/19/2023 101  96 - 112 mEq/L Final   CO2 05/19/2023 28  19 - 32 mEq/L Final   Glucose, Bld 05/19/2023 176 (H)  70 - 99 mg/dL Final   BUN 95/88/7974 20  6 - 23 mg/dL Final   Creatinine, Ser 05/19/2023 1.37  0.40 - 1.50 mg/dL Final   Total Bilirubin 05/19/2023 0.5  0.2 - 1.2 mg/dL Final   Alkaline Phosphatase 05/19/2023 55  39 - 117 U/L Final   AST 05/19/2023 18  0 -  37 U/L Final   ALT 05/19/2023 25  0 - 53 U/L Final   Total Protein 05/19/2023 6.6  6.0 - 8.3 g/dL Final   Albumin 95/88/7974 4.5  3.5 - 5.2 g/dL Final   GFR 95/88/7974 54.90 (L)  >60.00 mL/min Final   Calcium  05/19/2023 9.6  8.4 - 10.5 mg/dL Final   Cholesterol 95/88/7974 91  0 - 200 mg/dL Final   Triglycerides 95/88/7974 275.0 (H)  0.0 - 149.0 mg/dL Final   HDL 95/88/7974 25.60 (L)  >60.99 mg/dL Final   VLDL 95/88/7974 55.0 (H)  0.0 - 40.0 mg/dL Final   LDL Cholesterol 05/19/2023 11  0 - 99 mg/dL Final   Total CHOL/HDL Ratio 05/19/2023 4   Final   NonHDL 05/19/2023 65.55   Final   Hgb A1c MFr Bld 05/19/2023 6.8 (H)  4.6 - 6.5 % Final   Microalb, Ur 05/19/2023 <0.7  mg/dL Final   Creatinine,U 95/88/7974  146.0  mg/dL Final   Microalb Creat Ratio 05/19/2023 Unable to calculate  0.0 - 30.0 mg/g Final   Vitamin B-12 05/19/2023 >1537 (H)  211 - 911 pg/mL Final   Folate 05/19/2023 14.0  >5.9 ng/mL Final   Intrinsic Factor 05/19/2023 Positive (A)  Negative Final   VITD 05/19/2023 23.09 (L)  30.00 - 100.00 ng/mL Final  Office Visit on 12/01/2022  Component Date Value Ref Range Status   Sodium 12/01/2022 136  135 - 145 mEq/L Final   Potassium 12/01/2022 4.1  3.5 - 5.1 mEq/L Final   Chloride 12/01/2022 100  96 - 112 mEq/L Final   CO2 12/01/2022 28  19 - 32 mEq/L Final   Glucose, Bld 12/01/2022 264 (H)  70 - 99 mg/dL Final   BUN 89/75/7975 18  6 - 23 mg/dL Final   Creatinine, Ser 12/01/2022 1.41  0.40 - 1.50 mg/dL Final   Total Bilirubin 12/01/2022 0.6  0.2 - 1.2 mg/dL Final   Alkaline Phosphatase 12/01/2022 53  39 - 117 U/L Final   AST 12/01/2022 18  0 - 37 U/L Final   ALT 12/01/2022 21  0 - 53 U/L Final   Total Protein 12/01/2022 6.7  6.0 - 8.3 g/dL Final   Albumin 89/75/7975 4.2  3.5 - 5.2 g/dL Final   GFR 89/75/7975 53.21 (L)  >60.00 mL/min Final   Calcium  12/01/2022 9.4  8.4 - 10.5 mg/dL Final   Hgb J8r MFr Bld 12/01/2022 6.8 (H)  4.6 - 6.5 % Final   WBC 12/01/2022 6.1  4.0 - 10.5 K/uL Final   RBC 12/01/2022 4.53  4.22 - 5.81 Mil/uL Final   Platelets 12/01/2022 197.0  150.0 - 400.0 K/uL Final   Hemoglobin 12/01/2022 15.3  13.0 - 17.0 g/dL Final   HCT 89/75/7975 45.9  39.0 - 52.0 % Final   MCV 12/01/2022 101.4 (H)  78.0 - 100.0 fl Final   MCHC 12/01/2022 33.4  30.0 - 36.0 g/dL Final   RDW 89/75/7975 12.5  11.5 - 15.5 % Final   Vitamin B-12 12/01/2022 210 (L)  211 - 911 pg/mL Final  Office Visit on 06/01/2022  Component Date Value Ref Range Status   PSA 06/01/2022 0.69  0.10 - 4.00 ng/mL Final  Lab on 05/25/2022  Component Date Value Ref Range Status   TSH 05/25/2022 1.67  0.35 - 5.50 uIU/mL Final   Hgb A1c MFr Bld 05/25/2022 6.5  4.6 - 6.5 % Final   WBC 05/25/2022 8.3  4.0 - 10.5  K/uL Final   RBC 05/25/2022 4.35  4.22 - 5.81 Mil/uL Final   Platelets 05/25/2022 206.0  150.0 - 400.0 K/uL Final   Hemoglobin 05/25/2022 14.9  13.0 - 17.0 g/dL Final   HCT 95/82/7975 42.1  39.0 - 52.0 % Final   MCV 05/25/2022 96.9  78.0 - 100.0 fl Final   MCHC 05/25/2022 35.3  30.0 - 36.0 g/dL Final   RDW 95/82/7975 12.9  11.5 - 15.5 % Final   Sodium 05/25/2022 139  135 - 145 mEq/L Final   Potassium 05/25/2022 4.5  3.5 - 5.1 mEq/L Final   Chloride 05/25/2022 104  96 - 112 mEq/L Final   CO2 05/25/2022 27  19 - 32 mEq/L Final   Glucose, Bld 05/25/2022 130 (H)  70 - 99 mg/dL Final   BUN 95/82/7975 15  6 - 23 mg/dL Final   Creatinine, Ser 05/25/2022 1.25  0.40 - 1.50 mg/dL Final   Total Bilirubin 05/25/2022 0.5  0.2 - 1.2 mg/dL Final   Alkaline Phosphatase 05/25/2022 48  39 - 117 U/L Final   AST 05/25/2022 19  0 - 37 U/L Final   ALT 05/25/2022 26  0 - 53 U/L Final   Total Protein 05/25/2022 6.3  6.0 - 8.3 g/dL Final   Albumin 95/82/7975 4.2  3.5 - 5.2 g/dL Final   GFR 95/82/7975 61.71  >60.00 mL/min Final   Calcium  05/25/2022 9.1  8.4 - 10.5 mg/dL Final   Cholesterol 95/82/7975 97  0 - 200 mg/dL Final   Triglycerides 95/82/7975 151.0 (H)  0.0 - 149.0 mg/dL Final   HDL 95/82/7975 30.80 (L)  >60.99 mg/dL Final   VLDL 95/82/7975 30.2  0.0 - 40.0 mg/dL Final   LDL Cholesterol 05/25/2022 36  0 - 99 mg/dL Final   Total CHOL/HDL Ratio 05/25/2022 3   Final   NonHDL 05/25/2022 66.39   Final  No image results found. MR Abdomen W Wo Contrast Result Date: 07/23/2023 CLINICAL DATA:  Renal mass/cyst on ultrasound EXAM: MRI ABDOMEN WITHOUT AND WITH CONTRAST TECHNIQUE: Multiplanar multisequence MR imaging of the abdomen was performed both before and after the administration of intravenous contrast. CONTRAST:  10 mL Vueway  IV COMPARISON:  Renal ultrasound dated 05/26/2023 FINDINGS: Lower chest: Lung bases are clear. Hepatobiliary: Liver is within normal limits.  No hepatic steatosis. Gallbladder is  unremarkable. No intrahepatic or extrahepatic ductal dilatation. Pancreas:  Within normal limits. Spleen:  Within normal limits. Adrenals/Urinary Tract:  Adrenal glands are within normal limits. Simple bilateral renal cysts measuring up to 16 mm in the left lower kidney (series 4/image 33), benign (Bosniak I). Mildly complex/septated right upper pole renal cyst measuring 3.0 cm (series 4/image 25), corresponding to the sonographic abnormality, benign (Bosniak II). No follow-up is recommended. No hydronephrosis. Stomach/Bowel: Stomach is within normal limits. Visualized bowel is grossly unremarkable. Vascular/Lymphatic:  No evidence of abdominal aortic aneurysm. No suspicious abdominal lymphadenopathy. Other:  No abdominal ascites. Musculoskeletal: No focal osseous lesions. IMPRESSION: Mildly complex/septated right upper pole renal cyst measuring 3.0 cm, corresponding to the sonographic abnormality, benign (Bosniak II). No follow-up is recommended. Additional simple bilateral renal cysts, benign (Bosniak I). Electronically Signed   By: Pinkie Pebbles M.D.   On: 07/23/2023 01:22   US  Renal Result Date: 06/03/2023 CLINICAL DATA:  ckd3, fh kidney dz EXAM: RENAL / URINARY TRACT ULTRASOUND COMPLETE COMPARISON:  None Available. FINDINGS: Right Kidney: Renal measurements: 12.2 x 5.9 x 4.5 cm = volume: 170 mL. Echogenicity within normal limits. No hydronephrosis visualized. In the superior pole of the RIGHT kidney, there is  an anechoic mass with several prominent internal septations which measures 3.4 x 2.3 x 2.5 cm. Echogenic focus with posterior acoustic shadowing in the inferior pole measuring up to 5 mm. Left Kidney: Renal measurements: 11.8 x 6.1 x 5.2 cm = volume: 195 mL. Echogenicity within normal limits. No mass or hydronephrosis visualized. Bladder: Appears normal for degree of bladder distention. Other: Increased hepatic echogenicity. IMPRESSION: 1. There is a 3.4 cm septated cyst in the superior pole of the  RIGHT kidney. This likely reflects a mildly complicated cyst. Recommend further evaluation with dedicated renal protocol CT or MRI with and without contrast. 2. Nonobstructive RIGHT nephrolithiasis versus vascular calcification. 3. Increased hepatic echogenicity as can be seen in hepatic steatosis. These results will be called to the ordering clinician or representative by the Radiologist Assistant, and communication documented in the PACS or [employer]. Electronically Signed   By: Corean Salter M.D.   On: 06/03/2023 11:18         ASSESSMENT & PLAN   Assessment & Plan Acute kidney injury superimposed on chronic kidney disease [facility]) Recent nephrology consultation and extensive blood work and urinalysis suggest no active kidney damage. The nephrologist is considering restarting Jardiance  if lab results are favorable. Hold Jardiance  until advised otherwise and await nephrologist's lab results and recommendations. Type 2 diabetes mellitus with stage 3 chronic kidney disease, without long-term current use of insulin, unspecified whether stage 3a or 3b CKD (HCC) Currently on 0.5 mg of Ozempic . The nephrologist recommends increasing to 1.0 mg for improved glycemic control, weight loss, and cardiovascular protection. Risks include nausea and vomiting, potentially leading to dehydration and worsening kidney function. Increased dose may also enhance memory and kidney protection. Increase Ozempic  to 1.0 mg and monitor for nausea and vomiting. If intolerable, revert to 0.5 mg dose. Other specified hypotension Likely due to weight loss needs less blood pressure medications, will reduce. Primary hypertension Blood pressure is low at 92/62 mmHg. Currently on atenolol  and losartan . Atenolol  is not typically used solely for hypertension, and he has no history of palpitations or atrial fibrillation. Weight loss may have reduced the need for antihypertensive medication. Discontinuation of atenolol  is advised  to reduce pill burden and due to low blood pressure readings. Discontinue atenolol  100 mg and monitor blood pressure. If blood pressure increases, consider atenolol  25 mg as needed. Hyperlipidemia, unspecified hyperlipidemia type Currently on 10 mg of rosuvastatin . Plan to increase to 20 mg to improve cholesterol levels and reduce cardiovascular risk, with a goal of 40 mg if tolerated. Increasing rosuvastatin  improves arterial health, reducing stroke and heart attack risk. Increase rosuvastatin  to 20 mg and monitor for tolerance. Consider further increase to 40 mg if tolerated. Cognitive impairment Significant memory issues likely due to vitamin deficiency and microvascular damage. Optimism for improvement with ongoing treatment, including B12 supplementation and improved vascular health. Memory expected to improve over two to three years with consistent medication use. Continue current treatment regimen and consider neuropsychological evaluation if needed. B12 deficiency Receiving B12 injections and sublingual supplements. B12 levels are maintained at high levels, beneficial for nerve health and memory improvement. Continue B12 injections and sublingual supplements.  General Health Maintenance   Significant lifestyle changes include quitting smoking and reducing alcohol consumption, expected to improve overall health and vascular function, potentially extending lifespan and enhancing cognitive function. Encourage continued abstinence from smoking and alcohol. Engage in cognitive activities such as puzzles to improve memory.  Follow-up   Requires close monitoring due to recent medication adjustments and ongoing  health issues. Follow up in one month to assess medication adjustments and health status.  ORDER ASSOCIATIONS  #   DIAGNOSIS / CONDITION ICD-10 ENCOUNTER ORDER     ICD-10-CM   1. Acute kidney injury superimposed on chronic kidney disease (HCC)  N17.9    N18.9     2. Type 2 diabetes  mellitus with stage 3 chronic kidney disease, without long-term current use of insulin, unspecified whether stage 3a or 3b CKD (HCC)  E11.22 Semaglutide , 1 MG/DOSE, 4 MG/3ML SOPN   N18.30 rosuvastatin  (CRESTOR ) 20 MG tablet    3. Other specified hypotension  I95.89     4. Primary hypertension  I10 atenolol  (TENORMIN ) 25 MG tablet    5. Hyperlipidemia, unspecified hyperlipidemia type  E78.5 rosuvastatin  (CRESTOR ) 20 MG tablet          Orders Placed in Encounter:   Meds ordered this encounter  Medications   Semaglutide , 1 MG/DOSE, 4 MG/3ML SOPN    Sig: Inject 1 mg as directed once a week.    Dispense:  3 mL    Refill:  4   atenolol  (TENORMIN ) 25 MG tablet    Sig: Take 1 tablet (25 mg total) by mouth daily.    Dispense:  90 tablet    Refill:  3   rosuvastatin  (CRESTOR ) 20 MG tablet    Sig: Take 1 tablet (20 mg total) by mouth daily.    Dispense:  90 tablet    Refill:  3        This document was synthesized by artificial intelligence ([employer]) using HIPAA-compliant recording of the clinical interaction;   We discussed the use of AI scribe software for clinical note transcription with the patient, who gave verbal consent to proceed. additional Info: This encounter employed state-of-the-art, real-time, collaborative documentation. The patient actively reviewed and assisted in updating their electronic medical record on a shared screen, ensuring transparency and facilitating joint problem-solving for the problem list, overview, and plan. This approach promotes accurate, informed care. The treatment plan was discussed and reviewed in detail, including medication safety, potential side effects, and all patient questions. We confirmed understanding and comfort with the plan. Follow-up instructions were established, including contacting the office for any concerns, returning if symptoms worsen, persist, or new symptoms develop, and precautions for potential emergency department visits.

## 2023-08-14 NOTE — Assessment & Plan Note (Signed)
 Currently on 10 mg of rosuvastatin . Plan to increase to 20 mg to improve cholesterol levels and reduce cardiovascular risk, with a goal of 40 mg if tolerated. Increasing rosuvastatin  improves arterial health, reducing stroke and heart attack risk. Increase rosuvastatin  to 20 mg and monitor for tolerance. Consider further increase to 40 mg if tolerated.

## 2023-08-15 NOTE — Telephone Encounter (Signed)
 Pharmacy Patient Advocate Encounter  Received notification from [facility] [employer] that Prior Authorization for Ozempic  (1 MG/DOSE) 4MG /3ML pen-injectors has been APPROVED from 07/31/23 to 08/13/24   PA #/Case ID/Reference #: [id number]

## 2023-08-17 ENCOUNTER — Other Ambulatory Visit (HOSPITAL_COMMUNITY): Payer: Self-pay

## 2023-08-19 DIAGNOSIS — Z419 Encounter for procedure for purposes other than remedying health state, unspecified: Secondary | ICD-10-CM | POA: Diagnosis not present

## 2023-08-29 ENCOUNTER — Other Ambulatory Visit (HOSPITAL_COMMUNITY): Payer: Self-pay

## 2023-09-08 DIAGNOSIS — G4733 Obstructive sleep apnea (adult) (pediatric): Secondary | ICD-10-CM | POA: Diagnosis not present

## 2023-09-13 ENCOUNTER — Encounter: Payer: Self-pay | Admitting: Internal Medicine

## 2023-09-15 ENCOUNTER — Encounter: Payer: Self-pay | Admitting: Internal Medicine

## 2023-09-15 ENCOUNTER — Ambulatory Visit: Admitting: Internal Medicine

## 2023-09-15 VITALS — BP 108/62 | HR 70 | Temp 98.0°F | Ht 70.0 in | Wt 202.6 lb

## 2023-09-15 DIAGNOSIS — E782 Mixed hyperlipidemia: Secondary | ICD-10-CM

## 2023-09-15 DIAGNOSIS — E1122 Type 2 diabetes mellitus with diabetic chronic kidney disease: Secondary | ICD-10-CM | POA: Diagnosis not present

## 2023-09-15 DIAGNOSIS — Z7985 Long-term (current) use of injectable non-insulin antidiabetic drugs: Secondary | ICD-10-CM

## 2023-09-15 DIAGNOSIS — I10 Essential (primary) hypertension: Secondary | ICD-10-CM | POA: Diagnosis not present

## 2023-09-15 DIAGNOSIS — E538 Deficiency of other specified B group vitamins: Secondary | ICD-10-CM

## 2023-09-15 DIAGNOSIS — G4733 Obstructive sleep apnea (adult) (pediatric): Secondary | ICD-10-CM | POA: Diagnosis not present

## 2023-09-15 DIAGNOSIS — N183 Chronic kidney disease, stage 3 unspecified: Secondary | ICD-10-CM

## 2023-09-15 DIAGNOSIS — M7541 Impingement syndrome of right shoulder: Secondary | ICD-10-CM

## 2023-09-15 DIAGNOSIS — E669 Obesity, unspecified: Secondary | ICD-10-CM | POA: Diagnosis not present

## 2023-09-15 DIAGNOSIS — G320 Subacute combined degeneration of spinal cord in diseases classified elsewhere: Secondary | ICD-10-CM | POA: Diagnosis not present

## 2023-09-15 MED ORDER — BD DISP NEEDLE 25G X 1" MISC
2 refills | Status: AC
Start: 2023-09-15 — End: ?

## 2023-09-15 MED ORDER — B-12 COMPLIANCE INJECTION 1000 MCG/ML IJ KIT: 1000.0000 mg | PACK | INTRAMUSCULAR | 3 refills | Status: AC

## 2023-09-15 MED ORDER — LOSARTAN POTASSIUM 50 MG PO TABS: 50.0000 mg | ORAL_TABLET | Freq: Every day | ORAL | 3 refills | Status: AC

## 2023-09-15 MED ORDER — SEMAGLUTIDE (1 MG/DOSE) 4 MG/3ML ~~LOC~~ SOPN
1.0000 mg | PEN_INJECTOR | SUBCUTANEOUS | 4 refills | Status: DC
Start: 2023-09-15 — End: 2023-10-17

## 2023-09-15 MED ORDER — LUER LOCK SAFETY SYRINGES 25G X 1" 3 ML MISC: 2 refills | Status: AC

## 2023-09-15 MED ORDER — SPIRONOLACTONE 25 MG PO TABS: 25.0000 mg | ORAL_TABLET | Freq: Every day | ORAL | 3 refills | Status: AC

## 2023-09-15 NOTE — Progress Notes (Signed)
 ==============================  [facility]: [phone]   -- Medical Office Visit --  Patient: Ralph Hurst      Age: 63 y.o.       Sex:  male  Date:   09/15/2023 Today's Healthcare Provider: Bernardino KANDICE Cone, MD  ==============================   Chief Complaint: Chronic Kidney Disease   Discussed the use of AI scribe software for clinical note transcription with the patient, who gave verbal consent to proceed.  History of Present Illness 63 year old male with hypertension who presents for a follow-up visit regarding weight management and blood pressure control.  He has been on 0.5 mg of Ozempic  for several months, which has significantly reduced his appetite and contributed to a weight loss of 35 pounds since February 14, 2023. He started at 237 pounds and has been seeing the provider regularly, approximately every month. He wants to increase physical activity but has been limited by weather conditions. He is considering starting exercises with a TRX system due to concerns about resistance bands damaging his doors.  He experiences shoulder pain when reaching up quickly, describing it as 'hurts like hell' and noting a limited range of motion. He suspects a partial tear in the rotator cuff but does not find it bothersome enough to seek further evaluation at this time.  He has been experiencing dizziness and shaking, particularly when bending over and standing up quickly. He is currently taking losartan  100 mg, which was increased due to previous higher weight. He also takes spironolactone . He has been monitoring his blood pressure at home.  He previously stopped taking Jardiance  after consulting with a kidney specialist, who agreed with the decision. He had extensive blood work done in July, which was reviewed by his kidney doctor.  He has been experiencing fatigue and occasionally removes his CPAP during sleep without realizing it. He acknowledges  this issue and has been trying to address it by putting the CPAP back on when he wakes up without it.   Wt Readings from Last 10 Encounters:  09/15/23 202 lb 9.6 oz (91.9 kg)  08/14/23 207 lb 9.6 oz (94.2 kg)  07/26/23 210 lb (95.3 kg)  07/25/23 211 lb 6.4 oz (95.9 kg)  07/05/23 215 lb (97.5 kg)  06/19/23 219 lb 6.4 oz (99.5 kg)  05/19/23 224 lb 9.6 oz (101.9 kg)  05/03/23 226 lb (102.5 kg)  03/08/23 233 lb 3.2 oz (105.8 kg)  02/14/23 237 lb (107.5 kg)   Background Reviewed: Problem List: has Hyperlipidemia; Tobacco use disorder; Arthralgia; Type 2 diabetes with stage 3 chronic kidney disease GFR 30-59 (HCC); Hypertension associated with diabetes (HCC); OSA (obstructive sleep apnea); Memory loss; Subacute combined degeneration (HCC); Macrocytic anemia; Obesity due to energy imbalance; Tick bite of abdomen; Allergic rhinitis; Cerebral ischemia; Persistent cognitive impairment, vascular and nutritional, disabling; Pernicious anemia; Complex renal cyst; Kidney stone; Hepatic steatosis; Vitamin D  deficiency; Long-term current use of injectable noninsulin antidiabetic medication; Macrocytosis associated with alcohol; Dizziness; Acute kidney injury superimposed on chronic kidney disease (HCC); Balance disorder; Cognitive impairment; and B12 deficiency on their problem list. Past Medical History:  has a past medical history of Diabetes mellitus without complication (HCC), Essential hypertension (09/14/2006), Hyperlipidemia, Hypertension, Need for immunization against influenza (10/11/2017), Polycythemia, Polycythemia, secondary (09/14/2006), Routine general medical examination at a health care facility (10/11/2017), and Sleep stage dysfunction (10/17/2016). Past Surgical History:   has no past surgical history on file. Social History:   reports that he has been smoking cigarettes. He has  never used smokeless tobacco. He reports current alcohol use of about 12.0 standard drinks of alcohol per week. He  reports that he does not use drugs. Family History:  family history includes Asthma in his father and mother; COPD in his brother and mother; Diabetes in his mother and sister; Hearing loss in his brother and mother; Hyperlipidemia in his father, mother, and sister; Hypertension in his brother, father, mother, and sister; Kidney disease in his mother; Sleep apnea in his father, mother, and son. Allergies:  has no known allergies.   Medication Reconciliation: Current Outpatient Medications on File Prior to Visit  Medication Sig   aspirin  EC 81 MG tablet Take 1 tablet (81 mg total) by mouth daily.   Blood Glucose Monitoring Suppl DEVI 1 each by Does not apply route in the morning, at noon, and at bedtime. May substitute to any manufacturer covered by patient's insurance.   buPROPion  ER (WELLBUTRIN  SR) 100 MG 12 hr tablet Take 1 tablet (100 mg total) by mouth in the morning.   Cholecalciferol (VITAMIN D3) 50 MCG (2000 UT) capsule Take 1 capsule (2,000 Units total) by mouth daily.   Cyanocobalamin  (B-12) 1000 MCG SUBL Place 1 tablet under the tongue daily at 6 (six) AM.   Omega-3 Fatty Acids (FISH OIL) 1200 MG CAPS Take by mouth.   rosuvastatin  (CRESTOR ) 20 MG tablet Take 1 tablet (20 mg total) by mouth daily.   spironolactone  (ALDACTONE ) 50 MG tablet Take 1 tablet (50 mg total) by mouth daily.   atenolol  (TENORMIN ) 25 MG tablet Take 1 tablet (25 mg total) by mouth daily.   fexofenadine (ALLEGRA ALLERGY) 180 MG tablet Take 180 mg by mouth daily.   Lancets (ONETOUCH ULTRASOFT) lancets Use once daily. Dx E11.9   Current Facility-Administered Medications on File Prior to Visit  Medication   cyanocobalamin  (VITAMIN B12) injection 1,000 mcg   cyanocobalamin  (VITAMIN B12) injection 1,000 mcg   Medications Discontinued During This Encounter  Medication Reason   Cyanocobalamin  (B-12 COMPLIANCE INJECTION) 1000 MCG/ML KIT Reorder   NEEDLE, DISP, 25 G (B-D DISP NEEDLE 25GX1) 25G X 1 MISC Reorder    SYRINGE-NEEDLE, DISP, 3 ML (LUER LOCK SAFETY SYRINGES) 25G X 1 3 ML MISC Reorder   losartan  (COZAAR ) 100 MG tablet Dose change   empagliflozin  (JARDIANCE ) 10 MG TABS tablet Completed Course   Semaglutide , 1 MG/DOSE, 4 MG/3ML SOPN Reorder     Physical Exam:    09/15/2023    9:53 AM 08/14/2023   10:29 AM 07/26/2023    2:22 PM  Vitals with BMI  Height 5' 10 5' 10 5' 10  Weight 202 lbs 10 oz 207 lbs 10 oz 210 lbs  BMI 29.07 29.79 30.13  Systolic 108 92 116  Diastolic 62 62 79  Pulse 70 84 76  Vital signs reviewed.  Nursing notes reviewed. Weight trend reviewed. Physical Exam General Appearance:  No acute distress appreciable.   Well-groomed, healthy-appearing male.  Well proportioned with no abnormal fat distribution.  Good muscle tone. Pulmonary:  Normal work of breathing at rest, no respiratory distress apparent. SpO2: 98 %  Musculoskeletal: All extremities are intact.  Neurological:  Awake, alert, oriented, and engaged.  No obvious focal neurological deficits or cognitive impairments -memory is similar to usual slightly impaired in nonobvious way.  Sensorium seems unclouded.   Speech is clear and coherent with logical content. Psychiatric:  Appropriate mood, pleasant and cooperative demeanor, thoughtful and engaged during the exam  Office Visit on 07/26/2023  Component Date Value  Ref Range Status   Specific Gravity, UA 07/26/2023 1.020  1.005 - 1.030 Final   pH, UA 07/26/2023 5.5  5.0 - 7.5 Final   Color, UA 07/26/2023 Yellow  Yellow Final   Appearance Ur 07/26/2023 Clear  Clear Final   Leukocytes,UA 07/26/2023 Negative  Negative Final   Protein,UA 07/26/2023 Negative  Negative/Trace Final   Glucose, UA 07/26/2023 Negative  Negative Final   Ketones, UA 07/26/2023 Trace (A)  Negative Final   RBC, UA 07/26/2023 Negative  Negative Final   Bilirubin, UA 07/26/2023 Negative  Negative Final   Urobilinogen, Ur 07/26/2023 1.0  0.2 - 1.0 mg/dL Final   Nitrite, UA 93/81/7974 Negative   Negative Final   Microscopic Examination 07/26/2023 See below:   Final   WBC, UA 07/26/2023 0-5  0 - 5 /hpf Final   RBC, Urine 07/26/2023 0-2  0 - 2 /hpf Final   Epithelial Cells (non renal) 07/26/2023 0-10  0 - 10 /hpf Final   Casts 07/26/2023 Present  None seen /lpf Final   Cast Type 07/26/2023 Hyaline casts  N/A Final   Mucus, UA 07/26/2023 Present (A)  Not Estab. Final   Bacteria, UA 07/26/2023 Few  None seen/Few Final  Office Visit on 07/05/2023  Component Date Value Ref Range Status   Glucose, Bld 07/05/2023 115 (H)  65 - 99 mg/dL Final   BUN 94/71/7974 25  7 - 25 mg/dL Final   Creat 94/71/7974 1.58 (H)  0.70 - 1.35 mg/dL Final   BUN/Creatinine Ratio 07/05/2023 16  6 - 22 (calc) Final   Sodium 07/05/2023 139  135 - 146 mmol/L Final   Potassium 07/05/2023 4.1  3.5 - 5.3 mmol/L Final   Chloride 07/05/2023 104  98 - 110 mmol/L Final   CO2 07/05/2023 24  20 - 32 mmol/L Final   Calcium  07/05/2023 10.0  8.6 - 10.3 mg/dL Final   Microalb, Ur 94/71/7974 <0.7  mg/dL Final   Creatinine,U 94/71/7974 113.2  mg/dL Final   Microalb Creat Ratio 07/05/2023 Unable to calculate  0.0 - 30.0 mg/g Final   CYSTATIN C 07/05/2023 2.08 (H)  0.52 - 1.20 mg/L Final   eGFR 07/05/2023 29 (L)  >=60 mL/min/1.95m2 Final  Office Visit on 06/19/2023  Component Date Value Ref Range Status   Creatinine, Urine 06/19/2023 106  20 - 320 mg/dL Final   Protein/Creat Ratio 06/19/2023 104  25 - 148 mg/g creat Final   Protein/Creatinine Ratio 06/19/2023 0.104  0.025 - 0.148 mg/mg creat Final   Total Protein, Urine 06/19/2023 11  5 - 25 mg/dL Final   Color, Urine 94/87/7974 YELLOW  YELLOW Final   APPearance 06/19/2023 CLEAR  CLEAR Final   Specific Gravity, Urine 06/19/2023 1.026  1.001 - 1.035 Final   pH 06/19/2023 5.5  5.0 - 8.0 Final   Glucose, UA 06/19/2023 3+ (A)  NEGATIVE Final   Bilirubin Urine 06/19/2023 NEGATIVE  NEGATIVE Final   Ketones, ur 06/19/2023 NEGATIVE  NEGATIVE Final   Hgb urine dipstick 06/19/2023  NEGATIVE  NEGATIVE Final   Protein, ur 06/19/2023 NEGATIVE  NEGATIVE Final   Nitrites, Initial 06/19/2023 NEGATIVE  NEGATIVE Final   Leukocyte Esterase 06/19/2023 NEGATIVE  NEGATIVE Final   WBC, UA 06/19/2023 NONE SEEN  0 - 5 /HPF Final   RBC / HPF 06/19/2023 NONE SEEN  0 - 2 /HPF Final   Squamous Epithelial / HPF 06/19/2023 NONE SEEN  < OR = 5 /HPF Final   Bacteria, UA 06/19/2023 NONE SEEN  NONE SEEN /HPF  Final   Hyaline Cast 06/19/2023 NONE SEEN  NONE SEEN /LPF Final   Note 06/19/2023    Final   Sodium 06/19/2023 135  135 - 145 mEq/L Final   Potassium 06/19/2023 4.2  3.5 - 5.1 mEq/L Final   Chloride 06/19/2023 103  96 - 112 mEq/L Final   CO2 06/19/2023 25  19 - 32 mEq/L Final   Glucose, Bld 06/19/2023 115 (H)  70 - 99 mg/dL Final   BUN 94/87/7974 23  6 - 23 mg/dL Final   Creatinine, Ser 06/19/2023 1.58 (H)  0.40 - 1.50 mg/dL Final   GFR 94/87/7974 46.24 (L)  >60.00 mL/min Final   Calcium  06/19/2023 9.3  8.4 - 10.5 mg/dL Final   Microalb, Ur 94/87/7974 <0.7  mg/dL Final   Creatinine,U 94/87/7974 105.6  mg/dL Final   Microalb Creat Ratio 06/19/2023 Unable to calculate  0.0 - 30.0 mg/g Final   Reflexve Urine Culture 06/19/2023    Final  Office Visit on 05/19/2023  Component Date Value Ref Range Status   WBC 05/19/2023 7.5  4.0 - 10.5 K/uL Final   RBC 05/19/2023 4.69  4.22 - 5.81 Mil/uL Final   Hemoglobin 05/19/2023 15.9  13.0 - 17.0 g/dL Final   HCT 95/88/7974 45.9  39.0 - 52.0 % Final   MCV 05/19/2023 97.8  78.0 - 100.0 fl Final   MCHC 05/19/2023 34.7  30.0 - 36.0 g/dL Final   RDW 95/88/7974 11.8  11.5 - 15.5 % Final   Platelets 05/19/2023 221.0  150.0 - 400.0 K/uL Final   Neutrophils Relative % 05/19/2023 70.5  43.0 - 77.0 % Final   Lymphocytes Relative 05/19/2023 18.9  12.0 - 46.0 % Final   Monocytes Relative 05/19/2023 6.4  3.0 - 12.0 % Final   Eosinophils Relative 05/19/2023 3.4  0.0 - 5.0 % Final   Basophils Relative 05/19/2023 0.8  0.0 - 3.0 % Final   Neutro Abs 05/19/2023  5.3  1.4 - 7.7 K/uL Final   Lymphs Abs 05/19/2023 1.4  0.7 - 4.0 K/uL Final   Monocytes Absolute 05/19/2023 0.5  0.1 - 1.0 K/uL Final   Eosinophils Absolute 05/19/2023 0.3  0.0 - 0.7 K/uL Final   Basophils Absolute 05/19/2023 0.1  0.0 - 0.1 K/uL Final   Sodium 05/19/2023 137  135 - 145 mEq/L Final   Potassium 05/19/2023 4.7  3.5 - 5.1 mEq/L Final   Chloride 05/19/2023 101  96 - 112 mEq/L Final   CO2 05/19/2023 28  19 - 32 mEq/L Final   Glucose, Bld 05/19/2023 176 (H)  70 - 99 mg/dL Final   BUN 95/88/7974 20  6 - 23 mg/dL Final   Creatinine, Ser 05/19/2023 1.37  0.40 - 1.50 mg/dL Final   Total Bilirubin 05/19/2023 0.5  0.2 - 1.2 mg/dL Final   Alkaline Phosphatase 05/19/2023 55  39 - 117 U/L Final   AST 05/19/2023 18  0 - 37 U/L Final   ALT 05/19/2023 25  0 - 53 U/L Final   Total Protein 05/19/2023 6.6  6.0 - 8.3 g/dL Final   Albumin 95/88/7974 4.5  3.5 - 5.2 g/dL Final   GFR 95/88/7974 54.90 (L)  >60.00 mL/min Final   Calcium  05/19/2023 9.6  8.4 - 10.5 mg/dL Final   Cholesterol 95/88/7974 91  0 - 200 mg/dL Final   Triglycerides 95/88/7974 275.0 (H)  0.0 - 149.0 mg/dL Final   HDL 95/88/7974 25.60 (L)  >60.99 mg/dL Final   VLDL 95/88/7974 55.0 (H)  0.0 - 40.0  mg/dL Final   LDL Cholesterol 05/19/2023 11  0 - 99 mg/dL Final   Total CHOL/HDL Ratio 05/19/2023 4   Final   NonHDL 05/19/2023 65.55   Final   Hgb A1c MFr Bld 05/19/2023 6.8 (H)  4.6 - 6.5 % Final   Microalb, Ur 05/19/2023 <0.7  mg/dL Final   Creatinine,U 95/88/7974 146.0  mg/dL Final   Microalb Creat Ratio 05/19/2023 Unable to calculate  0.0 - 30.0 mg/g Final   Vitamin B-12 05/19/2023 >1537 (H)  211 - 911 pg/mL Final   Folate 05/19/2023 14.0  >5.9 ng/mL Final   Intrinsic Factor 05/19/2023 Positive (A)  Negative Final   VITD 05/19/2023 23.09 (L)  30.00 - 100.00 ng/mL Final  Office Visit on 12/01/2022  Component Date Value Ref Range Status   Sodium 12/01/2022 136  135 - 145 mEq/L Final   Potassium 12/01/2022 4.1  3.5 - 5.1  mEq/L Final   Chloride 12/01/2022 100  96 - 112 mEq/L Final   CO2 12/01/2022 28  19 - 32 mEq/L Final   Glucose, Bld 12/01/2022 264 (H)  70 - 99 mg/dL Final   BUN 89/75/7975 18  6 - 23 mg/dL Final   Creatinine, Ser 12/01/2022 1.41  0.40 - 1.50 mg/dL Final   Total Bilirubin 12/01/2022 0.6  0.2 - 1.2 mg/dL Final   Alkaline Phosphatase 12/01/2022 53  39 - 117 U/L Final   AST 12/01/2022 18  0 - 37 U/L Final   ALT 12/01/2022 21  0 - 53 U/L Final   Total Protein 12/01/2022 6.7  6.0 - 8.3 g/dL Final   Albumin 89/75/7975 4.2  3.5 - 5.2 g/dL Final   GFR 89/75/7975 53.21 (L)  >60.00 mL/min Final   Calcium  12/01/2022 9.4  8.4 - 10.5 mg/dL Final   Hgb J8r MFr Bld 12/01/2022 6.8 (H)  4.6 - 6.5 % Final   WBC 12/01/2022 6.1  4.0 - 10.5 K/uL Final   RBC 12/01/2022 4.53  4.22 - 5.81 Mil/uL Final   Platelets 12/01/2022 197.0  150.0 - 400.0 K/uL Final   Hemoglobin 12/01/2022 15.3  13.0 - 17.0 g/dL Final   HCT 89/75/7975 45.9  39.0 - 52.0 % Final   MCV 12/01/2022 101.4 (H)  78.0 - 100.0 fl Final   MCHC 12/01/2022 33.4  30.0 - 36.0 g/dL Final   RDW 89/75/7975 12.5  11.5 - 15.5 % Final   Vitamin B-12 12/01/2022 210 (L)  211 - 911 pg/mL Final  Office Visit on 06/01/2022  Component Date Value Ref Range Status   PSA 06/01/2022 0.69  0.10 - 4.00 ng/mL Final  Lab on 05/25/2022  Component Date Value Ref Range Status   TSH 05/25/2022 1.67  0.35 - 5.50 uIU/mL Final   Hgb A1c MFr Bld 05/25/2022 6.5  4.6 - 6.5 % Final   WBC 05/25/2022 8.3  4.0 - 10.5 K/uL Final   RBC 05/25/2022 4.35  4.22 - 5.81 Mil/uL Final   Platelets 05/25/2022 206.0  150.0 - 400.0 K/uL Final   Hemoglobin 05/25/2022 14.9  13.0 - 17.0 g/dL Final   HCT 95/82/7975 42.1  39.0 - 52.0 % Final   MCV 05/25/2022 96.9  78.0 - 100.0 fl Final   MCHC 05/25/2022 35.3  30.0 - 36.0 g/dL Final   RDW 95/82/7975 12.9  11.5 - 15.5 % Final   Sodium 05/25/2022 139  135 - 145 mEq/L Final   Potassium 05/25/2022 4.5  3.5 - 5.1 mEq/L Final   Chloride 05/25/2022  104  96 -  112 mEq/L Final   CO2 05/25/2022 27  19 - 32 mEq/L Final   Glucose, Bld 05/25/2022 130 (H)  70 - 99 mg/dL Final   BUN 95/82/7975 15  6 - 23 mg/dL Final   Creatinine, Ser 05/25/2022 1.25  0.40 - 1.50 mg/dL Final   Total Bilirubin 05/25/2022 0.5  0.2 - 1.2 mg/dL Final   Alkaline Phosphatase 05/25/2022 48  39 - 117 U/L Final   AST 05/25/2022 19  0 - 37 U/L Final   ALT 05/25/2022 26  0 - 53 U/L Final   Total Protein 05/25/2022 6.3  6.0 - 8.3 g/dL Final   Albumin 95/82/7975 4.2  3.5 - 5.2 g/dL Final   GFR 95/82/7975 61.71  >60.00 mL/min Final   Calcium  05/25/2022 9.1  8.4 - 10.5 mg/dL Final   Cholesterol 95/82/7975 97  0 - 200 mg/dL Final   Triglycerides 95/82/7975 151.0 (H)  0.0 - 149.0 mg/dL Final   HDL 95/82/7975 30.80 (L)  >60.99 mg/dL Final   VLDL 95/82/7975 30.2  0.0 - 40.0 mg/dL Final   LDL Cholesterol 05/25/2022 36  0 - 99 mg/dL Final   Total CHOL/HDL Ratio 05/25/2022 3   Final   NonHDL 05/25/2022 66.39   Final  No image results found. MR Abdomen W Wo Contrast Result Date: 07/23/2023 CLINICAL DATA:  Renal mass/cyst on ultrasound EXAM: MRI ABDOMEN WITHOUT AND WITH CONTRAST TECHNIQUE: Multiplanar multisequence MR imaging of the abdomen was performed both before and after the administration of intravenous contrast. CONTRAST:  10 mL Vueway  IV COMPARISON:  Renal ultrasound dated 05/26/2023 FINDINGS: Lower chest: Lung bases are clear. Hepatobiliary: Liver is within normal limits.  No hepatic steatosis. Gallbladder is unremarkable. No intrahepatic or extrahepatic ductal dilatation. Pancreas:  Within normal limits. Spleen:  Within normal limits. Adrenals/Urinary Tract:  Adrenal glands are within normal limits. Simple bilateral renal cysts measuring up to 16 mm in the left lower kidney (series 4/image 33), benign (Bosniak I). Mildly complex/septated right upper pole renal cyst measuring 3.0 cm (series 4/image 25), corresponding to the sonographic abnormality, benign (Bosniak II). No  follow-up is recommended. No hydronephrosis. Stomach/Bowel: Stomach is within normal limits. Visualized bowel is grossly unremarkable. Vascular/Lymphatic:  No evidence of abdominal aortic aneurysm. No suspicious abdominal lymphadenopathy. Other:  No abdominal ascites. Musculoskeletal: No focal osseous lesions. IMPRESSION: Mildly complex/septated right upper pole renal cyst measuring 3.0 cm, corresponding to the sonographic abnormality, benign (Bosniak II). No follow-up is recommended. Additional simple bilateral renal cysts, benign (Bosniak I). Electronically Signed   By: Pinkie Pebbles M.D.   On: 07/23/2023 01:22         ASSESSMENT & PLAN   Assessment & Plan Subacute combined degeneration (HCC) Encouraged patient to continue(s) with aggressive b12 Hypertension, unspecified type His blood pressure has been low, causing dizziness and shaking, particularly when bending over and standing up quickly. Weight loss has improved blood pressure control, necessitating a reduction in antihypertensive medications. The losartan  dose will be reduced to 50 mg daily by cutting the current 100 mg tablets in half. The spironolactone  dose will be reduced to 25 mg daily by cutting the current 50 mg tablets in half. Blood pressure and symptoms will be monitored, with a follow-up in one month. Type 2 diabetes mellitus with stage 3 chronic kidney disease, without long-term current use of insulin, unspecified whether stage 3a or 3b CKD (HCC) Encouraged patient to continue with Ozempic , try to increase dose Lab Results  Component Value Date   HGBA1C 6.8 (H) 05/19/2023  Will recheck next month possibly OSA (obstructive sleep apnea) He intermittently removes his CPAP during sleep, leading to fatigue. There is a potential need for further intervention to improve sleep apnea management. He will be referred to an ear, nose, and throat specialist for evaluation and potential procedure to enhance treatment. Obesity due to  energy imbalance He has lost 35 pounds over seven months using semaglutide  (Ozempic ) 0.5 mg. Further weight loss and muscle maintenance are desired. Increasing the semaglutide  dose to 1 mg is discussed for better weight loss, kidney protection, and sugar control. If tolerated, the dose will be increased to 1 mg, with the option to revert to 0.5 mg if side effects occur. He is encouraged to use the TRX system for resistance training to maintain muscle mass and increase metabolic rate. Continued weight loss is promoted with a focus on reducing waist circumference rather than overall weight. Impingement syndrome of right shoulder region He experiences shoulder pain with limited range of motion, likely due to a partial tear in the rotator cuff or inflamed tendon/bursa. The condition is not severe enough to warrant immediate referral to sports medicine. He is advised to use careful technique when using the TRX system to avoid exacerbating shoulder pain. Shoulder symptoms will be monitored, and a referral to sports medicine will be considered if pain worsens or persists. Mixed hyperlipidemia Encouraged weight loss    ORDER ASSOCIATIONS  #   DIAGNOSIS / CONDITION ICD-10 ENCOUNTER ORDER     ICD-10-CM   1. OSA (obstructive sleep apnea)  G47.33 Ambulatory referral to ENT    2. Subacute combined degeneration (HCC)  E53.8 Cyanocobalamin  (B-12 COMPLIANCE INJECTION) 1000 MCG/ML KIT   G32.0     3. Hypertension, unspecified type  I10 losartan  (COZAAR ) 50 MG tablet    spironolactone  (ALDACTONE ) 25 MG tablet    4. Type 2 diabetes mellitus with stage 3 chronic kidney disease, without long-term current use of insulin, unspecified whether stage 3a or 3b CKD (HCC)  E11.22 Semaglutide , 1 MG/DOSE, 4 MG/3ML SOPN   N18.30       Meds ordered this encounter  Medications   Cyanocobalamin  (B-12 COMPLIANCE INJECTION) 1000 MCG/ML KIT    Sig: Inject 1,000 mg as directed once a week.    Dispense:  12 kit    Refill:  3    NEEDLE, DISP, 25 G (B-D DISP NEEDLE 25GX1) 25G X 1 MISC    Sig: Use to give with B 12    Dispense:  100 each    Refill:  2   SYRINGE-NEEDLE, DISP, 3 ML (LUER LOCK SAFETY SYRINGES) 25G X 1 3 ML MISC    Sig: Use to take B 12    Dispense:  100 each    Refill:  2   losartan  (COZAAR ) 50 MG tablet    Sig: Take 1 tablet (50 mg total) by mouth daily.    Dispense:  90 tablet    Refill:  3   spironolactone  (ALDACTONE ) 25 MG tablet    Sig: Take 1 tablet (25 mg total) by mouth daily. Replaces 50 mg dose    Dispense:  90 tablet    Refill:  3   Semaglutide , 1 MG/DOSE, 4 MG/3ML SOPN    Sig: Inject 1 mg as directed once a week.    Dispense:  3 mL    Refill:  4   Ambulatory referral to ENT       Comments: Pulls off mask for CPAP.  Needs solutions that help mask compliance  lower pressures.  He wears full mask because nose mask.       This document was synthesized by artificial intelligence (Abridge) using HIPAA-compliant recording of the clinical interaction;   We discussed the use of AI scribe software for clinical note transcription with the patient, who gave verbal consent to proceed. additional Info: This encounter employed state-of-the-art, real-time, collaborative documentation. The patient actively reviewed and assisted in updating their electronic medical record on a shared screen, ensuring transparency and facilitating joint problem-solving for the problem list, overview, and plan. This approach promotes accurate, informed care. The treatment plan was discussed and reviewed in detail, including medication safety, potential side effects, and all patient questions. We confirmed understanding and comfort with the plan. Follow-up instructions were established, including contacting the office for any concerns, returning if symptoms worsen, persist, or new symptoms develop, and precautions for potential emergency department visits.

## 2023-09-15 NOTE — Patient Instructions (Addendum)
 It was a pleasure seeing you today! Your health and satisfaction are our top priorities.  Bernardino Cone, MD  VISIT SUMMARY: During your follow-up visit, we discussed your progress with weight management and blood pressure control. You have successfully lost 35 pounds since January with the help of Ozempic , and we reviewed your current medications and symptoms, including dizziness, shoulder pain, and issues with your CPAP use. We also discussed potential adjustments to your treatment plan to better manage your conditions.  YOUR PLAN: -OBSTRUCTIVE SLEEP APNEA: Obstructive sleep apnea is a condition where your breathing stops and starts during sleep. You have been experiencing fatigue due to intermittently removing your CPAP during sleep. We will refer you to an ear, nose, and throat specialist to evaluate and potentially improve your treatment.  -PRIMARY HYPERTENSION AND OTHER SPECIFIED HYPOTENSION: Hypertension is high blood pressure, and hypotension is low blood pressure. Your blood pressure has been low, causing dizziness and shaking. We will reduce your losartan  dose to 50 mg daily and your spironolactone  dose to 25 mg daily. Please monitor your blood pressure and symptoms, and we will follow up in one month.  -OBESITY: Obesity is a condition where you have excess body fat. You have lost 35 pounds with the help of semaglutide  (Ozempic ) 0.5 mg. We discussed increasing your dose to 1 mg for better weight loss, kidney protection, and sugar control. If you tolerate it well, we will continue with the higher dose. You are encouraged to use the TRX system for resistance training to maintain muscle mass and increase your metabolic rate.  -SHOULDER PAIN, POSSIBLE ROTATOR CUFF INJURY: Shoulder pain can be due to a partial tear in the rotator cuff or inflamed tendon/bursa. You have been experiencing pain and limited range of motion. Please use careful technique when using the TRX system to avoid worsening the  pain. We will monitor your symptoms and consider a referral to sports medicine if the pain persists or worsens.  INSTRUCTIONS: Please follow up in one month to monitor your blood pressure and symptoms. Continue using your CPAP and try to keep it on throughout the night. Start using the TRX system for resistance training, and be cautious with your shoulder. If you experience any worsening symptoms, please contact us  immediately.  Your Providers PCP: Cone Bernardino MATSU, MD,  [phone]) Referring Provider: Cone Bernardino MATSU, MD,  [phone]) Care Team Provider: [name], MD,  [phone])  NEXT STEPS: [x]  Early Intervention: Schedule sooner appointment, call our on-call services, or go to emergency room if there is any significant Increase in pain or discomfort New or worsening symptoms Sudden or severe changes in your health [x]  Flexible Follow-Up: We recommend a Return in about 1 month (around 10/16/2023) for chronic disease monitoring and management. for optimal routine care. This allows for progress monitoring and treatment adjustments. [x]  Preventive Care: Schedule your annual preventive care visit! It's typically covered by insurance and helps identify potential health issues early. [x]  Lab & X-ray Appointments: Incomplete tests scheduled today, or call to schedule. X-rays: [facility] (M-F, 8:30am-noon or 1pm-5pm). [x]  Medical Information Release: Sign a release form at front desk to obtain relevant medical information we don't have.  MAKING THE MOST OF OUR FOCUSED 20 MINUTE APPOINTMENTS: [x]   Clearly state your top concerns at the beginning of the visit to focus our discussion [x]   If you anticipate you will need more time, please inform the front desk during scheduling - we can book multiple appointments in the same week. [x]   If you have transportation problems- use our convenient video appointments or ask about transportation support. [x]   We can get down to  business faster if you use MyChart to update information before the visit and submit non-urgent questions before your visit. Thank you for taking the time to provide details through MyChart.  Let our nurse know and she can import this information into your encounter documents.  Arrival and Wait Times: [x]   Arriving on time ensures that everyone receives prompt attention. [x]   Early morning (8a) and afternoon (1p) appointments tend to have shortest wait times. [x]   Unfortunately, we cannot delay appointments for late arrivals or hold slots during phone calls.  Getting Answers and Following Up [x]   Simple Questions & Concerns: For quick questions or basic follow-up after your visit, reach us  at [phone] or MyChart messaging. [x]   Complex Concerns: If your concern is more complex, scheduling an appointment might be best. Discuss this with the staff to find the most suitable option. [x]   Lab & Imaging Results: We'll contact you directly if results are abnormal or you don't use MyChart. Most normal results will be on MyChart within 2-3 business days, with a review message from Dr. Jesus. Haven't heard back in 2 weeks? Need results sooner? Contact us  at [phone]. [x]   Referrals: Our referral coordinator will manage specialist referrals. The specialist's office should contact you within 2 weeks to schedule an appointment. Call us  if you haven't heard from them after 2 weeks.  Staying Connected [x]   MyChart: Activate your MyChart for the fastest way to access results and message us . See the last page of this paperwork for instructions on how to activate.  Bring to Your Next Appointment [x]   Medications: Please bring all your medication bottles to your next appointment to ensure we have an accurate record of your prescriptions. [x]   Health Diaries: If you're monitoring any health conditions at home, keeping a diary of your readings can be very helpful for discussions at your next  appointment.  Billing [x]   X-ray & Lab Orders: These are billed by separate companies. Contact the invoicing company directly for questions or concerns. [x]   Visit Charges: Discuss any billing inquiries with our administrative services team.  Your Satisfaction Matters [x]   Share Your Experience: We strive for your satisfaction! If you have any complaints, or preferably compliments, please let Dr. Jesus know directly or contact our Practice Administrators, Manuelita Rubin or [employer], by asking at the front desk.   Reviewing Your Records [x]   Review this early draft of your clinical encounter notes below and the final encounter summary tomorrow on MyChart after its been completed.  All orders placed so far are visible here: OSA (obstructive sleep apnea) -     Ambulatory referral to ENT  Subacute combined degeneration (HCC) -     B-12 Compliance Injection; Inject 1,000 mg as directed once a week.  Dispense: 12 kit; Refill: 3  Hypertension, unspecified type -     Losartan  Potassium; Take 1 tablet (50 mg total) by mouth daily.  Dispense: 90 tablet; Refill: 3 -     Spironolactone ; Take 1 tablet (25 mg total) by mouth daily. Replaces 50 mg dose  Dispense: 90 tablet; Refill: 3  Type 2 diabetes mellitus with stage 3 chronic kidney disease, without long-term current use of insulin, unspecified whether stage 3a or 3b CKD (HCC) -     Semaglutide  (1 MG/DOSE); Inject 1 mg as directed once a week.  Dispense: 3 mL;  Refill: 4  Obesity due to energy imbalance  Impingement syndrome of right shoulder region  Mixed hyperlipidemia  Other orders -     BD Disp Needle; Use to give with B 12  Dispense: 100 each; Refill: 2 -     Luer Lock Safety Syringes; Use to take B 12  Dispense: 100 each; Refill: 2

## 2023-09-17 ENCOUNTER — Encounter: Payer: Self-pay | Admitting: Internal Medicine

## 2023-09-17 NOTE — Assessment & Plan Note (Signed)
 He has lost 35 pounds over seven months using semaglutide  (Ozempic ) 0.5 mg. Further weight loss and muscle maintenance are desired. Increasing the semaglutide  dose to 1 mg is discussed for better weight loss, kidney protection, and sugar control. If tolerated, the dose will be increased to 1 mg, with the option to revert to 0.5 mg if side effects occur. He is encouraged to use the TRX system for resistance training to maintain muscle mass and increase metabolic rate. Continued weight loss is promoted with a focus on reducing waist circumference rather than overall weight.

## 2023-09-17 NOTE — Assessment & Plan Note (Signed)
 Encouraged patient to continue(s) with aggressive b12

## 2023-09-17 NOTE — Assessment & Plan Note (Signed)
 Encouraged weight loss

## 2023-09-17 NOTE — Assessment & Plan Note (Signed)
 He intermittently removes his CPAP during sleep, leading to fatigue. There is a potential need for further intervention to improve sleep apnea management. He will be referred to an ear, nose, and throat specialist for evaluation and potential procedure to enhance treatment.

## 2023-09-17 NOTE — Assessment & Plan Note (Signed)
 Encouraged patient to continue with Ozempic , try to increase dose Lab Results  Component Value Date   HGBA1C 6.8 (H) 05/19/2023  Will recheck next month possibly

## 2023-09-19 DIAGNOSIS — Z419 Encounter for procedure for purposes other than remedying health state, unspecified: Secondary | ICD-10-CM | POA: Diagnosis not present

## 2023-10-07 DIAGNOSIS — G4733 Obstructive sleep apnea (adult) (pediatric): Secondary | ICD-10-CM | POA: Diagnosis not present

## 2023-10-09 DIAGNOSIS — G4733 Obstructive sleep apnea (adult) (pediatric): Secondary | ICD-10-CM | POA: Diagnosis not present

## 2023-10-17 ENCOUNTER — Encounter: Payer: Self-pay | Admitting: Internal Medicine

## 2023-10-17 ENCOUNTER — Ambulatory Visit (INDEPENDENT_AMBULATORY_CARE_PROVIDER_SITE_OTHER): Admitting: Internal Medicine

## 2023-10-17 VITALS — BP 126/70 | HR 70 | Temp 97.8°F | Ht 70.0 in | Wt 202.8 lb

## 2023-10-17 DIAGNOSIS — E1122 Type 2 diabetes mellitus with diabetic chronic kidney disease: Secondary | ICD-10-CM | POA: Diagnosis not present

## 2023-10-17 DIAGNOSIS — G4733 Obstructive sleep apnea (adult) (pediatric): Secondary | ICD-10-CM

## 2023-10-17 DIAGNOSIS — D485 Neoplasm of uncertain behavior of skin: Secondary | ICD-10-CM | POA: Diagnosis not present

## 2023-10-17 DIAGNOSIS — N1831 Chronic kidney disease, stage 3a: Secondary | ICD-10-CM | POA: Diagnosis not present

## 2023-10-17 DIAGNOSIS — E1159 Type 2 diabetes mellitus with other circulatory complications: Secondary | ICD-10-CM | POA: Diagnosis not present

## 2023-10-17 DIAGNOSIS — G320 Subacute combined degeneration of spinal cord in diseases classified elsewhere: Secondary | ICD-10-CM | POA: Diagnosis not present

## 2023-10-17 DIAGNOSIS — E669 Obesity, unspecified: Secondary | ICD-10-CM

## 2023-10-17 DIAGNOSIS — E538 Deficiency of other specified B group vitamins: Secondary | ICD-10-CM

## 2023-10-17 DIAGNOSIS — I6782 Cerebral ischemia: Secondary | ICD-10-CM | POA: Diagnosis not present

## 2023-10-17 DIAGNOSIS — Z6829 Body mass index (BMI) 29.0-29.9, adult: Secondary | ICD-10-CM | POA: Diagnosis not present

## 2023-10-17 DIAGNOSIS — E782 Mixed hyperlipidemia: Secondary | ICD-10-CM | POA: Diagnosis not present

## 2023-10-17 DIAGNOSIS — I152 Hypertension secondary to endocrine disorders: Secondary | ICD-10-CM

## 2023-10-17 LAB — MAGNESIUM: Magnesium: 2 mg/dL (ref 1.5–2.5)

## 2023-10-17 LAB — COMPREHENSIVE METABOLIC PANEL WITH GFR
ALT: 23 U/L (ref 0–53)
AST: 21 U/L (ref 0–37)
Albumin: 4.3 g/dL (ref 3.5–5.2)
Alkaline Phosphatase: 59 U/L (ref 39–117)
BUN: 16 mg/dL (ref 6–23)
CO2: 27 meq/L (ref 19–32)
Calcium: 9.7 mg/dL (ref 8.4–10.5)
Chloride: 103 meq/L (ref 96–112)
Creatinine, Ser: 1.45 mg/dL (ref 0.40–1.50)
GFR: 51.14 mL/min — ABNORMAL LOW (ref >60.00)
Glucose, Bld: 83 mg/dL (ref 70–99)
Potassium: 4.7 meq/L (ref 3.5–5.1)
Sodium: 136 meq/L (ref 135–145)
Total Bilirubin: 0.7 mg/dL (ref 0.2–1.2)
Total Protein: 7.2 g/dL (ref 6.0–8.3)

## 2023-10-17 LAB — CBC WITH DIFFERENTIAL/PLATELET
Basophils Absolute: 0.1 K/uL (ref 0.0–0.1)
Basophils Relative: 0.9 % (ref 0.0–3.0)
Eosinophils Absolute: 0.2 K/uL (ref 0.0–0.7)
Eosinophils Relative: 3.1 % (ref 0.0–5.0)
HCT: 44 % (ref 39.0–52.0)
Hemoglobin: 15.1 g/dL (ref 13.0–17.0)
Lymphocytes Relative: 20.8 % (ref 12.0–46.0)
Lymphs Abs: 1.3 K/uL (ref 0.7–4.0)
MCHC: 34.2 g/dL (ref 30.0–36.0)
MCV: 93.3 fl (ref 78.0–100.0)
Monocytes Absolute: 0.5 K/uL (ref 0.1–1.0)
Monocytes Relative: 8.1 % (ref 3.0–12.0)
Neutro Abs: 4.3 K/uL (ref 1.4–7.7)
Neutrophils Relative %: 67.1 % (ref 43.0–77.0)
Platelets: 197 K/uL (ref 150.0–400.0)
RBC: 4.72 Mil/uL (ref 4.22–5.81)
RDW: 12.3 % (ref 11.5–15.5)
WBC: 6.4 K/uL (ref 4.0–10.5)

## 2023-10-17 LAB — B12 AND FOLATE PANEL
Folate: 11 ng/mL (ref >5.9)
Vitamin B-12: >1500 pg/mL — ABNORMAL HIGH (ref 211–911)

## 2023-10-17 LAB — HEMOGLOBIN A1C: Hgb A1c MFr Bld: 5.5 % (ref 4.6–6.5)

## 2023-10-17 LAB — MICROALBUMIN / CREATININE URINE RATIO
Creatinine,U: 120 mg/dL
Microalb Creat Ratio: UNDETERMINED mg/g (ref 0.0–30.0)
Microalb, Ur: <0.7 mg/dL

## 2023-10-17 LAB — URIC ACID: Uric Acid, Serum: 6.3 mg/dL (ref 4.0–7.8)

## 2023-10-17 LAB — VITAMIN D 25 HYDROXY (VIT D DEFICIENCY, FRACTURES): VITD: 53.99 ng/mL (ref 30.00–100.00)

## 2023-10-17 LAB — PHOSPHORUS: Phosphorus: 3.4 mg/dL (ref 2.3–4.6)

## 2023-10-17 MED ORDER — EZETIMIBE 10 MG PO TABS: 10.0000 mg | ORAL_TABLET | Freq: Every day | ORAL | 3 refills | Status: AC

## 2023-10-17 MED ORDER — SEMAGLUTIDE (1 MG/DOSE) 4 MG/3ML ~~LOC~~ SOPN: 1.0000 mg | PEN_INJECTOR | SUBCUTANEOUS | 4 refills | Status: AC

## 2023-10-17 NOTE — Progress Notes (Signed)
 ==============================  [facility]: [id number]   -- Medical Office Visit --  Patient: Ralph Hurst      Age: 63 y.o.       Sex:  male  Date:   10/17/2023 Today's Healthcare Provider: Bernardino KANDICE Cone, MD  ==============================   Chief Complaint: Hypertension (Pt is also here for blood work this morning pt is fasting this morning for blood work.) and Diabetes  Discussed the use of AI scribe software for clinical note transcription with the patient, who gave verbal consent to proceed.  History of Present Illness 62 year old male who presents for follow-up on weight management and blood pressure control.  He has been using Ozempic  for weight management without side effects, resulting in a weight reduction from 233 pounds in 2015 to 202 pounds currently, with a 20-pound loss over the past year.  His blood pressure has remained stable, allowing for a reduction in his medication regimen. He currently takes losartan  50 mg and spironolactone  25 mg. He regularly monitors his blood pressure.  He is on rosuvastatin  20 mg for cholesterol management, with a significant decrease in total cholesterol and LDL levels at 11.  He continues to receive weekly B12 injections, which he finds beneficial for his energy and cognitive function. He has a history of B12 deficiency that has affected his cognitive abilities.  He has a history of sleep apnea and uses a CPAP machine. No current dizziness.  He has a history of kidney stones and is increasing water intake to prevent recurrence. He has a history of anemia.  He has some skin lesions that are concerning for being precancerous and is seeking dermatological evaluation.  Wt Readings from Last 50 Encounters:  10/17/23 202 lb 12.8 oz (92 kg)  09/15/23 202 lb 9.6 oz (91.9 kg)  08/14/23 207 lb 9.6 oz (94.2 kg)  07/26/23 210 lb (95.3 kg)  07/25/23 211 lb 6.4 oz (95.9 kg)  07/05/23 215 lb (97.5  kg)  06/19/23 219 lb 6.4 oz (99.5 kg)  05/19/23 224 lb 9.6 oz (101.9 kg)  05/03/23 226 lb (102.5 kg)  03/08/23 233 lb 3.2 oz (105.8 kg)  02/14/23 237 lb (107.5 kg)  01/19/23 233 lb 3.2 oz (105.8 kg)  01/02/23 232 lb (105.2 kg)  12/01/22 228 lb 6 oz (103.6 kg)  06/01/22 226 lb 6.4 oz (102.7 kg)  05/11/22 224 lb (101.6 kg)  10/11/17 234 lb (106.1 kg)  10/17/16 232 lb (105.2 kg)  08/01/16 229 lb 9.6 oz (104.1 kg)  06/22/16 233 lb (105.7 kg)  06/15/16 229 lb (103.9 kg)  01/12/15 224 lb (101.6 kg)  03/20/14 227 lb (103 kg)  01/08/14 241 lb 4.8 oz (109.5 kg)  05/30/13 242 lb (109.8 kg)  12/17/12 238 lb (108 kg)  10/12/11 234 lb (106.1 kg)  07/29/10 235 lb (106.6 kg)   BMI Readings from Last 50 Encounters:  10/17/23 29.10 kg/m  09/15/23 29.07 kg/m  08/14/23 29.79 kg/m  07/26/23 30.13 kg/m  07/25/23 30.33 kg/m  07/05/23 30.85 kg/m  06/19/23 31.48 kg/m  05/19/23 32.23 kg/m  05/03/23 32.43 kg/m  03/08/23 33.46 kg/m  02/14/23 34.01 kg/m  01/19/23 33.46 kg/m  01/02/23 33.29 kg/m  12/01/22 33.24 kg/m  06/01/22 32.95 kg/m  05/11/22 32.60 kg/m  10/11/17 34.06 kg/m  10/17/16 33.53 kg/m  08/01/16 33.18 kg/m  06/22/16 33.67 kg/m  06/15/16 33.09 kg/m  01/12/15 32.14 kg/m  03/20/14 32.57 kg/m  01/08/14 34.62 kg/m  05/30/13 34.97 kg/m  12/17/12 34.39 kg/m  10/12/11 34.06 kg/m  07/29/10 33.72 kg/m   BP Readings from Last 30 Encounters:  10/17/23 126/70  09/15/23 108/62  08/14/23 92/62  07/26/23 116/79  07/25/23 118/64  07/05/23 106/68  06/19/23 93/60  05/19/23 108/60  05/03/23 98/69  03/08/23 116/74  02/14/23 118/70  01/19/23 129/81  01/02/23 120/71  12/01/22 112/65  06/01/22 128/78  05/11/22 120/78  10/11/17 (!) 170/90  10/17/16 (!) 150/108  08/01/16 (!) 180/90  06/22/16 (!) 162/92  06/15/16 (!) 150/90  01/12/15 130/90  03/20/14 110/80  01/08/14 120/84  05/30/13 130/90  12/17/12 120/80  10/12/11 120/88  07/29/10 130/90   Lab  Results  Component Value Date   K 4.7 10/17/2023   K 4.1 07/05/2023   K 4.2 06/19/2023   K 4.7 05/19/2023   K 4.1 12/01/2022   K 4.5 05/25/2022   K 4.3 10/11/2017   K 4.0 10/11/2016   K 4.0 06/10/2016   K 4.4 01/05/2015   K 4.0 03/13/2014   K 3.9 12/27/2013   K 3.7 12/12/2012   K 4.2 10/04/2011   K 4.1 07/01/2010   K 3.7 06/30/2009   K 3.2 (L) 06/27/2008   K 3.1 (L) 05/24/2007   K 3.4 (L) 07/25/2006   K 3.7 05/22/2006   Lab Results  Component Value Date   CHOL 91 05/19/2023   CHOL 97 05/25/2022   CHOL 176 10/11/2017   HDL 25.60 (L) 05/19/2023   HDL 30.80 (L) 05/25/2022   HDL 27.50 (L) 10/11/2017   LDLCALC 11 05/19/2023   LDLCALC 36 05/25/2022   TRIG 275.0 (H) 05/19/2023   TRIG 151.0 (H) 05/25/2022   TRIG (H) 10/11/2017    422.0 Triglyceride is over 400; calculations on Lipids are invalid.   CHOLHDL 4 05/19/2023   CHOLHDL 3 05/25/2022   CHOLHDL 6 10/11/2017   Lab Results  Component Value Date   HGB 15.1 10/17/2023   HGB 15.9 05/19/2023   HGB 15.3 12/01/2022   HGB 14.9 05/25/2022   HGB 17.4 (H) 10/11/2017   HGB 17.2 (H) 06/10/2016   HGB 17.0 01/05/2015   HGB 17.3 (H) 12/27/2013   HGB 17.0 12/12/2012   HGB 17.2 (H) 10/04/2011   HGB 16.4 07/01/2010   HGB 16.8 06/30/2009   HGB 16.4 06/27/2008   HGB 16.7 02/15/2008   HGB 16.2 05/24/2007   HGB 17.6 (H) 05/22/2006   Lab Results  Component Value Date   MCV 93.3 10/17/2023   MCV 97.8 05/19/2023   MCV 101.4 (H) 12/01/2022   MCV 96.9 05/25/2022   MCV 95.7 10/11/2017   MCV 94.2 06/10/2016   MCV 96.8 01/05/2015   MCV 96.2 12/27/2013   MCV 95.2 12/12/2012   MCV 97.2 10/04/2011   MCV 96.4 07/01/2010   MCV 95.3 06/30/2009   MCV 94.9 06/27/2008   MCV 94.5 02/15/2008   MCV 96.5 05/24/2007   MCV 94.1 05/22/2006    Background Reviewed: Problem List: has Hyperlipidemia; Tobacco use disorder; Arthralgia; Type 2 diabetes with stage 3 chronic kidney disease GFR 30-59 (HCC); Hypertension associated with diabetes  (HCC); OSA (obstructive sleep apnea); Memory loss; Subacute combined degeneration (HCC); Obesity due to energy imbalance; Allergic rhinitis; Cerebral ischemia; Persistent cognitive impairment, vascular and nutritional, disabling; Pernicious anemia; Complex renal cyst; Kidney stone; Hepatic steatosis; Vitamin D  deficiency; Long-term current use of injectable noninsulin antidiabetic medication; Macrocytosis associated with alcohol; Chronic kidney disease, stage 3a (HCC); Balance disorder; Cognitive impairment; B12 deficiency; and Neoplasm of uncertain behavior of skin on their  problem list. Past Medical History:  has a past medical history of Diabetes mellitus without complication (HCC), Dizziness (06/19/2023), Essential hypertension (09/14/2006), Hyperlipidemia, Hypertension, Macrocytic anemia (12/01/2022), Need for immunization against influenza (10/11/2017), Polycythemia, Polycythemia, secondary (09/14/2006), Routine general medical examination at a health care facility (10/11/2017), Sleep stage dysfunction (10/17/2016), and Tick bite of abdomen (01/19/2023). Past Surgical History:   has no past surgical history on file. Social History:   reports that he has been smoking cigarettes. He has never used smokeless tobacco. He reports current alcohol use of about 12.0 standard drinks of alcohol per week. He reports that he does not use drugs. Family History:  family history includes Asthma in his father and mother; COPD in his brother and mother; Diabetes in his mother and sister; Hearing loss in his brother and mother; Hyperlipidemia in his father, mother, and sister; Hypertension in his brother, father, mother, and sister; Kidney disease in his mother; Sleep apnea in his father, mother, and son. Allergies:  has no known allergies.   Medication Reconciliation: Current Outpatient Medications on File Prior to Visit  Medication Sig   aspirin  EC 81 MG tablet Take 1 tablet (81 mg total) by mouth daily.   atenolol   (TENORMIN ) 25 MG tablet Take 1 tablet (25 mg total) by mouth daily.   Blood Glucose Monitoring Suppl DEVI 1 each by Does not apply route in the morning, at noon, and at bedtime. May substitute to any manufacturer covered by patient's insurance.   buPROPion  ER (WELLBUTRIN  SR) 100 MG 12 hr tablet Take 1 tablet (100 mg total) by mouth in the morning.   Cholecalciferol (VITAMIN D3) 50 MCG (2000 UT) capsule Take 1 capsule (2,000 Units total) by mouth daily.   Cyanocobalamin  (B-12 COMPLIANCE INJECTION) 1000 MCG/ML KIT Inject 1,000 mg as directed once a week.   Cyanocobalamin  (B-12) 1000 MCG SUBL Place 1 tablet under the tongue daily at 6 (six) AM.   fexofenadine (ALLEGRA ALLERGY) 180 MG tablet Take 180 mg by mouth daily.   Lancets (ONETOUCH ULTRASOFT) lancets Use once daily. Dx E11.9   losartan  (COZAAR ) 50 MG tablet Take 1 tablet (50 mg total) by mouth daily.   NEEDLE, DISP, 25 G (B-D DISP NEEDLE 25GX1) 25G X 1 MISC Use to give with B 12   Omega-3 Fatty Acids (FISH OIL) 1200 MG CAPS Take by mouth.   rosuvastatin  (CRESTOR ) 20 MG tablet Take 1 tablet (20 mg total) by mouth daily.   spironolactone  (ALDACTONE ) 25 MG tablet Take 1 tablet (25 mg total) by mouth daily. Replaces 50 mg dose   SYRINGE-NEEDLE, DISP, 3 ML (LUER LOCK SAFETY SYRINGES) 25G X 1 3 ML MISC Use to take B 12   Current Facility-Administered Medications on File Prior to Visit  Medication   cyanocobalamin  (VITAMIN B12) injection 1,000 mcg   cyanocobalamin  (VITAMIN B12) injection 1,000 mcg   Medications Discontinued During This Encounter  Medication Reason   spironolactone  (ALDACTONE ) 50 MG tablet    Semaglutide , 1 MG/DOSE, 4 MG/3ML SOPN Reorder     Physical Exam:    10/17/2023    7:55 AM 09/15/2023    9:53 AM 08/14/2023   10:29 AM  Vitals with BMI  Height 5' 10 5' 10 5' 10  Weight 202 lbs 13 oz 202 lbs 10 oz 207 lbs 10 oz  BMI 29.1 29.07 29.79  Systolic 126 108 92  Diastolic 70 62 62  Pulse 70 70 84  Vital signs reviewed.   Nursing notes reviewed. Weight trend reviewed. Physical Activity:  Not on file   General Appearance:  No acute distress appreciable.   Well-groomed, healthy-appearing male.  Well proportioned with no abnormal fat distribution.  Good muscle tone. Pulmonary:  Normal work of breathing at rest, no respiratory distress apparent. SpO2: 99 %  Musculoskeletal: All extremities are intact.  Neurological:  Awake, alert, oriented, and engaged.  No obvious focal neurological deficits or cognitive impairments.  Sensorium seems unclouded.   Speech is clear and coherent with logical content. Psychiatric:  Appropriate mood, pleasant and cooperative demeanor, thoughtful and engaged during the exam   Verbalized to patient: Physical Exam MEASUREMENTS: Weight- 202. ABDOMEN: Soft, non-tender, non-distended, without organomegaly, normal bowel sounds.   Results:   Verbalized to patient: Results LABS Cholesterol: Total 176 mg/dL, HDL low, LDL 11 mg/dL, Triglycerides high Hemoglobin: Initially high, returned to normal Red Blood Cells: Macrocytic     02/14/2023   10:49 AM 01/19/2023   11:05 AM 12/12/2022    8:59 AM 12/01/2022    8:28 AM  PHQ 2/9 Scores  PHQ - 2 Score 0 0 0 0  PHQ- 9 Score 2   0    Office Visit on 10/17/2023  Component Date Value Ref Range Status   WBC 10/17/2023 6.4  4.0 - 10.5 K/uL Final   RBC 10/17/2023 4.72  4.22 - 5.81 Mil/uL Final   Hemoglobin 10/17/2023 15.1  13.0 - 17.0 g/dL Final   HCT 90/90/7974 44.0  39.0 - 52.0 % Final   MCV 10/17/2023 93.3  78.0 - 100.0 fl Final   MCHC 10/17/2023 34.2  30.0 - 36.0 g/dL Final   RDW 90/90/7974 12.3  11.5 - 15.5 % Final   Platelets 10/17/2023 197.0  150.0 - 400.0 K/uL Final   Neutrophils Relative % 10/17/2023 67.1  43.0 - 77.0 % Final   Lymphocytes Relative 10/17/2023 20.8  12.0 - 46.0 % Final   Monocytes Relative 10/17/2023 8.1  3.0 - 12.0 % Final   Eosinophils Relative 10/17/2023 3.1  0.0 - 5.0 % Final   Basophils Relative 10/17/2023  0.9  0.0 - 3.0 % Final   Neutro Abs 10/17/2023 4.3  1.4 - 7.7 K/uL Final   Lymphs Abs 10/17/2023 1.3  0.7 - 4.0 K/uL Final   Monocytes Absolute 10/17/2023 0.5  0.1 - 1.0 K/uL Final   Eosinophils Absolute 10/17/2023 0.2  0.0 - 0.7 K/uL Final   Basophils Absolute 10/17/2023 0.1  0.0 - 0.1 K/uL Final   Sodium 10/17/2023 136  135 - 145 mEq/L Final   Potassium 10/17/2023 4.7  3.5 - 5.1 mEq/L Final   Chloride 10/17/2023 103  96 - 112 mEq/L Final   CO2 10/17/2023 27  19 - 32 mEq/L Final   Glucose, Bld 10/17/2023 83  70 - 99 mg/dL Final   BUN 90/90/7974 16  6 - 23 mg/dL Final   Creatinine, Ser 10/17/2023 1.45  0.40 - 1.50 mg/dL Final   Total Bilirubin 10/17/2023 0.7  0.2 - 1.2 mg/dL Final   Alkaline Phosphatase 10/17/2023 59  39 - 117 U/L Final   AST 10/17/2023 21  0 - 37 U/L Final   ALT 10/17/2023 23  0 - 53 U/L Final   Total Protein 10/17/2023 7.2  6.0 - 8.3 g/dL Final   Albumin 90/90/7974 4.3  3.5 - 5.2 g/dL Final   GFR 90/90/7974 51.14 (L)  >60.00 mL/min Final   Calcium  10/17/2023 9.7  8.4 - 10.5 mg/dL Final   Vitamin A-87 90/90/7974 >1500 (H)  211 - 911 pg/mL Final  Folate 10/17/2023 11.0  >5.9 ng/mL Final   Magnesium 10/17/2023 2.0  1.5 - 2.5 mg/dL Final   Phosphorus 90/90/7974 3.4  2.3 - 4.6 mg/dL Final   Uric Acid, Serum 10/17/2023 6.3  4.0 - 7.8 mg/dL Final   VITD 90/90/7974 53.99  30.00 - 100.00 ng/mL Final   Hgb A1c MFr Bld 10/17/2023 5.5  4.6 - 6.5 % Final   Microalb, Ur 10/17/2023 <0.7  mg/dL Final   Creatinine,U 90/90/7974 120.0  mg/dL Final   Microalb Creat Ratio 10/17/2023 Unable to calculate  0.0 - 30.0 mg/g Final  Office Visit on 07/26/2023  Component Date Value Ref Range Status   Specific Gravity, UA 07/26/2023 1.020  1.005 - 1.030 Final   pH, UA 07/26/2023 5.5  5.0 - 7.5 Final   Color, UA 07/26/2023 Yellow  Yellow Final   Appearance Ur 07/26/2023 Clear  Clear Final   Leukocytes,UA 07/26/2023 Negative  Negative Final   Protein,UA 07/26/2023 Negative  Negative/Trace  Final   Glucose, UA 07/26/2023 Negative  Negative Final   Ketones, UA 07/26/2023 Trace (A)  Negative Final   RBC, UA 07/26/2023 Negative  Negative Final   Bilirubin, UA 07/26/2023 Negative  Negative Final   Urobilinogen, Ur 07/26/2023 1.0  0.2 - 1.0 mg/dL Final   Nitrite, UA 93/81/7974 Negative  Negative Final   Microscopic Examination 07/26/2023 See below:   Final   WBC, UA 07/26/2023 0-5  0 - 5 /hpf Final   RBC, Urine 07/26/2023 0-2  0 - 2 /hpf Final   Epithelial Cells (non renal) 07/26/2023 0-10  0 - 10 /hpf Final   Casts 07/26/2023 Present  None seen /lpf Final   Cast Type 07/26/2023 Hyaline casts  N/A Final   Mucus, UA 07/26/2023 Present (A)  Not Estab. Final   Bacteria, UA 07/26/2023 Few  None seen/Few Final  Office Visit on 07/05/2023  Component Date Value Ref Range Status   Glucose, Bld 07/05/2023 115 (H)  65 - 99 mg/dL Final   BUN 94/71/7974 25  7 - 25 mg/dL Final   Creat 94/71/7974 1.58 (H)  0.70 - 1.35 mg/dL Final   BUN/Creatinine Ratio 07/05/2023 16  6 - 22 (calc) Final   Sodium 07/05/2023 139  135 - 146 mmol/L Final   Potassium 07/05/2023 4.1  3.5 - 5.3 mmol/L Final   Chloride 07/05/2023 104  98 - 110 mmol/L Final   CO2 07/05/2023 24  20 - 32 mmol/L Final   Calcium  07/05/2023 10.0  8.6 - 10.3 mg/dL Final   Microalb, Ur 94/71/7974 <0.7  mg/dL Final   Creatinine,U 94/71/7974 113.2  mg/dL Final   Microalb Creat Ratio 07/05/2023 Unable to calculate  0.0 - 30.0 mg/g Final   CYSTATIN C 07/05/2023 2.08 (H)  0.52 - 1.20 mg/L Final   eGFR 07/05/2023 29 (L)  >=60 mL/min/1.24m2 Final  Office Visit on 06/19/2023  Component Date Value Ref Range Status   Creatinine, Urine 06/19/2023 106  20 - 320 mg/dL Final   Protein/Creat Ratio 06/19/2023 104  25 - 148 mg/g creat Final   Protein/Creatinine Ratio 06/19/2023 0.104  0.025 - 0.148 mg/mg creat Final   Total Protein, Urine 06/19/2023 11  5 - 25 mg/dL Final   Color, Urine 94/87/7974 YELLOW  YELLOW Final   APPearance 06/19/2023 CLEAR   CLEAR Final   Specific Gravity, Urine 06/19/2023 1.026  1.001 - 1.035 Final   pH 06/19/2023 5.5  5.0 - 8.0 Final   Glucose, UA 06/19/2023 3+ (A)  NEGATIVE Final  Bilirubin Urine 06/19/2023 NEGATIVE  NEGATIVE Final   Ketones, ur 06/19/2023 NEGATIVE  NEGATIVE Final   Hgb urine dipstick 06/19/2023 NEGATIVE  NEGATIVE Final   Protein, ur 06/19/2023 NEGATIVE  NEGATIVE Final   Nitrites, Initial 06/19/2023 NEGATIVE  NEGATIVE Final   Leukocyte Esterase 06/19/2023 NEGATIVE  NEGATIVE Final   WBC, UA 06/19/2023 NONE SEEN  0 - 5 /HPF Final   RBC / HPF 06/19/2023 NONE SEEN  0 - 2 /HPF Final   Squamous Epithelial / HPF 06/19/2023 NONE SEEN  < OR = 5 /HPF Final   Bacteria, UA 06/19/2023 NONE SEEN  NONE SEEN /HPF Final   Hyaline Cast 06/19/2023 NONE SEEN  NONE SEEN /LPF Final   Note 06/19/2023    Final   Sodium 06/19/2023 135  135 - 145 mEq/L Final   Potassium 06/19/2023 4.2  3.5 - 5.1 mEq/L Final   Chloride 06/19/2023 103  96 - 112 mEq/L Final   CO2 06/19/2023 25  19 - 32 mEq/L Final   Glucose, Bld 06/19/2023 115 (H)  70 - 99 mg/dL Final   BUN 94/87/7974 23  6 - 23 mg/dL Final   Creatinine, Ser 06/19/2023 1.58 (H)  0.40 - 1.50 mg/dL Final   GFR 94/87/7974 46.24 (L)  >60.00 mL/min Final   Calcium  06/19/2023 9.3  8.4 - 10.5 mg/dL Final   Microalb, Ur 94/87/7974 <0.7  mg/dL Final   Creatinine,U 94/87/7974 105.6  mg/dL Final   Microalb Creat Ratio 06/19/2023 Unable to calculate  0.0 - 30.0 mg/g Final   Reflexve Urine Culture 06/19/2023    Final  Office Visit on 05/19/2023  Component Date Value Ref Range Status   WBC 05/19/2023 7.5  4.0 - 10.5 K/uL Final   RBC 05/19/2023 4.69  4.22 - 5.81 Mil/uL Final   Hemoglobin 05/19/2023 15.9  13.0 - 17.0 g/dL Final   HCT 95/88/7974 45.9  39.0 - 52.0 % Final   MCV 05/19/2023 97.8  78.0 - 100.0 fl Final   MCHC 05/19/2023 34.7  30.0 - 36.0 g/dL Final   RDW 95/88/7974 11.8  11.5 - 15.5 % Final   Platelets 05/19/2023 221.0  150.0 - 400.0 K/uL Final   Neutrophils  Relative % 05/19/2023 70.5  43.0 - 77.0 % Final   Lymphocytes Relative 05/19/2023 18.9  12.0 - 46.0 % Final   Monocytes Relative 05/19/2023 6.4  3.0 - 12.0 % Final   Eosinophils Relative 05/19/2023 3.4  0.0 - 5.0 % Final   Basophils Relative 05/19/2023 0.8  0.0 - 3.0 % Final   Neutro Abs 05/19/2023 5.3  1.4 - 7.7 K/uL Final   Lymphs Abs 05/19/2023 1.4  0.7 - 4.0 K/uL Final   Monocytes Absolute 05/19/2023 0.5  0.1 - 1.0 K/uL Final   Eosinophils Absolute 05/19/2023 0.3  0.0 - 0.7 K/uL Final   Basophils Absolute 05/19/2023 0.1  0.0 - 0.1 K/uL Final   Sodium 05/19/2023 137  135 - 145 mEq/L Final   Potassium 05/19/2023 4.7  3.5 - 5.1 mEq/L Final   Chloride 05/19/2023 101  96 - 112 mEq/L Final   CO2 05/19/2023 28  19 - 32 mEq/L Final   Glucose, Bld 05/19/2023 176 (H)  70 - 99 mg/dL Final   BUN 95/88/7974 20  6 - 23 mg/dL Final   Creatinine, Ser 05/19/2023 1.37  0.40 - 1.50 mg/dL Final   Total Bilirubin 05/19/2023 0.5  0.2 - 1.2 mg/dL Final   Alkaline Phosphatase 05/19/2023 55  39 - 117 U/L Final  AST 05/19/2023 18  0 - 37 U/L Final   ALT 05/19/2023 25  0 - 53 U/L Final   Total Protein 05/19/2023 6.6  6.0 - 8.3 g/dL Final   Albumin 95/88/7974 4.5  3.5 - 5.2 g/dL Final   GFR 95/88/7974 54.90 (L)  >60.00 mL/min Final   Calcium  05/19/2023 9.6  8.4 - 10.5 mg/dL Final   Cholesterol 95/88/7974 91  0 - 200 mg/dL Final   Triglycerides 95/88/7974 275.0 (H)  0.0 - 149.0 mg/dL Final   HDL 95/88/7974 25.60 (L)  >60.99 mg/dL Final   VLDL 95/88/7974 55.0 (H)  0.0 - 40.0 mg/dL Final   LDL Cholesterol 05/19/2023 11  0 - 99 mg/dL Final   Total CHOL/HDL Ratio 05/19/2023 4   Final   NonHDL 05/19/2023 65.55   Final   Hgb A1c MFr Bld 05/19/2023 6.8 (H)  4.6 - 6.5 % Final   Microalb, Ur 05/19/2023 <0.7  mg/dL Final   Creatinine,U 95/88/7974 146.0  mg/dL Final   Microalb Creat Ratio 05/19/2023 Unable to calculate  0.0 - 30.0 mg/g Final   Vitamin B-12 05/19/2023 >1537 (H)  211 - 911 pg/mL Final   Folate  05/19/2023 14.0  >5.9 ng/mL Final   Intrinsic Factor 05/19/2023 Positive (A)  Negative Final   VITD 05/19/2023 23.09 (L)  30.00 - 100.00 ng/mL Final  Office Visit on 12/01/2022  Component Date Value Ref Range Status   Sodium 12/01/2022 136  135 - 145 mEq/L Final   Potassium 12/01/2022 4.1  3.5 - 5.1 mEq/L Final   Chloride 12/01/2022 100  96 - 112 mEq/L Final   CO2 12/01/2022 28  19 - 32 mEq/L Final   Glucose, Bld 12/01/2022 264 (H)  70 - 99 mg/dL Final   BUN 89/75/7975 18  6 - 23 mg/dL Final   Creatinine, Ser 12/01/2022 1.41  0.40 - 1.50 mg/dL Final   Total Bilirubin 12/01/2022 0.6  0.2 - 1.2 mg/dL Final   Alkaline Phosphatase 12/01/2022 53  39 - 117 U/L Final   AST 12/01/2022 18  0 - 37 U/L Final   ALT 12/01/2022 21  0 - 53 U/L Final   Total Protein 12/01/2022 6.7  6.0 - 8.3 g/dL Final   Albumin 89/75/7975 4.2  3.5 - 5.2 g/dL Final   GFR 89/75/7975 53.21 (L)  >60.00 mL/min Final   Calcium  12/01/2022 9.4  8.4 - 10.5 mg/dL Final   Hgb J8r MFr Bld 12/01/2022 6.8 (H)  4.6 - 6.5 % Final   WBC 12/01/2022 6.1  4.0 - 10.5 K/uL Final   RBC 12/01/2022 4.53  4.22 - 5.81 Mil/uL Final   Platelets 12/01/2022 197.0  150.0 - 400.0 K/uL Final   Hemoglobin 12/01/2022 15.3  13.0 - 17.0 g/dL Final   HCT 89/75/7975 45.9  39.0 - 52.0 % Final   MCV 12/01/2022 101.4 (H)  78.0 - 100.0 fl Final   MCHC 12/01/2022 33.4  30.0 - 36.0 g/dL Final   RDW 89/75/7975 12.5  11.5 - 15.5 % Final   Vitamin B-12 12/01/2022 210 (L)  211 - 911 pg/mL Final  Office Visit on 06/01/2022  Component Date Value Ref Range Status   PSA 06/01/2022 0.69  0.10 - 4.00 ng/mL Final  Lab on 05/25/2022  Component Date Value Ref Range Status   TSH 05/25/2022 1.67  0.35 - 5.50 uIU/mL Final   Hgb A1c MFr Bld 05/25/2022 6.5  4.6 - 6.5 % Final   WBC 05/25/2022 8.3  4.0 - 10.5 K/uL Final  RBC 05/25/2022 4.35  4.22 - 5.81 Mil/uL Final   Platelets 05/25/2022 206.0  150.0 - 400.0 K/uL Final   Hemoglobin 05/25/2022 14.9  13.0 - 17.0 g/dL Final    HCT 95/82/7975 42.1  39.0 - 52.0 % Final   MCV 05/25/2022 96.9  78.0 - 100.0 fl Final   MCHC 05/25/2022 35.3  30.0 - 36.0 g/dL Final   RDW 95/82/7975 12.9  11.5 - 15.5 % Final   Sodium 05/25/2022 139  135 - 145 mEq/L Final   Potassium 05/25/2022 4.5  3.5 - 5.1 mEq/L Final   Chloride 05/25/2022 104  96 - 112 mEq/L Final   CO2 05/25/2022 27  19 - 32 mEq/L Final   Glucose, Bld 05/25/2022 130 (H)  70 - 99 mg/dL Final   BUN 95/82/7975 15  6 - 23 mg/dL Final   Creatinine, Ser 05/25/2022 1.25  0.40 - 1.50 mg/dL Final   Total Bilirubin 05/25/2022 0.5  0.2 - 1.2 mg/dL Final   Alkaline Phosphatase 05/25/2022 48  39 - 117 U/L Final   AST 05/25/2022 19  0 - 37 U/L Final   ALT 05/25/2022 26  0 - 53 U/L Final   Total Protein 05/25/2022 6.3  6.0 - 8.3 g/dL Final   Albumin 95/82/7975 4.2  3.5 - 5.2 g/dL Final   GFR 95/82/7975 61.71  >60.00 mL/min Final   Calcium  05/25/2022 9.1  8.4 - 10.5 mg/dL Final   Cholesterol 95/82/7975 97  0 - 200 mg/dL Final   Triglycerides 95/82/7975 151.0 (H)  0.0 - 149.0 mg/dL Final   HDL 95/82/7975 30.80 (L)  >60.99 mg/dL Final   VLDL 95/82/7975 30.2  0.0 - 40.0 mg/dL Final   LDL Cholesterol 05/25/2022 36  0 - 99 mg/dL Final   Total CHOL/HDL Ratio 05/25/2022 3   Final   NonHDL 05/25/2022 66.39   Final  No image results found. No results found.       ASSESSMENT & PLAN   Assessment & Plan Type 2 diabetes mellitus with stage 3 chronic kidney disease, without long-term current use of insulin, unspecified whether stage 3a or 3b CKD (HCC)  Type 2 diabetes mellitus with stage 3 chronic kidney disease   Current management appears effective. Order full panel blood work to monitor kidney function and A1c levels.  Neoplasm of uncertain behavior of skin  Neoplasm of uncertain behavior of skin (forearm, possible precancerous lesion)   Forearm lesions suspected to be precancerous. Lesions are likely sun-related and may be treated with freezing or excision. Refer to  dermatology for evaluation and possible removal of forearm lesions.   Chronic kidney disease, stage 3a (HCC) Will order lab testing to guide management.  Cerebral ischemia Cerebral ischemia   Consideration of Zetia  for potential improvement in cerebral blood flow. No interactions with current medications. Discuss and consider starting Zetia  for cerebral ischemia. Obesity due to energy imbalance Obesity due to energy imbalance   Weight has decreased from 233 lbs to 202 lbs since last year, indicating significant weight loss. Current BMI still classifies as overweight, but cardiovascular impact is minimal. No side effects from Ozempic , which provides kidney, liver, and memory benefits at the current dose. Increasing the dose could lead to vitamin deficiencies and muscle loss. Continue Ozempic  at 1 mg for weight maintenance and other health benefits. Encourage resistance training and muscle building exercises. Focus on body composition rather than weight loss. Provide a year's supply of Ozempic  at the current dose, with prescriptions for three months at a time.  Hypertension associated with diabetes (HCC)  Hypertension   Blood pressure is well-controlled with current medication regimen. Spironolactone  dose reduced to 25 mg. Potassium levels previously high but currently stable. Continue current antihypertensive regimen with losartan  50 mg and spironolactone  25 mg. Monitor potassium levels as needed.  Mixed hyperlipidemia  Mixed hyperlipidemia   Cholesterol levels are well-controlled with rosuvastatin . LDL is exceptionally low. Triglycerides are slightly elevated, likely due to high carbohydrate intake. Consideration of Zetia  for potential brain benefits. Continue rosuvastatin  20 mg daily. Encourage dietary changes to reduce carbohydrate intake and increase protein, particularly from fish. Consider adding Zetia  for potential brain benefits.   OSA (obstructive sleep apnea) Obstructive sleep apnea    Use of CPAP machine has improved symptoms and may contribute to cognitive improvements.   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 Red blood cell size remains slightly large, likely due to long-term B12 deficiency. Continued B12 supplementation is crucial for cognitive and energy improvements. Continue B12 injections weekly.   Follow-Up   Regular follow-up is necessary to monitor progress and adjust treatment plans as needed. Schedule follow-up appointments as needed to monitor progress and adjust treatment plans. Conduct blood work today to monitor kidney function, A1c, cholesterol, and other relevant parameters.  ORDER ASSOCIATIONS  #   DIAGNOSIS / CONDITION ICD-10 ENCOUNTER ORDER     ICD-10-CM   1. Type 2 diabetes mellitus with stage 3 chronic kidney disease, without long-term current use of insulin, unspecified whether stage 3a or 3b CKD (HCC)  E11.22 Semaglutide , 1 MG/DOSE, 4 MG/3ML SOPN   N18.30 CBC with Differential/Platelet    Comp Met (CMET)    HgB A1c    2. Neoplasm of uncertain behavior of skin  D48.5 Microalbumin / creatinine urine ratio    Ambulatory referral to Dermatology    3. Chronic kidney disease, stage 3a (HCC)  N18.31 CBC with Differential/Platelet    Comp Met (CMET)    B12 and Folate Panel    Magnesium    Phosphorus    Uric acid    PTH, intact (no Ca)    Vitamin D  (25 hydroxy)    4. Cerebral ischemia  I67.82 ezetimibe  (ZETIA ) 10 MG tablet    5. Obesity due to energy imbalance  E66.9     6. Hypertension associated with diabetes (HCC)  E11.59    I15.2     7. Mixed hyperlipidemia  E78.2     8. OSA (obstructive sleep apnea)  G47.33     9. Subacute combined degeneration (HCC)  E53.8    G32.0            Orders Placed in Encounter:   Lab Orders         CBC with Differential/Platelet         Comp Met (CMET)         B12 and Folate  Panel         Magnesium         Phosphorus         Uric acid         PTH, intact (no Ca)         Vitamin D  (25 hydroxy)         HgB A1c         Microalbumin / creatinine urine ratio     Imaging Orders  No imaging studies ordered today   Referral Orders  Ambulatory referral to Dermatology     Meds ordered this encounter  Medications   Semaglutide , 1 MG/DOSE, 4 MG/3ML SOPN    Sig: Inject 1 mg as directed once a week.    Dispense:  9 mL    Refill:  4   ezetimibe  (ZETIA ) 10 MG tablet    Sig: Take 1 tablet (10 mg total) by mouth daily.    Dispense:  90 tablet    Refill:  3    Orders Placed This Encounter  Procedures   CBC with Differential/Platelet   Comp Met (CMET)   B12 and Folate Panel   Magnesium   Phosphorus   Uric acid   PTH, intact (no Ca)   Vitamin D  (25 hydroxy)   HgB A1c   Microalbumin / creatinine urine ratio    [place]   Ambulatory referral to Dermatology    Referral Priority:   Routine    Referral Type:   Consultation    Referral Reason:   Specialty Services Required    Requested Specialty:   Dermatology    Number of Visits Requested:   1   ED Discharge Orders          Ordered    Semaglutide , 1 MG/DOSE, 4 MG/3ML SOPN  Weekly        10/17/23 0824    CBC with Differential/Platelet        10/17/23 0824    Comp Met (CMET)        10/17/23 0824    B12 and Folate Panel        10/17/23 0824    Magnesium        10/17/23 0824    Phosphorus        10/17/23 0824    Uric acid        10/17/23 0824    PTH, intact (no Ca)        10/17/23 0824    Vitamin D  (25 hydroxy)        10/17/23 0824    ezetimibe  (ZETIA ) 10 MG tablet  Daily        10/17/23 0824    HgB A1c        10/17/23 0824    Microalbumin / creatinine urine ratio       Comments: Specimen [id number]: [place]    10/17/23 0824    Ambulatory referral to Dermatology        10/17/23 0824              This document was synthesized by artificial intelligence ([employer]) using  HIPAA-compliant recording of the clinical interaction;   We discussed the use of AI scribe software for clinical note transcription with the patient, who gave verbal consent to proceed. additional Info: This encounter employed state-of-the-art, real-time, collaborative documentation. The patient actively reviewed and assisted in updating their electronic medical record on a shared screen, ensuring transparency and facilitating joint problem-solving for the problem list, overview, and plan. This approach promotes accurate, informed care. The treatment plan was discussed and reviewed in detail, including medication safety, potential side effects, and all patient questions. We confirmed understanding and comfort with the plan. Follow-up instructions were established, including contacting the office for any concerns, returning if symptoms worsen, persist, or new symptoms develop, and precautions for potential emergency department visits.

## 2023-10-17 NOTE — Assessment & Plan Note (Signed)
 Obstructive sleep apnea   Use of CPAP machine has improved symptoms and may contribute to cognitive improvements.

## 2023-10-17 NOTE — Assessment & Plan Note (Signed)
  Mixed hyperlipidemia   Cholesterol levels are well-controlled with rosuvastatin . LDL is exceptionally low. Triglycerides are slightly elevated, likely due to high carbohydrate intake. Consideration of Zetia  for potential brain benefits. Continue rosuvastatin  20 mg daily. Encourage dietary changes to reduce carbohydrate intake and increase protein, particularly from fish. Consider adding Zetia  for potential brain benefits.

## 2023-10-17 NOTE — Assessment & Plan Note (Signed)
  Type 2 diabetes mellitus with stage 3 chronic kidney disease   Current management appears effective. Order full panel blood work to monitor kidney function and A1c levels.

## 2023-10-17 NOTE — Assessment & Plan Note (Signed)
  Neoplasm of uncertain behavior of skin (forearm, possible precancerous lesion)   Forearm lesions suspected to be precancerous. Lesions are likely sun-related and may be treated with freezing or excision. Refer to dermatology for evaluation and possible removal of forearm lesions.

## 2023-10-17 NOTE — Assessment & Plan Note (Signed)
 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 Red blood cell size remains slightly large, likely due to long-term B12 deficiency. Continued B12 supplementation is crucial for cognitive and energy improvements. Continue B12 injections weekly.

## 2023-10-17 NOTE — Assessment & Plan Note (Signed)
 Cerebral ischemia   Consideration of Zetia  for potential improvement in cerebral blood flow. No interactions with current medications. Discuss and consider starting Zetia  for cerebral ischemia.

## 2023-10-17 NOTE — Assessment & Plan Note (Signed)
 Will order lab testing to guide management.

## 2023-10-17 NOTE — Assessment & Plan Note (Signed)
  Hypertension   Blood pressure is well-controlled with current medication regimen. Spironolactone  dose reduced to 25 mg. Potassium levels previously high but currently stable. Continue current antihypertensive regimen with losartan  50 mg and spironolactone  25 mg. Monitor potassium levels as needed.

## 2023-10-17 NOTE — Patient Instructions (Addendum)
 It was a pleasure seeing you today! Your health and satisfaction are our top priorities.  Ralph Cone, MD  VISIT SUMMARY: Today, you came in for a follow-up on your weight management and blood pressure control. We discussed your progress with Ozempic , your stable blood pressure, cholesterol management, B12 injections, sleep apnea, and skin lesions. We also reviewed your history of kidney stones and anemia.  YOUR PLAN: -OBESITY DUE TO ENERGY IMBALANCE: Your weight has decreased from 233 lbs to 202 lbs over the past year, which is a significant achievement. Although your BMI still classifies you as overweight, the cardiovascular impact is minimal. Continue taking Ozempic  at 1 mg for weight maintenance and other health benefits. We encourage you to focus on body composition rather than just weight loss and to engage in resistance training and muscle-building exercises. You will receive a year's supply of Ozempic , with prescriptions for three months at a time.  -TYPE 2 DIABETES MELLITUS WITH STAGE 3 CHRONIC KIDNEY DISEASE: Your current management for diabetes and kidney disease appears effective. We will order a full panel blood work to monitor your kidney function and A1c levels.  -HYPERTENSION: Your blood pressure is well-controlled with your current medication regimen. Continue taking losartan  50 mg and spironolactone  25 mg. We will monitor your potassium levels as needed.  -MIXED HYPERLIPIDEMIA: Your cholesterol levels are well-controlled with rosuvastatin , but your triglycerides are slightly elevated. Continue taking rosuvastatin  20 mg daily. We encourage you to reduce your carbohydrate intake and increase your protein intake, particularly from fish. We are considering adding Zetia  for potential brain benefits.  -OBSTRUCTIVE SLEEP APNEA: Your use of the CPAP machine has improved your symptoms and may be contributing to cognitive improvements. Continue using the CPAP machine as  prescribed.  -SUBACUTE COMBINED DEGENERATION DUE TO B12 DEFICIENCY: Your ongoing B12 injections are improving your symptoms. Continue with the weekly B12 injections as they are crucial for your cognitive and energy improvements.  -NEOPLASM OF UNCERTAIN BEHAVIOR OF SKIN (FOREARM, POSSIBLE PRECANCEROUS LESION): The lesions on your forearm are suspected to be precancerous and are likely sun-related. We will refer you to dermatology for evaluation and possible removal of these lesions.  -CEREBRAL ISCHEMIA: We are considering adding Zetia  to your regimen for potential improvement in cerebral blood flow. There are no interactions with your current medications, and we will discuss this further.  INSTRUCTIONS: Please schedule follow-up appointments as needed to monitor your progress and adjust treatment plans. Conduct blood work today to monitor your kidney function, A1c, cholesterol, and other relevant parameters.  Your Providers PCP: Ralph Ralph MATSU, MD,  [phone]) Referring Provider: [name] MATSU, MD,  [phone]) Care Team Provider: [name], MD,  [phone])  NEXT STEPS: [x]  Early Intervention: Schedule sooner appointment, call our on-call services, or go to emergency room if there is any significant Increase in pain or discomfort New or worsening symptoms Sudden or severe changes in your health [x]  Flexible Follow-Up: We recommend a Return in about 3 months (around 01/16/2024). for optimal routine care. This allows for progress monitoring and treatment adjustments. [x]  Preventive Care: Schedule your annual preventive care visit! It's typically covered by insurance and helps identify potential health issues early. [x]  Lab & X-ray Appointments: Incomplete tests scheduled today, or call to schedule. X-rays: [facility] (M-F, 8:30am-noon or 1pm-5pm). [x]  Medical Information Release: Sign a release form at front desk to obtain relevant medical information we don't  have.  MAKING THE MOST OF OUR FOCUSED 20 MINUTE APPOINTMENTS: [x]   Clearly  state your top concerns at the beginning of the visit to focus our discussion [x]   If you anticipate you will need more time, please inform the front desk during scheduling - we can book multiple appointments in the same week. [x]   If you have transportation problems- use our convenient video appointments or ask about transportation support. [x]   We can get down to business faster if you use MyChart to update information before the visit and submit non-urgent questions before your visit. Thank you for taking the time to provide details through MyChart.  Let our nurse know and she can import this information into your encounter documents.  Arrival and Wait Times: [x]   Arriving on time ensures that everyone receives prompt attention. [x]   Early morning (8a) and afternoon (1p) appointments tend to have shortest wait times. [x]   Unfortunately, we cannot delay appointments for late arrivals or hold slots during phone calls.  Getting Answers and Following Up [x]   Simple Questions & Concerns: For quick questions or basic follow-up after your visit, reach us  at [phone] or MyChart messaging. [x]   Complex Concerns: If your concern is more complex, scheduling an appointment might be best. Discuss this with the staff to find the most suitable option. [x]   Lab & Imaging Results: We'll contact you directly if results are abnormal or you don't use MyChart. Most normal results will be on MyChart within 2-3 business days, with a review message from Dr. Jesus. Haven't heard back in 2 weeks? Need results sooner? Contact us  at [phone]. [x]   Referrals: Our referral coordinator will manage specialist referrals. The specialist's office should contact you within 2 weeks to schedule an appointment. Call us  if you haven't heard from them after 2 weeks.  Staying Connected [x]   MyChart: Activate your MyChart for the fastest way to  access results and message us . See the last page of this paperwork for instructions on how to activate.  Bring to Your Next Appointment [x]   Medications: Please bring all your medication bottles to your next appointment to ensure we have an accurate record of your prescriptions. [x]   Health Diaries: If you're monitoring any health conditions at home, keeping a diary of your readings can be very helpful for discussions at your next appointment.  Billing [x]   X-ray & Lab Orders: These are billed by separate companies. Contact the invoicing company directly for questions or concerns. [x]   Visit Charges: Discuss any billing inquiries with our administrative services team.  Your Satisfaction Matters [x]   Share Your Experience: We strive for your satisfaction! If you have any complaints, or preferably compliments, please let Dr. Jesus know directly or contact our Practice Administrators, Manuelita Rubin or [employer], by asking at the front desk.   Reviewing Your Records [x]   Review this early draft of your clinical encounter notes below and the final encounter summary tomorrow on MyChart after its been completed.  All orders placed so far are visible here: Type 2 diabetes mellitus with stage 3 chronic kidney disease, without long-term current use of insulin, unspecified whether stage 3a or 3b CKD (HCC) -     Semaglutide  (1 MG/DOSE); Inject 1 mg as directed once a week.  Dispense: 9 mL; Refill: 4 -     CBC with Differential/Platelet -     Comprehensive metabolic panel with GFR -     Hemoglobin A1c  Neoplasm of uncertain behavior of skin -     Microalbumin / creatinine urine ratio -     Ambulatory  referral to Dermatology  Chronic kidney disease, stage 3a (HCC) -     CBC with Differential/Platelet -     Comprehensive metabolic panel with GFR -     A87 and Folate Panel -     Magnesium -     Phosphorus -     Uric acid -     Parathyroid  hormone, intact (no Ca) -     VITAMIN D  25 Hydroxy  (Vit-D Deficiency, Fractures)  Cerebral ischemia -     Ezetimibe ; Take 1 tablet (10 mg total) by mouth daily.  Dispense: 90 tablet; Refill: 3  Obesity due to energy imbalance  Hypertension associated with diabetes (HCC)  Mixed hyperlipidemia  OSA (obstructive sleep apnea)  Subacute combined degeneration (HCC)

## 2023-10-17 NOTE — Assessment & Plan Note (Signed)
 Obesity due to energy imbalance   Weight has decreased from 233 lbs to 202 lbs since last year, indicating significant weight loss. Current BMI still classifies as overweight, but cardiovascular impact is minimal. No side effects from Ozempic , which provides kidney, liver, and memory benefits at the current dose. Increasing the dose could lead to vitamin deficiencies and muscle loss. Continue Ozempic  at 1 mg for weight maintenance and other health benefits. Encourage resistance training and muscle building exercises. Focus on body composition rather than weight loss. Provide a year's supply of Ozempic  at the current dose, with prescriptions for three months at a time.

## 2023-10-18 DIAGNOSIS — E119 Type 2 diabetes mellitus without complications: Secondary | ICD-10-CM | POA: Diagnosis not present

## 2023-10-18 DIAGNOSIS — H35033 Hypertensive retinopathy, bilateral: Secondary | ICD-10-CM | POA: Diagnosis not present

## 2023-10-18 DIAGNOSIS — H2513 Age-related nuclear cataract, bilateral: Secondary | ICD-10-CM | POA: Diagnosis not present

## 2023-10-18 LAB — PARATHYROID HORMONE, INTACT (NO CA): PTH: 29 pg/mL (ref 16–77)

## 2023-10-18 LAB — HM DIABETES EYE EXAM

## 2023-10-20 DIAGNOSIS — Z419 Encounter for procedure for purposes other than remedying health state, unspecified: Secondary | ICD-10-CM | POA: Diagnosis not present

## 2023-10-21 ENCOUNTER — Ambulatory Visit: Payer: Self-pay | Admitting: Internal Medicine

## 2023-11-07 DIAGNOSIS — G4733 Obstructive sleep apnea (adult) (pediatric): Secondary | ICD-10-CM | POA: Diagnosis not present

## 2023-11-15 ENCOUNTER — Ambulatory Visit (INDEPENDENT_AMBULATORY_CARE_PROVIDER_SITE_OTHER): Admitting: Otolaryngology

## 2023-11-15 ENCOUNTER — Encounter (INDEPENDENT_AMBULATORY_CARE_PROVIDER_SITE_OTHER): Payer: Self-pay | Admitting: Otolaryngology

## 2023-11-15 VITALS — BP 116/73 | HR 72 | Ht 70.0 in | Wt 198.2 lb

## 2023-11-15 DIAGNOSIS — Z91198 Patient's noncompliance with other medical treatment and regimen for other reason: Secondary | ICD-10-CM

## 2023-11-15 DIAGNOSIS — R0982 Postnasal drip: Secondary | ICD-10-CM | POA: Diagnosis not present

## 2023-11-15 DIAGNOSIS — J342 Deviated nasal septum: Secondary | ICD-10-CM

## 2023-11-15 DIAGNOSIS — J3089 Other allergic rhinitis: Secondary | ICD-10-CM

## 2023-11-15 DIAGNOSIS — Z789 Other specified health status: Secondary | ICD-10-CM

## 2023-11-15 DIAGNOSIS — J329 Chronic sinusitis, unspecified: Secondary | ICD-10-CM

## 2023-11-15 DIAGNOSIS — Z87891 Personal history of nicotine dependence: Secondary | ICD-10-CM

## 2023-11-15 DIAGNOSIS — R0981 Nasal congestion: Secondary | ICD-10-CM | POA: Diagnosis not present

## 2023-11-15 DIAGNOSIS — J343 Hypertrophy of nasal turbinates: Secondary | ICD-10-CM

## 2023-11-15 DIAGNOSIS — G4733 Obstructive sleep apnea (adult) (pediatric): Secondary | ICD-10-CM | POA: Diagnosis not present

## 2023-11-15 MED ORDER — FLUTICASONE PROPIONATE 50 MCG/ACT NA SUSP: 2.0000 | Freq: Every day | NASAL | 6 refills | Status: AC

## 2023-11-15 MED ORDER — LEVOCETIRIZINE DIHYDROCHLORIDE 5 MG PO TABS: 5.0000 mg | ORAL_TABLET | Freq: Every evening | ORAL | 3 refills | Status: AC

## 2023-11-15 NOTE — Progress Notes (Addendum)
 ENT CONSULT:  Reason for Consult: OSA and CPAP intolerance (sleep study at [facility])   HPI: Discussed the use of AI scribe software for clinical note transcription with the patient, who gave verbal consent to proceed.  History of Present Illness Ralph Hurst is a 63 year old male with severe sleep apnea AHI of 41 on sleep study 02/2023 done at home, who presents for evaluation of CPAP tolerance issues. He was referred by Dr. Jesus to ensure there are no blockages affecting CPAP tolerance.  He was diagnosed with severe sleep apnea following a home sleep test conducted in January. He has been using a CPAP machine since the diagnosis. Initially, he experienced issues with the air pressure but has since adjusted and feels better overall. However, he occasionally struggles with the CPAP, prompting the referral to check for any blockages that might be affecting his ability to tolerate the device.  In the past six months, he has lost approximately forty pounds.  He has recently started experiencing nasal congestion and sneezing, which are new symptoms for him this year. He has been taking Allegra for these symptoms but has not used Flonase or saline sprays. No history of allergies prior to this year.   Records Reviewed:  Dr Jesus, Office 09/15/23 OSA (obstructive sleep apnea) He intermittently removes his CPAP during sleep, leading to fatigue. There is a potential need for further intervention to improve sleep apnea management. He will be referred to an ear, nose, and throat specialist for evaluation and potential procedure to enhance treatment. Obesity due to energy imbalance He has lost 35 pounds over seven months using semaglutide  (Ozempic ) 0.5 mg. Further weight loss and muscle maintenance are desired. Increasing the semaglutide  dose to 1 mg is discussed for better weight loss, kidney protection, and sugar control. If tolerated, the dose will be increased to 1 mg, with the option to revert to  0.5 mg if side effects occur. He is encouraged to use the TRX system for resistance training to maintain muscle mass and increase metabolic rate. Continued weight loss is promoted with a focus on reducing waist circumference rather than overall weight.    Past Medical History:  Diagnosis Date   Diabetes mellitus without complication (HCC)    Dizziness 06/19/2023   Current BP: 93/60 (06/19/2023), decreased from historical measurements  Recent BP trends: 108/60 (05/19/2023), 98/69 (05/03/2023)  Significant decline from baseline BP one year ago: 128/78 (06/01/2022)  Symptoms: dizziness with positional changes, especially when standing quickly  Contributing Factors:  Weight loss from Ozempic  therapy (approximately 1 lb/week)  Recent initiation of Jardiance  (causes    Essential hypertension 09/14/2006   Qualifier: Diagnosis of   By: Krystal, RN, Leeroy       Hyperlipidemia    Hypertension    Macrocytic anemia 12/01/2022   (12/01/2022) MCV elevated at 101.4 fl with B12 deficiency. Hemoglobin currently normal at 15.3 due to coexisting polycythemia Monitor with B12 supplementation.     Need for immunization against influenza 10/11/2017   Polycythemia    Polycythemia, secondary 09/14/2006   Lab Results      Component    Value    Date/Time           HGB    15.9    05/19/2023 09:56 AM           HGB    15.3    12/01/2022 09:03 AM           HGB    14.9    05/25/2022 08:30  AM           HGB    17.4 (H)    10/11/2017 10:38 AM           HGB    17.2 (H)    06/10/2016 09:11 AM  Takes 2 baby aspirin  daily     Secondary Polycythemia (D75.1) #ChronicCondition #OSARelated  STATUS: 05/20/2023: Long   Routine general medical examination at a health care facility 10/11/2017   Sleep stage dysfunction 10/17/2016   Tick bite of abdomen 01/19/2023    History reviewed. No pertinent surgical history.  Family History  Problem Relation Age of Onset   Kidney disease Mother    Hyperlipidemia Mother    Hearing loss Mother    COPD  Mother    Hypertension Mother    Diabetes Mother    Asthma Mother    Sleep apnea Mother    Sleep apnea Father    Hyperlipidemia Father    Asthma Father    Hypertension Father    Hypertension Sister    Hyperlipidemia Sister    Diabetes Sister    Hypertension Brother    Hearing loss Brother    COPD Brother    Sleep apnea Son     Social History:  reports that he has quit smoking. His smoking use included cigarettes. He has never used smokeless tobacco. He reports current alcohol use of about 12.0 standard drinks of alcohol per week. He reports that he does not use drugs.  Allergies: No Known Allergies  Medications: I have reviewed the patient's current medications.  The PMH, PSH, Medications, Allergies, and SH were reviewed and updated.  ROS: Constitutional: Negative for fever, weight loss and weight gain. Cardiovascular: Negative for chest pain and dyspnea on exertion. Respiratory: Is not experiencing shortness of breath at rest. Gastrointestinal: Negative for nausea and vomiting. Neurological: Negative for headaches. Psychiatric: The patient is not nervous/anxious  Blood pressure 116/73, pulse 72, height 5' 10 (1.778 m), weight 198 lb 3.2 oz (89.9 kg), SpO2 95%. Body mass index is 28.44 kg/m.  PHYSICAL EXAM:  Exam: General: Well-developed, well-nourished Communication and Voice: Clear pitch and clarity Respiratory Respiratory effort: Equal inspiration and expiration without stridor Cardiovascular Peripheral Vascular: Warm extremities with equal color/perfusion Eyes: No nystagmus with equal extraocular motion bilaterally Neuro/Psych/Balance: Patient oriented to person, place, and time; Appropriate mood and affect; Gait is intact with no imbalance; Cranial nerves I-XII are intact Head and Face Inspection: Normocephalic and atraumatic without mass or lesion Palpation: Facial skeleton intact without bony stepoffs Salivary Glands: No mass or tenderness Facial Strength:  Facial motility symmetric and full bilaterally ENT Pinna: External ear intact and fully developed External canal: Canal is patent with intact skin Tympanic Membrane: Clear and mobile External Nose: No scar or anatomic deformity Internal Nose: Septum is slightly deviated to the right but nasal passages are patent. No polyp, or purulence. Mucosal edema and erythema present.  Bilateral inferior turbinate hypertrophy.  Lips, Teeth, and gums: Mucosa and teeth intact and viable TMJ: No pain to palpation with full mobility Oral cavity/oropharynx: No erythema or exudate, no lesions present Nasopharynx: No mass or lesion with intact mucosa Hypopharynx: Intact mucosa without pooling of secretions Larynx Glottic: Full true vocal cord mobility without lesion or mass Supraglottic: Normal appearing epiglottis and AE folds Interarytenoid Space: Moderate pachydermia&edema Subglottic Space: Patent without lesion or edema Neck Neck and Trachea: Midline trachea without mass or lesion Thyroid : No mass or nodularity Lymphatics: No lymphadenopathy  Procedure: Preoperative diagnosis: OSA and CPAP  intolerance   Postoperative diagnosis:   Same  Procedure: Flexible fiberoptic laryngoscopy  Surgeon: Raybon Conard, MD  Anesthesia: Topical lidocaine and Afrin Complications: None Condition is stable throughout exam  Indications and consent:  The patient presents to the clinic with above symptoms. Indirect laryngoscopy view was incomplete. Thus it was recommended that they undergo a flexible fiberoptic laryngoscopy. All of the risks, benefits, and potential complications were reviewed with the patient preoperatively and verbal informed consent was obtained.  Procedure: The patient was seated upright in the clinic. Topical lidocaine and Afrin were applied to the nasal cavity. After adequate anesthesia had occurred, I then proceeded to pass the flexible telescope into the nasal cavity. The nasal cavity was  patent without rhinorrhea or polyp. The nasopharynx was also patent without mass or lesion. The base of tongue was visualized and was normal. There were no signs of pooling of secretions in the piriform sinuses. The true vocal folds were mobile bilaterally. There were no signs of glottic or supraglottic mucosal lesion or mass. There was moderate interarytenoid pachydermia and post cricoid edema. The telescope was then slowly withdrawn and the patient tolerated the procedure throughout.   PROCEDURE NOTE: nasal endoscopy  Preoperative diagnosis: chronic sinusitis symptoms  Postoperative diagnosis: same  Procedure: Diagnostic nasal endoscopy (68768)  Surgeon: Elena Larry, M.D.  Anesthesia: Topical lidocaine and Afrin  H&P REVIEW: The patient's history and physical were reviewed today prior to procedure. All medications were reviewed and updated as well. Complications: None Condition is stable throughout exam Indications and consent: The patient presents with symptoms of chronic sinusitis not responding to previous therapies. All the risks, benefits, and potential complications were reviewed with the patient preoperatively and informed consent was obtained. The time out was completed with confirmation of the correct procedure.   Procedure: The patient was seated upright in the clinic. Topical lidocaine and Afrin were applied to the nasal cavity. After adequate anesthesia had occurred, the rigid nasal endoscope was passed into the nasal cavity. The nasal mucosa, turbinates, septum, and sinus drainage pathways were visualized bilaterally. This revealed no purulence or significant secretions that might be cultured. There were no polyps or sites of significant inflammation. The mucosa was intact and there was no crusting present. The scope was then slowly withdrawn and the patient tolerated the procedure well. There were no complications or blood loss.   Studies Reviewed: Home sleep study  02/28/23  This home sleep test demonstrates severe obstructive sleep apnea with a total AHI of 41.2/hour and O2 nadir of 73% with significant time below or at 88% saturation of  over 30 minutes for the study, indicating nocturnal hypoxemia.     Assessment/Plan: Encounter Diagnoses  Name Primary?   Obstructive sleep apnea Yes   Intolerance of continuous positive airway pressure (CPAP) ventilation    Chronic nasal congestion    Environmental and seasonal allergies    Post-nasal drip    Nasal septal deviation    Hypertrophy of both inferior nasal turbinates     Assessment and Plan Assessment & Plan Obstructive sleep apnea, severe CPAP intolerance Severe obstructive sleep apnea managed with CPAP. He feels his tolerance of the mask is better. Nasal endoscopy without significant narrowing, had mild septal deviation but both nasal passages were patient. He had significant nasal congestion. Flexible scope without significant tonsillar hypertrophy.  - Continue CPAP therapy. - Provided information on surgical options if CPAP becomes intolerable.  Chronic nasal congestion environmental allergies post-nasal drainage Mild nasal congestion possibly related to CPAP.  New sneezing bouts, likely 2/2 environmental allergies. - Prescribed Flonase twice daily and Xyzal 5 mg daily - Recommend saline nasal sprays a few times a day. - consider nasal saline rinses       Thank you for allowing me to participate in the care of this patient. Please do not hesitate to contact me with any questions or concerns.   Elena Larry, MD Otolaryngology [facility] Phone: [phone] Fax: [phone]    11/15/2023, 2:58 PM

## 2023-11-27 ENCOUNTER — Other Ambulatory Visit (HOSPITAL_COMMUNITY): Payer: Self-pay

## 2023-12-09 DIAGNOSIS — G4733 Obstructive sleep apnea (adult) (pediatric): Secondary | ICD-10-CM | POA: Diagnosis not present

## 2023-12-20 DIAGNOSIS — Z419 Encounter for procedure for purposes other than remedying health state, unspecified: Secondary | ICD-10-CM | POA: Diagnosis not present

## 2024-01-18 ENCOUNTER — Ambulatory Visit: Admitting: Dermatology

## 2024-01-18 ENCOUNTER — Encounter: Payer: Self-pay | Admitting: Dermatology

## 2024-01-18 VITALS — BP 98/76 | HR 90

## 2024-01-18 DIAGNOSIS — W908XXA Exposure to other nonionizing radiation, initial encounter: Secondary | ICD-10-CM

## 2024-01-18 DIAGNOSIS — D0461 Carcinoma in situ of skin of right upper limb, including shoulder: Secondary | ICD-10-CM

## 2024-01-18 DIAGNOSIS — L814 Other melanin hyperpigmentation: Secondary | ICD-10-CM

## 2024-01-18 DIAGNOSIS — D1801 Hemangioma of skin and subcutaneous tissue: Secondary | ICD-10-CM

## 2024-01-18 DIAGNOSIS — L578 Other skin changes due to chronic exposure to nonionizing radiation: Secondary | ICD-10-CM

## 2024-01-18 DIAGNOSIS — D489 Neoplasm of uncertain behavior, unspecified: Secondary | ICD-10-CM

## 2024-01-18 DIAGNOSIS — Z1283 Encounter for screening for malignant neoplasm of skin: Secondary | ICD-10-CM

## 2024-01-18 DIAGNOSIS — L821 Other seborrheic keratosis: Secondary | ICD-10-CM

## 2024-01-18 DIAGNOSIS — L570 Actinic keratosis: Secondary | ICD-10-CM

## 2024-01-18 DIAGNOSIS — D229 Melanocytic nevi, unspecified: Secondary | ICD-10-CM

## 2024-01-18 NOTE — Progress Notes (Deleted)
° °  New Patient Visit   Subjective  Ralph Hurst is a 63 y.o. male who presents for the following: ***  Patient states he  has *** located at the {LOCATION ON BODY:21951} that he  would like to have examined. Patient reports the areas have been there for {NUMBER 1-10:22536} {Time; units week/month/year w plurals:19499}. He reports the areas {Actions; are/are not:16769} bothersome.Patient rates irritation {NUMBER 1-10:22536} out of 10. He states that the areas {ACTIONS; HAVE/HAVE NOT:19434} spread. Patient reports he  {HAS HAS WNU:81165} previously been treated for these areas. Patient *** Hx of bx. Patient *** family history of skin cancer(s).  The patient has spots, moles and lesions to be evaluated, some may be new or changing and the patient may have concern these could be cancer.   The following portions of the chart were reviewed this encounter and updated as appropriate: medications, allergies, medical history  Review of Systems:  No other skin or systemic complaints except as noted in HPI or Assessment and Plan.  Objective  Well appearing patient in no apparent distress; mood and affect are within normal limits.  ***A full examination was performed including scalp, head, eyes, ears, nose, lips, neck, chest, axillae, abdomen, back, buttocks, bilateral upper extremities, bilateral lower extremities, hands, feet, fingers, toes, fingernails, and toenails. All findings within normal limits unless otherwise noted below.  ***A focused examination was performed of the following areas: ***  Relevant exam findings are noted in the Assessment and Plan.    Assessment & Plan       Return in about 1 year (around 01/17/2025) for TBSE follow up .  I, Doyce Pan, CMA, am acting as scribe for Ralph CHRISTELLA HOLY, MD.   Documentation: I have reviewed the above documentation for accuracy and completeness, and I agree with the above.  Ralph CHRISTELLA HOLY, MD

## 2024-01-18 NOTE — Progress Notes (Signed)
 New Patient Visit   History of Present Illness Ralph Hurst is a 63 year old male who presents for a full body skin check. He was referred for a full body skin check.  He is concerned about a spot on his arm that his grandson finds concerning. The duration of the spot's presence is unclear. He has not undergone a skin biopsy before.  He has a history of significant sun exposure and sometimes uses sunscreen with SPF 30 or more, along with wearing a baseball cap. Some spots increase in size and become crusty over time, but they are not itchy.  His father had similar skin issues. He recalls having similar spots when he was working and sitting a lot, and he used to rest his arms on surfaces.  He self-administers B12 shots weekly, indicating he is accustomed to needles despite not liking them.  Patient present today for new patient visit for TBSE. Patient has not previously been treated by a dermatologist.Patient reports he  does not have hx of bx. Patient admits to family history of skin cancers. Patient reports throughout his lifetime has had severe sun exposure. Currently, patient reports if he  has excessive sun exposure, he  does apply sunscreen and/or wears protective coverings.  The following portions of the chart were reviewed this encounter and updated as appropriate: medications, allergies, medical history  Review of Systems:  No other skin or systemic complaints except as noted in HPI or Assessment and Plan.  Objective  Well appearing patient in no apparent distress; mood and affect are within normal limits.  A full examination was performed including scalp, head, eyes, ears, nose, lips, neck, chest, axillae, abdomen, back, buttocks, bilateral upper extremities, bilateral lower extremities, hands, feet, fingers, toes, fingernails, and toenails. All findings within normal limits unless otherwise noted below.   Relevant exam findings are noted in the Assessment and Plan.  0.5  hyperkaratotic papule  Right Forearm - Posterior 0.5 hyperkeratotic papule  Left Forearm - Posterior (2), Right Malar Cheek, Right Zygomatic Area Erythematous thin papules/macules with gritty scale.   Assessment & Plan   LENTIGINES, SEBORRHEIC KERATOSES, HEMANGIOMAS - Benign normal skin lesions - Benign-appearing - Call for any changes  MELANOCYTIC NEVI - Tan-brown and/or pink-flesh-colored symmetric macules and papules - Benign appearing on exam today - Observation - Call clinic for new or changing moles - Recommend daily use of broad spectrum spf 30+ sunscreen to sun-exposed areas.   ACTINIC DAMAGE - Chronic condition, secondary to cumulative UV/sun exposure - diffuse scaly erythematous macules with underlying dyspigmentation - Recommend daily broad spectrum sunscreen SPF 30+ to sun-exposed areas, reapply every 2 hours as needed.  - Staying in the shade or wearing long sleeves, sun glasses (UVA+UVB protection) and wide brim hats (4-inch brim around the entire circumference of the hat) are also recommended for sun protection.  - Call for new or changing lesions.  SKIN CANCER SCREENING PERFORMED TODAY NEOPLASM OF UNCERTAIN BEHAVIOR Right Forearm - Posterior - Skin / nail biopsy Type of biopsy: tangential   Informed consent: discussed and consent obtained   Timeout: patient name, date of birth, surgical site, and procedure verified   Procedure prep:  Patient was prepped and draped in usual sterile fashion Prep type:  Isopropyl alcohol Anesthesia: the lesion was anesthetized in a standard fashion   Anesthetic:  1% lidocaine w/ epinephrine 1-100,000 buffered w/ 8.4% NaHCO3 Instrument used: flexible razor blade   Hemostasis achieved with: pressure and aluminum chloride   Outcome: patient tolerated  procedure well   Post-procedure details: sterile dressing applied and wound care instructions given   Dressing type: bandage and petrolatum    Specimen 1 - Surgical  pathology Differential Diagnosis: R/O NMSC  Check Margins: No AK (ACTINIC KERATOSIS) (4) Left Forearm - Posterior (2), Right Malar Cheek, Right Zygomatic Area - Destruction of lesion - Left Forearm - Posterior (2), Right Malar Cheek, Right Zygomatic Area Complexity: simple   Destruction method: cryotherapy   Informed consent: discussed and consent obtained   Timeout:  patient name, date of birth, surgical site, and procedure verified Lesion destroyed using liquid nitrogen: Yes   Region frozen until ice ball extended beyond lesion: Yes   Outcome: patient tolerated procedure well with no complications   Post-procedure details: wound care instructions given    ACTINIC SKIN DAMAGE   MULTIPLE BENIGN NEVI   LENTIGINES   SEBORRHEIC KERATOSIS   CHERRY ANGIOMA    Return in about 1 year (around 01/17/2025) for TBSE follow up .  I, Doyce Pan, CMA, am acting as scribe for Ralph CHRISTELLA HOLY, MD.   Documentation: I have reviewed the above documentation for accuracy and completeness, and I agree with the above.  Ralph CHRISTELLA HOLY, MD

## 2024-01-18 NOTE — Patient Instructions (Addendum)

## 2024-01-22 ENCOUNTER — Ambulatory Visit: Payer: Self-pay | Admitting: Dermatology

## 2024-01-22 LAB — SURGICAL PATHOLOGY

## 2024-01-23 NOTE — Progress Notes (Signed)
 Spoke with HIPAA spouse and gave her bx results and treatment recommendations

## 2024-01-29 DIAGNOSIS — G4733 Obstructive sleep apnea (adult) (pediatric): Secondary | ICD-10-CM | POA: Diagnosis not present

## 2024-01-31 ENCOUNTER — Encounter: Payer: Self-pay | Admitting: Dermatology

## 2024-01-31 ENCOUNTER — Ambulatory Visit: Admitting: Dermatology

## 2024-01-31 VITALS — BP 109/66 | HR 88

## 2024-01-31 DIAGNOSIS — D0461 Carcinoma in situ of skin of right upper limb, including shoulder: Secondary | ICD-10-CM

## 2024-01-31 DIAGNOSIS — D099 Carcinoma in situ, unspecified: Secondary | ICD-10-CM

## 2024-01-31 NOTE — Patient Instructions (Signed)

## 2024-01-31 NOTE — Progress Notes (Signed)
 "  Follow-Up Visit   Subjective  Ralph Hurst is a 63 y.o. male who presents for the following: Excision of an Squamous Carcinoma in Situ on the right forearm posterior, biopsied by Dr. Corey.  The following portions of the chart were reviewed this encounter and updated as appropriate: medications, allergies, medical history  Review of Systems:  No other skin or systemic complaints except as noted in HPI or Assessment and Plan.  Objective  Well appearing patient in no apparent distress; mood and affect are within normal limits.  A focused examination was performed of the following areas: Right forearm posterior Relevant physical exam findings are noted in the Assessment and Plan.   Right Forearm - Posterior Pink scaly plaque with hemorrhagic crusting   Assessment & Plan   SQUAMOUS CELL CARCINOMA IN SITU Right Forearm - Posterior - Skin excision  Lesion length (cm):  3.1 Lesion width (cm):  2.2 Margin per side (cm):  0.5 Total excision diameter (cm):  4.1 Informed consent: discussed and consent obtained   Timeout: patient name, date of birth, surgical site, and procedure verified   Procedure prep:  Patient was prepped and draped in usual sterile fashion Prep type:  Chlorhexidine Anesthesia: the lesion was anesthetized in a standard fashion   Anesthetic:  1% lidocaine w/ epinephrine 1-100,000 buffered w/ 8.4% NaHCO3 Instrument used: #15 blade   Hemostasis achieved with: suture, pressure and electrodesiccation   Hemostasis achieved with comment:  4.0 vicryl with dermabond and steri strips Outcome: patient tolerated procedure well with no complications   Post-procedure details: sterile dressing applied and wound care instructions given   Dressing type: bandage and pressure dressing   Additional details:  Final length  - Skin repair Complexity:  Complex Final length (cm):  8 Informed consent: discussed and consent obtained   Timeout: patient name, date of birth,  surgical site, and procedure verified   Procedure prep:  Patient was prepped and draped in usual sterile fashion Prep type:  Chlorhexidine Anesthesia: the lesion was anesthetized in a standard fashion   Anesthetic:  1% lidocaine w/ epinephrine 1-100,000 buffered w/ 8.4% NaHCO3 Reason for type of repair: reduce tension to allow closure, allow closure of the large defect and preserve normal anatomy   Undermining: area extensively undermined   Subcutaneous layers (deep stitches):  Suture size:  3-0 and 4-0 Suture type: Vicryl (polyglactin 910)   Stitches:  Buried vertical mattress Fine/surface layer approximation (top stitches):  Suture type: cyanoacrylate tissue glue   Hemostasis achieved with: suture, pressure and electrodesiccation Outcome: patient tolerated procedure well with no complications   Post-procedure details: sterile dressing applied and wound care instructions given   Dressing type: bandage and pressure dressing    Specimen 1 - Surgical pathology Differential Diagnosis: CIS [id number] Check Margins: No  The surgical wound was then cleaned, prepped, and re-anesthetized as above. Wound edges were undermined extensively along at least one entire edge and at a distance equal to or greater than the width of the defect (see wound defect size above) in order to achieve closure and decrease wound tension and anatomic distortion. Redundant tissue repair including standing cone removal was performed. Hemostasis was achieved with electrocautery. Subcutaneous and epidermal tissues were approximated with the above sutures. The surgical site was then lightly scrubbed with sterile, saline-soaked gauze. Steri-strips were applied, and the area was then bandaged using Vaseline ointment, non-adherent gauze, gauze pads, and tape to provide an adequate pressure dressing. The patient tolerated the procedure well, was given detailed written  and verbal wound care instructions, and was discharged in good  condition.   The patient will follow-up: PRN.  Return if symptoms worsen or fail to improve.  I, Darice Smock, CMA, am acting as scribe for [name], MD.   Documentation: I have reviewed the above documentation for accuracy and completeness, and I agree with the above.  [name], MD  "

## 2024-02-05 LAB — SURGICAL PATHOLOGY

## 2024-02-09 ENCOUNTER — Ambulatory Visit: Payer: Self-pay | Admitting: Dermatology

## 2024-04-17 ENCOUNTER — Encounter: Admitting: Internal Medicine

## 2024-05-02 ENCOUNTER — Ambulatory Visit: Admitting: Neurology

## 2025-01-21 ENCOUNTER — Ambulatory Visit: Admitting: Dermatology
